# Patient Record
Sex: Female | Born: 1970 | Race: White | Hispanic: No | Smoking: Never smoker
Health system: Southern US, Community
[De-identification: ages and names within clinical notes are randomized; demographics above are authoritative.]

## PROBLEM LIST (undated history)

## (undated) DIAGNOSIS — Z9289 Personal history of other medical treatment: Secondary | ICD-10-CM

## (undated) DIAGNOSIS — F329 Major depressive disorder, single episode, unspecified: Secondary | ICD-10-CM

## (undated) DIAGNOSIS — T7840XA Allergy, unspecified, initial encounter: Secondary | ICD-10-CM

## (undated) DIAGNOSIS — I10 Essential (primary) hypertension: Secondary | ICD-10-CM

## (undated) DIAGNOSIS — F419 Anxiety disorder, unspecified: Secondary | ICD-10-CM

## (undated) DIAGNOSIS — Q210 Ventricular septal defect: Secondary | ICD-10-CM

## (undated) DIAGNOSIS — F32A Depression, unspecified: Secondary | ICD-10-CM

## (undated) DIAGNOSIS — D649 Anemia, unspecified: Secondary | ICD-10-CM

## (undated) DIAGNOSIS — R011 Cardiac murmur, unspecified: Secondary | ICD-10-CM

## (undated) HISTORY — DX: Anemia, unspecified: D64.9

## (undated) HISTORY — DX: Anxiety disorder, unspecified: F41.9

## (undated) HISTORY — DX: Ventricular septal defect: Q21.0

## (undated) HISTORY — DX: Personal history of other medical treatment: Z92.89

## (undated) HISTORY — PX: OTHER SURGICAL HISTORY: SHX169

## (undated) HISTORY — DX: Allergy, unspecified, initial encounter: T78.40XA

## (undated) HISTORY — DX: Essential (primary) hypertension: I10

## (undated) HISTORY — DX: Depression, unspecified: F32.A

## (undated) HISTORY — PX: WISDOM TOOTH EXTRACTION: SHX21

---
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
## 2005-12-08 ENCOUNTER — Ambulatory Visit: Payer: Self-pay | Admitting: Family Medicine

## 2006-08-29 ENCOUNTER — Ambulatory Visit: Payer: Self-pay | Admitting: Family Medicine

## 2006-10-31 DIAGNOSIS — J45909 Unspecified asthma, uncomplicated: Secondary | ICD-10-CM

## 2007-08-24 ENCOUNTER — Ambulatory Visit: Payer: Self-pay | Admitting: Cardiovascular Disease

## 2007-10-01 ENCOUNTER — Ambulatory Visit: Payer: Self-pay

## 2007-10-01 ENCOUNTER — Encounter: Payer: Self-pay | Admitting: Cardiovascular Disease

## 2007-10-09 ENCOUNTER — Ambulatory Visit: Payer: Self-pay | Admitting: Internal Medicine

## 2007-10-09 DIAGNOSIS — L723 Sebaceous cyst: Secondary | ICD-10-CM

## 2007-11-05 ENCOUNTER — Ambulatory Visit: Payer: Self-pay | Admitting: Cardiovascular Disease

## 2007-11-05 LAB — CONVERTED CEMR LAB
BUN: 7 mg/dL (ref 6–23)
CO2: 28 meq/L (ref 19–32)
Chloride: 111 meq/L (ref 96–112)
GFR calc Af Amer: 103 mL/min
Glucose, Bld: 88 mg/dL (ref 70–99)
Potassium: 4 meq/L (ref 3.5–5.1)

## 2007-12-18 ENCOUNTER — Telehealth (INDEPENDENT_AMBULATORY_CARE_PROVIDER_SITE_OTHER): Payer: Self-pay | Admitting: *Deleted

## 2007-12-19 ENCOUNTER — Ambulatory Visit: Payer: Self-pay | Admitting: Family Medicine

## 2007-12-25 ENCOUNTER — Telehealth: Payer: Self-pay | Admitting: Family Medicine

## 2008-05-14 ENCOUNTER — Ambulatory Visit: Payer: Self-pay | Admitting: Cardiovascular Disease

## 2008-05-14 LAB — CONVERTED CEMR LAB
BUN: 9 mg/dL (ref 6–23)
CO2: 27 meq/L (ref 19–32)
Chloride: 108 meq/L (ref 96–112)
Creatinine, Ser: 0.7 mg/dL (ref 0.4–1.2)
Glucose, Bld: 87 mg/dL (ref 70–99)
Potassium: 4.1 meq/L (ref 3.5–5.1)

## 2008-05-20 ENCOUNTER — Ambulatory Visit: Payer: Self-pay | Admitting: Cardiovascular Disease

## 2008-05-20 ENCOUNTER — Ambulatory Visit (HOSPITAL_COMMUNITY): Admission: RE | Admit: 2008-05-20 | Discharge: 2008-05-20 | Payer: Self-pay | Admitting: Cardiovascular Disease

## 2008-06-03 ENCOUNTER — Ambulatory Visit: Payer: Self-pay | Admitting: Family Medicine

## 2008-06-03 DIAGNOSIS — L509 Urticaria, unspecified: Secondary | ICD-10-CM

## 2008-06-04 ENCOUNTER — Telehealth: Payer: Self-pay | Admitting: Family Medicine

## 2009-02-10 ENCOUNTER — Telehealth: Payer: Self-pay | Admitting: Cardiovascular Disease

## 2009-02-12 ENCOUNTER — Ambulatory Visit: Payer: Self-pay | Admitting: Family Medicine

## 2009-02-12 DIAGNOSIS — N61 Mastitis without abscess: Secondary | ICD-10-CM | POA: Insufficient documentation

## 2010-03-08 ENCOUNTER — Encounter: Payer: Self-pay | Admitting: Cardiovascular Disease

## 2010-03-10 ENCOUNTER — Encounter: Payer: Self-pay | Admitting: Family Medicine

## 2010-03-11 ENCOUNTER — Other Ambulatory Visit: Payer: Self-pay | Admitting: Family Medicine

## 2010-03-11 ENCOUNTER — Ambulatory Visit
Admission: RE | Admit: 2010-03-11 | Discharge: 2010-03-11 | Payer: Self-pay | Source: Home / Self Care | Attending: Family Medicine | Admitting: Family Medicine

## 2010-03-12 DIAGNOSIS — L820 Inflamed seborrheic keratosis: Secondary | ICD-10-CM | POA: Insufficient documentation

## 2010-03-18 NOTE — Assessment & Plan Note (Signed)
Summary: check suspicious growth on pts back/cjr   Vital Signs:  Patient profile:   40 year old female Height:      63 inches Weight:      168 pounds BMI:     29.87 Temp:     98.3 degrees F oral BP sitting:   120 / 88  (left arm) Cuff size:   regular  Vitals Entered By: Kern Reap CMA Duncan Dull) (March 11, 2010 1:57 PM)  Procedure Note Last Tetanus: Historical (12/08/2005)  Mole Biopsy/Removal: Indication: inflamed lesion Consent signed: yes  Procedure # 1: elliptical incision with 2 mm margin    Size (in cm): 1.0 x 1.0    Region: posterior    Location: back-mid-right    Instrument used: #15 blade    Anesthesia: 1% lidocaine w/epinephrine    Closure: cautery  Cleaned and prepped with: alcohol Wound dressing: neosporin and bandaid  CC: lession on back   CC:  lession on back.  History of Present Illness: mckinzi is a 40 year old female, who comes in today for removal of a irritated lesion on her back.  Her boyfriend noticed this lesion about 3 or 4 weeks ago.  Her father was diagnosed to have metastatic melanoma about a year and a half ago.  Review of systems negative except she did spend some [place] on [address] and she does have light skin and blue eyes  Allergies: 1)  ! Tetracycline 2)  ! Erythromycin 3)  ! * Ct Dye   Complete Medication List: 1)  Ventolin Hfa 108 (90 Base) Mcg/act Aers (Albuterol sulfate) .... 2 puffs every 4 hours as needed 2)  Prednisone 20 Mg Tabs (Prednisone) .... Uad 3)  Keflex 500 Mg Caps (Cephalexin) .... 2 by mouth two times a day  Other Orders: Shave Skin Lesion 1.1-2.0 cm/trunk/arm/leg (91478)   Orders Added: 1)  Shave Skin Lesion 1.1-2.0 cm/trunk/arm/leg [[id number]]

## 2010-03-24 NOTE — Miscellaneous (Signed)
Summary: Consent to Office Procedure  Consent to Office Procedure   Imported By: Maryln Gottron 03/19/2010 12:29:57  _____________________________________________________________________  External Attachment:    Type:   Image     Comment:   External Document

## 2010-05-08 ENCOUNTER — Other Ambulatory Visit: Payer: Self-pay | Admitting: Family Medicine

## 2010-05-10 NOTE — Telephone Encounter (Signed)
Keflex for what reason??????????

## 2010-06-29 NOTE — Assessment & Plan Note (Signed)
[facility]                            CARDIOLOGY OFFICE NOTE   CHERELLE, MIDKIFF                            MRN:          [id number]  DATE:08/24/2007                            DOB:          20-Jun-1970    HISTORY OF PRESENT ILLNESS:  Mrs. Mangini is a pleasant patient that is  self-referred and also referred by Dr. Alonza Smoker for murmur and a VSD.  She is 40 years old.  Date of birth [birth year].   She has lifelong history of a murmur.  She carries a diagnosis of VSD.  She has not had an echo in 7 or 8 years, and she was told that it was  small and did not need surgery.   She continues to go to the dentist on a regular basis.  She continues to  use SBE prophylaxis.  Despite the new recommendations, I did tell the  patient that I do prefer VSD patients to be on antibiotic prophylaxis,  since they have a high velocity jet and significant ability to denude  the endothelium in the right ventricle.   From a cardiac standpoint, she has been stable.  She kick-boxes twice a  week.  She has not had PND, orthopnea, palpitations, chest pain, or  presyncope.   She has not had any recurrent fevers __________ SBE.   She seems to understand the natural history of her VSD.  I showed her a  heart model and maneuvered the parallel circulations in the right and  left side of the heart and showed her where her VSD likely is.  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  I do not have any  records from [facility] on her.   REVIEW OF SYSTEMS:  Remarkable for some chronic back pain, but otherwise  no significant problems.  She has had some moles removed.   PAST MEDICAL HISTORY:  Otherwise remarkable for chronic back problems  with what sounds like scoliosis with some sun sensitivity and rosacea to  the cheeks.   Otherwise negative.   The patient is allergic to TETRACYCLINE and ERYTHROMYCIN.  She takes no  medications regularly.   SOCIAL HISTORY:  The  patient is divorced.  She has no children.  She is  a Emergency planning/management officer for [employer].  She kick-boxes twice a  week.  She does not smoke or drink.   She walks on a regular basis and does 2 hours of kick boxing a day.   Family history is noncontributory.  Mother and father are still alive.  She only takes iron on occasion.   PHYSICAL EXAMINATION:  GENERAL:  The patient's exam is remarkable for  healthy-appearing middle aged white female in no distress.  She does have  a bit of injection to her cheeks; I do not think this reflects mitral  disease.  VITAL SIGNS:  Blood pressure is 110/70, pulses 60 and regular,  respiratory rate 14, afebrile.  HEENT:  Unremarkable.  NECK:  Carotids normal without bruit.  No lymphadenopathy and no JVP  elevation.  CHEST:  She  has a loud VSD murmur that radiates throughout the chest.  PMI is normal.  No RV heaves.  ABDOMEN:  Benign. Bowel sounds positive.  No AAA.  No tenderness.  No  hepatosplenomegaly or hepatojugular reflux.  EXTREMITIES:  Distal pulses are intact.  No edema.  NEURO:  Nonfocal.  SKIN:  Warm and dry.  MUSCULOSKELETAL:  No muscular weakness.  She does have significant  scoliosis and spinal deformity.   EKG shows a sinus rhythm with rightward axis.   IMPRESSION:  1. Murmur.  Followup 2D echocardiogram.  2. Ventricular septal defect.  No evidence of Eisenmenger syndrome or      right heart problems.  The patient will continue subacute bacterial      endocarditis prophylaxis per my request.  Natural history of the      ventriculoseptal defect was discussed with the patient.  Warning      signs in regards to presyncope, dyspnea, and chest pain were also      discussed.  She will continue to try to improve her activity levels      with kick-boxing.  3. Dental hygiene.  Continue to follow up with the dentist twice a      year.  Her dentition seems to be in good shape.  Continue subacute      bacterial endocarditis  prophylaxis.  4. Spinal deformities.  Continue to take p.r.n. nonsteroidals.  Follow      up with primary care M.D.   I will see Markeita on a yearly basis as along as her echo was __________  which should be a small perimembranous VSD with no right-sided  enlargement or evidence of Eisenmenger syndrome.     Noralyn Pick. Eden Emms, MD, [facility]  Electronically Signed    PCN/MedQ  DD: 08/24/2007  DT: 08/25/2007  Job #: [id number]   cc:   Tinnie Gens A. Tawanna Cooler, MD

## 2010-07-02 NOTE — Assessment & Plan Note (Signed)
[facility] OFFICE NOTE   GEORGIAN, MCCLORY                            MRN:          [id number]  DATE:12/08/2005                            DOB:          Sep 17, 1970    This is the first [facility] visit for this 40 year old, divorced, white  female who comes in today for new patient for evaluation of 3 issues.   PAST MEDICAL HISTORY:  None.   PAST HOSPITALIZATIONS:  None.   She had outpatient surgery, cyst on her left wrist.   PAST ILLNESSES:  None.   PAST INJURIES:  None.   DRUG ALLERGIES:  TETRACYCLINE GIVES HER HIVES.  SHE HAS GI UPSET AND  VOMITING TO ERYTHROMYCIN.   She is a nonsmoker.  She takes an occasional drink of alcohol.   She is not taking medicines on a regular basis.   The 3 issues she wants to discuss are moles.  She has some concerns about  some of her moles.  She also wants to discuss the fact that her mother and  aunt have been diagnosed with sarcoidosis, and what that means to her.  She  also has a bump on her face she is concerned about.   REVIEW OF SYMPTOMS:  HEENT:  Pertinent.  She had lacerations to her skull x3  from injury as a child.  No neurologic sequelae.  She does wear reading and  distance glasses.  She gets regular dental care.  CARDIOPULMONARY:  Significant, in she was diagnosed of a VSD at birth.  She is followed by  Cardiology in [place]; just recently moved here.  We will get her set up  to see Dr. Geralynn Rile for evaluation.  GI:  Negative.  GU:  Negative.  GYN:  Gravida 0, para 0.  CPX by GYN December 2005 in [place], including Pap,  was negative.  ENDOCRINE:  Negative.  Weight steady.  MUSCULOSKELETAL:  Negative, except she says she has some defects in some of the bones in her  back.  She occasionally has to see a chiropractor for back pain.  VASCULAR:  Negative.  She does have asthma that is related to cold and exercise.  She  uses an occasional shot  of Albuterol.  Rest of the review of systems  negative.   SOCIAL HISTORY:  She is originally from [place], went to [place].  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  She is divorced, no kids.   FAMILY HISTORY:  Dad is in his 63's, had 1st MI at 69.  He is a smoker.  Hypertension and high cholesterol.  Mother is 29 and has a history of  sarcoidosis, otherwise in good health.  One brother in good health, no  problems.   IMMUNIZATIONS:  Last tetanus unknown, probably more than 10 years.  We will  give her a tetanus booster today.   PHYSICAL EXAMINATION:  VITAL SIGNS:  Height 5 foot 6 inches.  Weight 152.  Pulse is 70 and regular.  Temperature was  98.  BP not recorded.  GENERAL:  She is a well-developed, well-nourished, white female, in no acute  distress.  SKIN:  Was all normal.  She has a garden variety of freckles, moles,  seborrheic keratoses, skin tags and capillary hemangioma, all of which were  normal.  She has what she thought was a mole on her left cheek, which is a  sebaceous cyst.  After a prep, the plug was removed and the sebaceous was  excised via manual pressure.  CARDIAC:  Shows a loud to and fro murmur heard throughout the precordium  consistent with a VSD.   Patient was given an adult DT to catch her up on her vaccinations.  She was  given a slip for a chest x-ray for screening for sarcoidosis, although she  is asymptomatic.  She was reassured that all of her moles were normal.  The  sebaceous cyst was excised.  She will be set up to see Dr. Geralynn Rile for  evaluation of the VSD.   She has a history of exercise and cold induced asthma.  Continue the  Albuterol prophylactically.   Patient was also advised according to the new criteria, she also falls into  one of the four categories; i.e., structural cardiac defect that would  require continued prophylaxis for any dental work, etc.  Patient understood  and  acknowledges that.    ______________________________  Eugenio Hoes. Tawanna Cooler, MD    JAT/MedQ  DD: 12/08/2005  DT: 12/09/2005  Job #: [id number]   cc:   Salvadore Farber, MD

## 2010-07-02 NOTE — Letter (Signed)
November 30, 2007     RE:  Elizabeth, Lozano  MRN:  [id number]  /  DOB:  1970-02-17   To Whom It May Concern:   This letter is written on behalf of Elizabeth Lozano, member ID [id number].  This is the second letter I have written in regards to scheduling the  patient for a cardiac CT.   The patient was initially referred to me for history of a congenital  heart condition, which included a ventricular septal defect.  The  patient has a loud murmur on exam.  We did a 2-D echocardiogram, which  documented a ventricular septal defect, but also questioned whether she  had an additional left-to-right shunt in the form of a coronary fistula  to the RV outflow tract.  This is an extremely important distinction to  make in regards to her long-term prognosis having 2 separate etiologies  to the left-to-right shunt would make her prognosis worse and give her  much higher risk of pulmonary hypertension and Eisenmenger syndrome.  The patient does not want to have invasive heart cath.  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  Analysis can be made with cardiac CT.  She  is an excellent candidate for this being fairly thin and having a low  resting heart rate.   Unfortunately, we tried to get this cardiac CT approved and it was  denied.  Clearly the indications from the [employer] of Cardiology  in the American Medical Association indicate that the congenital heart  anomalies are a satisfactory indication for cardiac CT and in particular  coronary artery fistula is a criteria for ordering one.   Unfortunately, we tried to call [employer] at the [phone] number.  There  was no way to actually access a physician to have a peer-to-peer  conference.   I am writing this letter to [facility] to request approval for cardiac CTA.   I will also make formal note that I will hold  Monia Pouch personally  responsible for any deleterious consequences to her health care in the  next 50 years from pulmonary hypertension, shortness of breath, or long-  term cardiac complications due to [employer]'s inability to allow her to have  a appropriate test to diagnose her cardiac condition.   I would appreciate [employer] getting back to assist as soon as possible in  regards to approving this cardiac CTA.      Sincerely,      Noralyn Pick. Eden Emms, MD, [facility]  Electronically Signed    PCN/MedQ  DD: 11/30/2007  DT: 12/01/2007  Job #: [id number]   CC:    [employer]

## 2010-12-10 ENCOUNTER — Ambulatory Visit (INDEPENDENT_AMBULATORY_CARE_PROVIDER_SITE_OTHER): Payer: BC Managed Care – PPO | Admitting: Family Medicine

## 2010-12-10 ENCOUNTER — Encounter: Payer: Self-pay | Admitting: Family Medicine

## 2010-12-10 VITALS — BP 118/80 | HR 96 | Temp 98.7°F | Wt 166.0 lb

## 2010-12-10 DIAGNOSIS — Z9109 Other allergy status, other than to drugs and biological substances: Secondary | ICD-10-CM

## 2010-12-10 DIAGNOSIS — J45901 Unspecified asthma with (acute) exacerbation: Secondary | ICD-10-CM

## 2010-12-10 DIAGNOSIS — J309 Allergic rhinitis, unspecified: Secondary | ICD-10-CM

## 2010-12-10 MED ORDER — HYDROCODONE-HOMATROPINE 5-1.5 MG/5ML PO SYRP: 5.0000 mL | ORAL_SOLUTION | ORAL | Status: AC | PRN

## 2010-12-10 MED ORDER — PREDNISONE (PAK) 10 MG PO TABS: ORAL_TABLET | ORAL | Status: DC

## 2010-12-10 NOTE — Progress Notes (Signed)
  Subjective:    Patient ID: Elizabeth Lozano, female    DOB: 03/28/1970, 40 y.o.   MRN: [id number]  HPI Here for one month of constant PND, tickling in the throat, chest congestion,and a dry cough. No ST or sinus pressure or fever. She is using an OTC antihistamine and saline nasal sprays.    Review of Systems  Constitutional: Negative.   HENT: Positive for postnasal drip. Negative for congestion and sinus pressure.   Eyes: Negative.   Respiratory: Positive for cough.        Objective:   Physical Exam  Constitutional: She appears well-developed.  HENT:  Right Ear: External ear normal.  Left Ear: External ear normal.  Nose: Nose normal.  Mouth/Throat: Oropharynx is clear and moist. No oropharyngeal exudate.  Eyes: Conjunctivae are normal. Pupils are equal, round, and reactive to light.  Neck: No thyromegaly present.  Pulmonary/Chest: Effort normal and breath sounds normal.  Lymphadenopathy:    She has no cervical adenopathy.          Assessment & Plan:  Use Prednisone to calm her immune system down. Recheck prn

## 2010-12-20 ENCOUNTER — Telehealth: Payer: Self-pay | Admitting: Cardiovascular Disease

## 2010-12-20 DIAGNOSIS — R011 Cardiac murmur, unspecified: Secondary | ICD-10-CM

## 2010-12-20 NOTE — Telephone Encounter (Signed)
Left message for pt, she does need an echo sameday as appt with nishan. She will call back to schedule  Elizabeth Lozano

## 2010-12-20 NOTE — Telephone Encounter (Signed)
Pt calling to make fu appt with dr Eden Emms and wants to know if needs echo at the same time?

## 2011-01-31 ENCOUNTER — Ambulatory Visit: Payer: BC Managed Care – PPO | Admitting: Cardiology

## 2011-02-01 ENCOUNTER — Ambulatory Visit: Payer: BC Managed Care – PPO | Admitting: Cardiovascular Disease

## 2011-02-01 ENCOUNTER — Other Ambulatory Visit (HOSPITAL_COMMUNITY): Payer: BC Managed Care – PPO | Admitting: Radiology

## 2011-02-21 ENCOUNTER — Other Ambulatory Visit (HOSPITAL_COMMUNITY): Payer: BC Managed Care – PPO | Admitting: Radiology

## 2011-02-21 ENCOUNTER — Ambulatory Visit: Payer: BC Managed Care – PPO | Admitting: Cardiovascular Disease

## 2011-03-09 ENCOUNTER — Encounter: Payer: Self-pay | Admitting: Cardiovascular Disease

## 2011-03-09 ENCOUNTER — Ambulatory Visit (INDEPENDENT_AMBULATORY_CARE_PROVIDER_SITE_OTHER): Payer: BC Managed Care – PPO | Admitting: Cardiovascular Disease

## 2011-03-09 ENCOUNTER — Ambulatory Visit (HOSPITAL_COMMUNITY): Payer: BC Managed Care – PPO | Attending: Cardiology | Admitting: Radiology

## 2011-03-09 VITALS — BP 124/83 | HR 64 | Ht 66.0 in | Wt 168.0 lb

## 2011-03-09 DIAGNOSIS — Q210 Ventricular septal defect: Secondary | ICD-10-CM

## 2011-03-09 DIAGNOSIS — J45909 Unspecified asthma, uncomplicated: Secondary | ICD-10-CM

## 2011-03-09 DIAGNOSIS — R011 Cardiac murmur, unspecified: Secondary | ICD-10-CM

## 2011-03-09 NOTE — Assessment & Plan Note (Signed)
PRN inhaler no wheezing today

## 2011-03-09 NOTE — Patient Instructions (Signed)
Your physician wants you to follow-up in: YEAR WITH DR NISHAN  You will receive a reminder letter in the mail two months in advance. If you don't receive a letter, please call our office to schedule the follow-up appointment.  Your physician recommends that you continue on your current medications as directed. Please refer to the Current Medication list given to you today. 

## 2011-03-09 NOTE — Assessment & Plan Note (Signed)
No change on echo.  Larger than typical PMVSD.  SBE per patient.  F/U in a year

## 2011-03-09 NOTE — Progress Notes (Signed)
Patient ID: Elizabeth Lozano, female   DOB: 1970-09-25, 41 y.o.   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  Compared echo from today with 3 years ago and no change.  Slightly bigger than usual PMVSD but no RV dilatation or evidence of pulmonary hypertension.  She is asymptomatic.  Prefers to take SBE  Sees dentist twice a year.  No dyspnea, palpitations, syncope of SSCP.  Active no exercise intolerance.  No fevers of unknown origin  ROS: Denies fever, malais, weight loss, blurry vision, decreased visual acuity, cough, sputum, SOB, hemoptysis, pleuritic pain, palpitaitons, heartburn, abdominal pain, melena, lower extremity edema, claudication, or rash.  All other systems reviewed and negative  General: Affect appropriate Healthy:  appears stated age HEENT: normal Neck supple with no adenopathy JVP normal no bruits no thyromegaly Lungs clear with no wheezing and good diaphragmatic motion Heart:  S1/S2 Loud VSD murmur,rub, gallop or click PMI normal Abdomen: benighn, BS positve, no tenderness, no AAA no bruit.  No HSM or HJR Distal pulses intact with no bruits No edema Neuro non-focal Skin warm and dry No muscular weakness   Current Outpatient Prescriptions  Medication Sig Dispense Refill  . albuterol (PROVENTIL HFA;VENTOLIN HFA) 108 (90 BASE) MCG/ACT inhaler Inhale 2 puffs into the lungs every 6 (six) hours as needed.        . Multiple Vitamin (MULTIVITAMIN) capsule Take 1 capsule by mouth daily.      . NON FORMULARY Birth control 1 tab po qd        Allergies  Erythromycin; Iohexol; and Tetracycline  Electrocardiogram:  SR rate 64 RAD otherwise normal  Assessment and Plan

## 2011-04-22 ENCOUNTER — Telehealth: Payer: Self-pay | Admitting: Family Medicine

## 2011-04-22 NOTE — Telephone Encounter (Signed)
Need cpx and cpx labs before 06-14-11 so she can get discount on insurance. Can she be fit in somewhere? If you can let one of the schedulers know as I won't be back in the office after today.

## 2011-04-25 NOTE — Telephone Encounter (Signed)
Elizabeth Lozano please call her and work her in somewhere

## 2011-04-26 NOTE — Telephone Encounter (Signed)
Called 3/11 & 3/12. LMOM both times.

## 2011-04-28 ENCOUNTER — Encounter: Payer: Self-pay | Admitting: Family Medicine

## 2011-04-28 ENCOUNTER — Ambulatory Visit (INDEPENDENT_AMBULATORY_CARE_PROVIDER_SITE_OTHER): Payer: BC Managed Care – PPO | Admitting: Family Medicine

## 2011-04-28 VITALS — BP 130/90 | Temp 99.1°F | Wt 165.0 lb

## 2011-04-28 DIAGNOSIS — J45909 Unspecified asthma, uncomplicated: Secondary | ICD-10-CM

## 2011-04-28 MED ORDER — PREDNISONE 20 MG PO TABS: ORAL_TABLET | ORAL | Status: DC

## 2011-04-28 NOTE — Patient Instructions (Signed)
Begin the prednisone take 3 tablets now then 2 tabs x3 days or until you feel better and then begin to taper as outlined  Drink lots of water  Hydromet 1/2-1 teaspoon 3 times daily when necessary  Albuterol 1 or 2 puffs 3 times daily when necessary  Return in 2 weeks for followup sooner if any problems

## 2011-04-28 NOTE — Progress Notes (Signed)
  Subjective:    Patient ID: Elizabeth Lozano, female    DOB: 01-17-1971, 41 y.o.   MRN: [id number]  HPI K. is a 41 year old female nonsmoker who comes in today for evaluation of acute asthma  Over the past weekend she was out in the air and this must a trigger for wheezing. It then started on Monday. She's tried the albuterol but it hasn't helped. She did have an episode last fall after a short course of prednisone her wheezing went away.  Recent cardiac evaluation because she has a VSD normal   Review of Systems General and pulmonary review of systems otherwise negative    Objective:   Physical Exam Well-developed well-nourished female in no acute distress HEENT negative neck was supple no adenopathy lungs are clear except for mild to moderate late expiratory wheezing. Cardiac exam shows a grade 3-4 murmur consistent with a VSD       Assessment & Plan:  Asthma plan prednisone burst and taper return when necessary

## 2011-08-19 ENCOUNTER — Ambulatory Visit
Admission: RE | Admit: 2011-08-19 | Discharge: 2011-08-19 | Disposition: A | Discharge status: Home / Self Care | Payer: BC Managed Care – PPO | Source: Ambulatory Visit | Attending: Allergy and Immunology | Admitting: Allergy and Immunology

## 2011-08-19 ENCOUNTER — Other Ambulatory Visit: Payer: Self-pay | Admitting: Allergy and Immunology

## 2011-08-19 DIAGNOSIS — J45909 Unspecified asthma, uncomplicated: Secondary | ICD-10-CM

## 2012-01-17 ENCOUNTER — Other Ambulatory Visit: Payer: Self-pay | Admitting: Obstetrics and Gynecology

## 2012-08-23 ENCOUNTER — Encounter (HOSPITAL_COMMUNITY): Payer: Self-pay | Admitting: *Deleted

## 2012-08-23 ENCOUNTER — Emergency Department (HOSPITAL_COMMUNITY)
Admission: EM | Admit: 2012-08-23 | Discharge: 2012-08-24 | Disposition: A | Discharge status: Home / Self Care | Payer: BC Managed Care – PPO | Attending: Emergency Medicine | Admitting: Emergency Medicine

## 2012-08-23 ENCOUNTER — Telehealth: Payer: Self-pay | Admitting: Family Medicine

## 2012-08-23 DIAGNOSIS — R011 Cardiac murmur, unspecified: Secondary | ICD-10-CM | POA: Insufficient documentation

## 2012-08-23 DIAGNOSIS — Z79899 Other long term (current) drug therapy: Secondary | ICD-10-CM | POA: Insufficient documentation

## 2012-08-23 DIAGNOSIS — R0789 Other chest pain: Secondary | ICD-10-CM

## 2012-08-23 DIAGNOSIS — R209 Unspecified disturbances of skin sensation: Secondary | ICD-10-CM | POA: Insufficient documentation

## 2012-08-23 DIAGNOSIS — J45909 Unspecified asthma, uncomplicated: Secondary | ICD-10-CM | POA: Insufficient documentation

## 2012-08-23 HISTORY — DX: Cardiac murmur, unspecified: R01.1

## 2012-08-23 LAB — CBC WITH DIFFERENTIAL/PLATELET
Basophils Absolute: 0.1 10*3/uL (ref 0.0–0.1)
Eosinophils Absolute: 0.1 10*3/uL (ref 0.0–0.7)
Eosinophils Relative: 2 % (ref 0–5)
HCT: 32.6 % — ABNORMAL LOW (ref 36.0–46.0)
MCH: 23.7 pg — ABNORMAL LOW (ref 26.0–34.0)
MCHC: 31.6 g/dL (ref 30.0–36.0)
MCV: 74.9 fL — ABNORMAL LOW (ref 78.0–100.0)
Monocytes Absolute: 0.6 10*3/uL (ref 0.1–1.0)
Platelets: 356 10*3/uL (ref 150–400)
RDW: 15.6 % — ABNORMAL HIGH (ref 11.5–15.5)

## 2012-08-23 LAB — BASIC METABOLIC PANEL
CO2: 24 mEq/L (ref 19–32)
Calcium: 9.4 mg/dL (ref 8.4–10.5)
Creatinine, Ser: 0.82 mg/dL (ref 0.50–1.10)
GFR calc non Af Amer: 86 mL/min — ABNORMAL LOW (ref >90)
Sodium: 138 mEq/L (ref 135–145)

## 2012-08-23 LAB — POCT I-STAT TROPONIN I: Troponin i, poc: 0 ng/mL (ref 0.00–0.08)

## 2012-08-23 NOTE — ED Notes (Signed)
ekg shown to Dr Radford Pax; no orders given

## 2012-08-23 NOTE — ED Notes (Signed)
Pt states she had a stressful day at work; had two episodes of sharp chest pain and left arm numbness; states almost felt like panic attack; states when climbing stairs at home chest pain returned; tingling left arm

## 2012-08-23 NOTE — Telephone Encounter (Signed)
Pt was transferred to triage, but hung up, would not wait.  Called back. Pt has experienced a small angina pain. Pt has a heart murmer and thinks its associated w/ that. Would like to rule any thing else out.Asked about a stress test.  Her cardiologist could not see her until end of August. Pls advise. Pt not to speak until after 5pm.

## 2012-08-24 ENCOUNTER — Emergency Department (HOSPITAL_COMMUNITY): Payer: BC Managed Care – PPO

## 2012-08-24 NOTE — ED Provider Notes (Addendum)
History    CSN: [id number] Arrival date & time 08/23/12  2213  First MD Initiated Contact with Patient 08/23/12 2355     Chief Complaint  Patient presents with  . Chest Pain   (Consider location/radiation/quality/duration/timing/severity/associated sxs/prior Treatment) HPI 42 yo female presents to the ER from home with complaint of chest pain.  Pt reports first episode this morning around 11 am.  Pt reports just after being yelled at while at work she had sharp pain to her left chest and tingling to her left arm.  Sxs lasted a few seconds.  She had second episode later in the morning, also associated with stressful situation.  She reports she has had persistent tingling in her left arm through the day.  She denies any nausea, diaphoresis, or sob different from her baseline sob from her asthma.  Tonight while walking up and down the stairs multiple times she had some slight chest pressure and arm tingling.  All symptoms went away when she had her EKG and the nurse told her it looked normal.  Pt has h/o VSD.  She is followed by [facility] cardiology.  No personal h/o CAD.  No htn, hyperlipidemia, not a smoker.  Father had MI at 33.    Past Medical History  Diagnosis Date  . Asthma   . Heart murmur    Past Surgical History  Procedure Laterality Date  . Cyst left wrist     Family History  Problem Relation Age of Onset  . Sarcoidosis Mother   . Heart attack Father   . Hypertension Father   . Hyperlipidemia Father   . Healthy Brother   . Alcohol abuse Other   . Arthritis Other   . Hyperlipidemia Other   . Hypertension Other   . Cancer Other     prostate  . Heart disease Other   . Melanoma Other    History  Substance Use Topics  . Smoking status: Never Smoker   . Smokeless tobacco: Never Used  . Alcohol Use: 2.0 oz/week    4 drink(s) per week   OB History   Grav Para Term Preterm Abortions TAB SAB Ect Mult Living                 Review of Systems  All other systems  reviewed and are negative.    Allergies  Erythromycin; Iohexol; and Tetracycline  Home Medications   Current Outpatient Rx  Name  Route  Sig  Dispense  Refill  . beclomethasone (QVAR) 80 MCG/ACT inhaler   Inhalation   Inhale 2 puffs into the lungs 2 (two) times daily.         . Ferrous Fumarate-DSS TABS   Oral   Take 1 tablet by mouth as needed. She takes it with her cycle         . levalbuterol (XOPENEX HFA) 45 MCG/ACT inhaler   Inhalation   Inhale 1-2 puffs into the lungs every 6 (six) hours as needed for wheezing or shortness of breath.          BP 131/85  Pulse 73  Temp(Src) 98.4 F (36.9 C)  Resp 18  SpO2 100%  LMP 08/09/2012 Physical Exam  Nursing note and vitals reviewed. Constitutional: She is oriented to person, place, and time. She appears well-developed and well-nourished.  HENT:  Head: Normocephalic and atraumatic.  Right Ear: External ear normal.  Left Ear: External ear normal.  Nose: Nose normal.  Mouth/Throat: Oropharynx is clear and moist.  Eyes:  Conjunctivae and EOM are normal. Pupils are equal, round, and reactive to light.  Neck: Normal range of motion. Neck supple. No JVD present. No tracheal deviation present. No thyromegaly present.  Cardiovascular: Normal rate, regular rhythm and intact distal pulses.  Exam reveals no gallop and no friction rub.   Murmur (loud holosystolic mumur noted) heard. Pulmonary/Chest: Effort normal and breath sounds normal. No stridor. No respiratory distress. She has no wheezes. She has no rales. She exhibits no tenderness.  Abdominal: Soft. Bowel sounds are normal. She exhibits no distension and no mass. There is no tenderness. There is no rebound and no guarding.  Musculoskeletal: Normal range of motion. She exhibits no edema and no tenderness.  Lymphadenopathy:    She has no cervical adenopathy.  Neurological: She is oriented to person, place, and time. She has normal reflexes. No cranial nerve deficit. She  exhibits normal muscle tone. Coordination normal.  Skin: Skin is dry. No rash noted. No erythema. No pallor.  Psychiatric: She has a normal mood and affect. Her behavior is normal. Judgment and thought content normal.    ED Course  Procedures (including critical care time) Labs Reviewed  CBC WITH DIFFERENTIAL - Abnormal; Notable for the following:    Hemoglobin 10.3 (*)    HCT 32.6 (*)    MCV 74.9 (*)    MCH 23.7 (*)    RDW 15.6 (*)    All other components within normal limits  BASIC METABOLIC PANEL - Abnormal; Notable for the following:    Glucose, Bld 101 (*)    GFR calc non Af Amer 86 (*)    All other components within normal limits  POCT I-STAT TROPONIN I  POCT I-STAT TROPONIN I   Dg Chest Port 1 View  08/24/2012   *RADIOLOGY REPORT*  Clinical Data: Chest pain for 24 hours.  PORTABLE CHEST - 1 VIEW  Comparison: 08/19/2011  Findings: Shallow inspiration.  Normal heart size.  Prominence of the pulmonary outflow tract with mild prominence of pulmonary arteries.  The patient has a history of VSD, accounting for this change.  No focal consolidation or airspace disease.  No blunting of costophrenic angles.  No pneumothorax.  Mediastinal contours appear intact.  No significant change since previous study.  IMPRESSION: No evidence of active pulmonary disease.   Original Report Authenticated By: Burman Nieves, M.D.    Date: 08/24/2012  Rate: 87  Rhythm: normal sinus rhythm  QRS Axis: right  Intervals: normal  ST/T Wave abnormalities: nonspecific T wave changes  Conduction Disutrbances:none  Narrative Interpretation:   Old EKG Reviewed: none available   1. Atypical chest pain     MDM  42 yo female with atypical chest pain symptoms.  She has some risk factors, and has never had risk stratification done.  Workup here unremarkable.  D/w Dr Terressa Koyanagi on call with cardiology who will have [place] cards contact her for close f/u.  Pt is comfortable with this plan.  Olivia Mackie,  MD 08/24/12 1610  Olivia Mackie, MD 08/24/12 1726

## 2012-08-24 NOTE — Telephone Encounter (Signed)
Spoke with patient and she has spoken with cardiologist.

## 2012-09-10 ENCOUNTER — Ambulatory Visit: Payer: BC Managed Care – PPO | Admitting: Family Medicine

## 2012-09-17 ENCOUNTER — Encounter: Payer: Self-pay | Admitting: Family

## 2012-09-17 ENCOUNTER — Ambulatory Visit (INDEPENDENT_AMBULATORY_CARE_PROVIDER_SITE_OTHER): Payer: BC Managed Care – PPO | Admitting: Family

## 2012-09-17 VITALS — BP 120/80 | HR 65 | Wt 166.0 lb

## 2012-09-17 DIAGNOSIS — R0789 Other chest pain: Secondary | ICD-10-CM

## 2012-09-17 DIAGNOSIS — Q210 Ventricular septal defect: Secondary | ICD-10-CM

## 2012-09-18 ENCOUNTER — Encounter: Payer: Self-pay | Admitting: Family

## 2012-09-18 NOTE — Progress Notes (Signed)
Subjective:    Patient ID: Elizabeth Lozano, female    DOB: 1970/08/25, 42 y.o.   MRN: [id number]  HPI 42 year old white female, nonsmoker, patient of Dr. Tawanna Cooler is in today as a hospital followup. She was seen in the emergency department on 08/23/2012 with complaints of chest pain, elevated blood pressure, and pain in her left arm. The pain appeared to be more pronounced with going up steps and with stressful situations. Patient reports that she had a bad day at work that particular day when the pain began. She rates the pain at 5 at 10, not reproducible to palpation. Since that time, she has not had any chest pain at all. She has a history of a heart murmur and had followed up with cardiology regularly for echocardiograms. She has not seen them in approximately 2 years. She had made some lifestyle changes since this incident to include exercising and weight reduction. She's lost approximately 7 pounds. Father had an MI at age 8.   Review of Systems  Constitutional: Negative.   HENT: Negative.   Respiratory: Negative.   Cardiovascular: Positive for chest pain. Negative for palpitations and leg swelling.  Gastrointestinal: Negative.   Endocrine: Negative.   Genitourinary: Negative.   Musculoskeletal: Negative.   Skin: Negative.   Neurological: Negative.   Hematological: Negative.   Psychiatric/Behavioral: Positive for agitation. Negative for hallucinations, confusion and dysphoric mood. The patient is nervous/anxious.    Past Medical History  Diagnosis Date  . Asthma   . Heart murmur     History   Social History  . Marital Status: Divorced    Spouse Name: N/A    Number of Children: N/A  . Years of Education: N/A   Occupational History  . Not on file.   Social History Main Topics  . Smoking status: Never Smoker   . Smokeless tobacco: Never Used  . Alcohol Use: 2.0 oz/week    4 drink(s) per week  . Drug Use: No  . Sexually Active: Not on file   Other Topics Concern  . Not on file    Social History Narrative  . No narrative on file    Past Surgical History  Procedure Laterality Date  . Cyst left wrist      Family History  Problem Relation Age of Onset  . Sarcoidosis Mother   . Heart attack Father   . Hypertension Father   . Hyperlipidemia Father   . Healthy Brother   . Alcohol abuse Other   . Arthritis Other   . Hyperlipidemia Other   . Hypertension Other   . Cancer Other     prostate  . Heart disease Other   . Melanoma Other     Allergies  Allergen Reactions  . Erythromycin Nausea And Vomiting  . Iohexol      Code: HIVES, Desc: RN AMY NOTICED HIVE ON STOMACH AFTER HEART SCAN. ALSO ONE ON BACK. NOT ITCHING UNTIL AFTER SHE KNEW OF HIVES. DR.NISHAN NOTIFIED. GIVEN BENADRYL., Onset Date: [id number]   . Tetracycline Hives    Current Outpatient Prescriptions on File Prior to Visit  Medication Sig Dispense Refill  . beclomethasone (QVAR) 80 MCG/ACT inhaler Inhale 2 puffs into the lungs 2 (two) times daily.      . Ferrous Fumarate-DSS TABS Take 1 tablet by mouth as needed. She takes it with her cycle      . levalbuterol (XOPENEX HFA) 45 MCG/ACT inhaler Inhale 1-2 puffs into the lungs every 6 (six) hours as needed for wheezing  or shortness of breath.       No current facility-administered medications on file prior to visit.    BP 120/80  Pulse 65  Wt 166 lb (75.297 kg)  BMI 26.81 kg/m2  LMP 06/26/2014chart    Objective:   Physical Exam  Constitutional: She is oriented to person, place, and time. She appears well-developed and well-nourished.  HENT:  Right Ear: External ear normal.  Left Ear: External ear normal.  Nose: Nose normal.  Mouth/Throat: Oropharynx is clear and moist.  Neck: Normal range of motion. Neck supple. No thyromegaly present.  Cardiovascular: Normal rate and regular rhythm.   Murmur heard. VSD  Pulmonary/Chest: Effort normal and breath sounds normal.  Abdominal: Soft. Bowel sounds are normal.  Musculoskeletal: Normal  range of motion.  Neurological: She is alert and oriented to person, place, and time.  Skin: Skin is warm and dry.  Psychiatric: She has a normal mood and affect.          Assessment & Plan:  Assessment: 1. Chest pain 2. Ventral septal defect  Plan: Continue lifestyle modifications. Refer back to cardiology. Low-cholesterol diet. Followup here for complete physical exam as soon as possible.

## 2012-10-09 ENCOUNTER — Ambulatory Visit (INDEPENDENT_AMBULATORY_CARE_PROVIDER_SITE_OTHER): Payer: BC Managed Care – PPO | Admitting: Family

## 2012-10-09 ENCOUNTER — Encounter: Payer: Self-pay | Admitting: Family

## 2012-10-09 VITALS — BP 120/80 | HR 79 | Ht 65.5 in | Wt 165.0 lb

## 2012-10-09 DIAGNOSIS — Q210 Ventricular septal defect: Secondary | ICD-10-CM

## 2012-10-09 DIAGNOSIS — Z23 Encounter for immunization: Secondary | ICD-10-CM

## 2012-10-09 DIAGNOSIS — Z0000 Encounter for general adult medical examination without abnormal findings: Secondary | ICD-10-CM

## 2012-10-09 LAB — POCT URINALYSIS DIPSTICK
Glucose, UA: NEGATIVE
Ketones, UA: NEGATIVE
Protein, UA: NEGATIVE
Spec Grav, UA: 1.02
Urobilinogen, UA: 0.2

## 2012-10-09 LAB — CBC WITH DIFFERENTIAL/PLATELET
Basophils Relative: 0.3 % (ref 0.0–3.0)
Eosinophils Absolute: 0.1 10*3/uL (ref 0.0–0.7)
Eosinophils Relative: 1.5 % (ref 0.0–5.0)
HCT: 31.8 % — ABNORMAL LOW (ref 36.0–46.0)
Lymphs Abs: 2.9 10*3/uL (ref 0.7–4.0)
MCHC: 32.5 g/dL (ref 30.0–36.0)
MCV: 75.2 fl — ABNORMAL LOW (ref 78.0–100.0)
Monocytes Absolute: 0.4 10*3/uL (ref 0.1–1.0)
Neutrophils Relative %: 53.1 % (ref 43.0–77.0)
RBC: 4.23 Mil/uL (ref 3.87–5.11)

## 2012-10-09 LAB — LIPID PANEL
Cholesterol: 186 mg/dL (ref 0–200)
LDL Cholesterol: 123 mg/dL — ABNORMAL HIGH (ref 0–99)
Triglycerides: 79 mg/dL (ref 0.0–149.0)
VLDL: 15.8 mg/dL (ref 0.0–40.0)

## 2012-10-09 LAB — COMPREHENSIVE METABOLIC PANEL
Alkaline Phosphatase: 51 U/L (ref 39–117)
BUN: 12 mg/dL (ref 6–23)
GFR: 76.34 mL/min (ref >60.00)
Glucose, Bld: 87 mg/dL (ref 70–99)
Total Bilirubin: 0.5 mg/dL (ref 0.3–1.2)

## 2012-10-09 NOTE — Progress Notes (Signed)
Subjective:    Patient ID: Elizabeth Lozano, female    DOB: Sep 15, 1970, 42 y.o.   MRN: [id number]  HPI This is a routine physical examination for this healthy  Female. Reviewed all health maintenance protocols including mammography. Her immunization history was reviewed as well as her current medications and allergies refills of her chronic medications were given and the plan for yearly health maintenance was discussed all orders and referrals were made as appropriate.   Review of Systems  Constitutional: Negative.   HENT: Negative.   Eyes: Negative.   Respiratory: Negative.   Cardiovascular: Negative.   Gastrointestinal: Negative.   Endocrine: Negative.   Genitourinary: Negative.   Musculoskeletal: Negative.   Skin: Negative.   Allergic/Immunologic: Negative.   Neurological: Negative.   Hematological: Negative.   Psychiatric/Behavioral: Negative.    Past Medical History  Diagnosis Date  . Asthma   . Heart murmur     History   Social History  . Marital Status: Divorced    Spouse Name: N/A    Number of Children: N/A  . Years of Education: N/A   Occupational History  . Not on file.   Social History Main Topics  . Smoking status: Never Smoker   . Smokeless tobacco: Never Used  . Alcohol Use: 2.0 oz/week    4 drink(s) per week  . Drug Use: No  . Sexual Activity: Not on file   Other Topics Concern  . Not on file   Social History Narrative  . No narrative on file    Past Surgical History  Procedure Laterality Date  . Cyst left wrist      Family History  Problem Relation Age of Onset  . Sarcoidosis Mother   . Heart attack Father   . Hypertension Father   . Hyperlipidemia Father   . Healthy Brother   . Alcohol abuse Other   . Arthritis Other   . Hyperlipidemia Other   . Hypertension Other   . Cancer Other     prostate  . Heart disease Other   . Melanoma Other     Allergies  Allergen Reactions  . Erythromycin Nausea And Vomiting  . Iohexol      Code:  HIVES, Desc: RN AMY NOTICED HIVE ON STOMACH AFTER HEART SCAN. ALSO ONE ON BACK. NOT ITCHING UNTIL AFTER SHE KNEW OF HIVES. DR.NISHAN NOTIFIED. GIVEN BENADRYL., Onset Date: [id number]   . Tetracycline Hives    Current Outpatient Prescriptions on File Prior to Visit  Medication Sig Dispense Refill  . beclomethasone (QVAR) 80 MCG/ACT inhaler Inhale 2 puffs into the lungs 2 (two) times daily.      . Ferrous Fumarate-DSS TABS Take 1 tablet by mouth as needed. She takes it with her cycle      . JUNEL FE 1/20 1-20 MG-MCG tablet Take 1 tablet by mouth daily.       Marland Kitchen levalbuterol (XOPENEX HFA) 45 MCG/ACT inhaler Inhale 1-2 puffs into the lungs every 6 (six) hours as needed for wheezing or shortness of breath.       No current facility-administered medications on file prior to visit.    BP 120/80  Pulse 79  Ht 5' 5.5" (1.664 m)  Wt 165 lb (74.844 kg)  BMI 27.03 kg/m2chart    Objective:   Physical Exam  Constitutional: She is oriented to person, place, and time. She appears well-developed and well-nourished.  HENT:  Head: Normocephalic and atraumatic.  Right Ear: External ear normal.  Left Ear: External ear normal.  Nose: Nose normal.  Mouth/Throat: Oropharynx is clear and moist.  Eyes: Conjunctivae and EOM are normal. Pupils are equal, round, and reactive to light.  Neck: Normal range of motion. Neck supple.  Cardiovascular: Normal rate and regular rhythm.   Murmur heard. VSD  Pulmonary/Chest: Effort normal and breath sounds normal.  Abdominal: Soft. Bowel sounds are normal. She exhibits no distension. There is no tenderness. There is no rebound.  Musculoskeletal: Normal range of motion.  Neurological: She is oriented to person, place, and time. She has normal reflexes. She displays normal reflexes. No cranial nerve deficit. Coordination normal.  Skin: Skin is warm and dry.  Psychiatric: She has a normal mood and affect.          Assessment & Plan:  Assessment: 1. CPX 2.  VSD  Plan: Last until include BMP, CBC, lipids, TSH, urine, LFTs on the patient and the results. Encouraged healthy diet, exercise, monthly for breast exams. Followup pending results come in one year and sooner as needed. See cardiology as scheduled.

## 2012-10-09 NOTE — Patient Instructions (Addendum)

## 2012-10-10 ENCOUNTER — Ambulatory Visit (INDEPENDENT_AMBULATORY_CARE_PROVIDER_SITE_OTHER): Payer: BC Managed Care – PPO | Admitting: Physician Assistant

## 2012-10-10 ENCOUNTER — Encounter: Payer: Self-pay | Admitting: Physician Assistant

## 2012-10-10 VITALS — BP 114/80 | HR 67 | Ht 66.0 in | Wt 165.0 lb

## 2012-10-10 DIAGNOSIS — R079 Chest pain, unspecified: Secondary | ICD-10-CM

## 2012-10-10 DIAGNOSIS — R0602 Shortness of breath: Secondary | ICD-10-CM

## 2012-10-10 DIAGNOSIS — Q210 Ventricular septal defect: Secondary | ICD-10-CM

## 2012-10-10 NOTE — Progress Notes (Signed)
[address] [place], [place]  [address] Phone: [phone] Fax:  [phone]  Date:  10/10/2012   ID:  Elizabeth Lozano, DOB 1970-07-23, MRN [id number]  PCP:  Evette Georges, MD  Cardiologist:  Dr. Charlton Haws     History of Present Illness: Elizabeth Lozano is a 42 y.o. female who returns for the evaluation of chest pain. She has a history of asthma, perimembranous VSD. Cardiac CTA 05/2008: Membranous VSD, 8-9 mm in diameter, mild RVE, EF 64%, calcium score 0, no CAD. Last echo 02/2011: EF 55-60%, mild CAD, perimembranous VSD slightly more prominent but no RVE or pulmonary hypertension.  Patient had recently noted worsening asthma symptoms. She also returned from a business trip and started to note some left arm tingling as well as left chest discomfort. She became more and more anxious about her symptoms and eventually wentt to the emergency room on 08/23/12. Workup was unremarkable. Recommendation was to follow up with cardiology.  ECG was unremarkable.  Cardiac enzymes remained normal.  Since her visit to the emergency room, she has increased her activity and changed her diet. She overall feels better. She does a lot of aerobic activity in her workouts. She sometimes has some chest discomfort on the left with this. She also notes dyspnea with exertion. Overall, she describes NYHA class II symptoms. She denies orthopnea, PND or syncope.  Labs (8/14):  K 3.9, creatinine 0.9, ALT 15, LDL 123, Hgb 10.3, TSH 3.03  Wt Readings from Last 3 Encounters:  10/10/12 165 lb (74.844 kg)  10/09/12 165 lb (74.844 kg)  09/17/12 166 lb (75.297 kg)     Past Medical History  Diagnosis Date  . Asthma   . VSD (ventricular septal defect)     Cardiac CTA 05/2008: Membranous VSD, 8-9 mm in diameter, mild RVE, EF 64%, calcium score 0, no CAD. Last echo 02/2011: EF 55-60%, mild CAD, perimembranous VSD slightly more prominent but no RVE or pulmonary hypertension    Current Outpatient Prescriptions    Medication Sig Dispense Refill  . beclomethasone (QVAR) 80 MCG/ACT inhaler Inhale 2 puffs into the lungs 2 (two) times daily.      Colleen Can FE 1/20 1-20 MG-MCG tablet Take 1 tablet by mouth daily.       Marland Kitchen levalbuterol (XOPENEX HFA) 45 MCG/ACT inhaler Inhale 1-2 puffs into the lungs every 6 (six) hours as needed for wheezing or shortness of breath.       No current facility-administered medications for this visit.    Allergies:    Allergies  Allergen Reactions  . Erythromycin Nausea And Vomiting  . Iohexol      Code: HIVES, Desc: RN AMY NOTICED HIVE ON STOMACH AFTER HEART SCAN. ALSO ONE ON BACK. NOT ITCHING UNTIL AFTER SHE KNEW OF HIVES. DR.NISHAN NOTIFIED. GIVEN BENADRYL., Onset Date: [id number]   . Tetracycline Hives    Social History:  The patient  reports that she has never smoked. She has never used smokeless tobacco. She reports that she drinks about 2.0 ounces of alcohol per week. She reports that she does not use illicit drugs.   Family History: The patient's family history includes Alcohol abuse in her other; Arthritis in her other; Cancer in her other; Healthy in her brother; Heart attack in her father; Heart disease in her other; Hyperlipidemia in her father and other; Hypertension in her father and other; Melanoma in her other; Sarcoidosis in her mother.    ROS:  Please see the history of present  illness.      All other systems reviewed and negative.   PHYSICAL EXAM: VS:  BP 114/80  Pulse 67  Ht 5\' 6"  (1.676 m)  Wt 165 lb (74.844 kg)  BMI 26.64 kg/m2 Well nourished, well developed, in no acute distress HEENT: normal Neck: no JVD Cardiac:  normal S1, S2; RRR; 3/6 holosystolic murmur loudest along LSB and increases with valsalva maneuver  Lungs:  clear to auscultation bilaterally, no wheezing, rhonchi or rales Abd: soft, nontender, no hepatomegaly Ext: no edema Skin: warm and dry Neuro:  CNs 2-12 intact, no focal abnormalities noted  EKG:  NSR, HR 67, rightward axis,  nonspecific ST-T wave changes     ASSESSMENT AND PLAN:  1. Chest Pain: She has somewhat atypical symptoms. She is able to produce chest symptoms with exercise.  Cardiac CT in 2010 demonstrated no CAD.  Likelihood of development of CAD is low.  However, she is concerned about her significant FHx of CAD (father with MI in mid to late 85s).  Will arrange ETT-Echo. 2. VSD:  She has not had an echo since 02/2011.  She has slightly increased pulmonary artery prominence on recent CXR.  She has noted some mild DOE.  Will arrange follow up Echo and ETT-Echo as noted.   3. Disposition:  Follow up with Dr. Charlton Haws in 3 mos.   Signed, Tereso Newcomer, PA-C  10/10/2012 4:01 PM

## 2012-10-10 NOTE — Patient Instructions (Addendum)
NO MEDICATION CHANGES WERE MADE TODAY  PLEASE SCHEDULE TO HAVE AN ECHO AND ALSO A STRESS ECHO W/O CONTRAST DX 786.50, 786.05, 745.4  PLEASE FOLLOW UP WITH DR. Eden Emms IN 3 MONTHS

## 2012-10-24 ENCOUNTER — Other Ambulatory Visit (HOSPITAL_COMMUNITY): Payer: BC Managed Care – PPO

## 2012-10-26 ENCOUNTER — Ambulatory Visit (HOSPITAL_BASED_OUTPATIENT_CLINIC_OR_DEPARTMENT_OTHER): Payer: BC Managed Care – PPO

## 2012-10-26 ENCOUNTER — Ambulatory Visit (HOSPITAL_COMMUNITY): Payer: BC Managed Care – PPO | Attending: Internal Medicine | Admitting: Radiology

## 2012-10-26 ENCOUNTER — Ambulatory Visit (HOSPITAL_BASED_OUTPATIENT_CLINIC_OR_DEPARTMENT_OTHER): Payer: BC Managed Care – PPO | Admitting: Radiology

## 2012-10-26 DIAGNOSIS — R0989 Other specified symptoms and signs involving the circulatory and respiratory systems: Secondary | ICD-10-CM

## 2012-10-26 DIAGNOSIS — R079 Chest pain, unspecified: Secondary | ICD-10-CM

## 2012-10-26 DIAGNOSIS — Q210 Ventricular septal defect: Secondary | ICD-10-CM

## 2012-10-26 DIAGNOSIS — I079 Rheumatic tricuspid valve disease, unspecified: Secondary | ICD-10-CM | POA: Insufficient documentation

## 2012-10-26 DIAGNOSIS — I059 Rheumatic mitral valve disease, unspecified: Secondary | ICD-10-CM | POA: Insufficient documentation

## 2012-10-26 DIAGNOSIS — I510 Cardiac septal defect, acquired: Secondary | ICD-10-CM | POA: Insufficient documentation

## 2012-10-26 DIAGNOSIS — R072 Precordial pain: Secondary | ICD-10-CM

## 2012-10-26 DIAGNOSIS — Z8249 Family history of ischemic heart disease and other diseases of the circulatory system: Secondary | ICD-10-CM | POA: Insufficient documentation

## 2012-10-26 DIAGNOSIS — R0602 Shortness of breath: Secondary | ICD-10-CM

## 2012-10-26 NOTE — Progress Notes (Signed)
Stress Echocardiogram performed.  

## 2012-10-26 NOTE — Progress Notes (Signed)
Echocardiogram performed.  

## 2012-10-29 ENCOUNTER — Encounter: Payer: Self-pay | Admitting: Physician Assistant

## 2012-10-30 ENCOUNTER — Telehealth: Payer: Self-pay | Admitting: *Deleted

## 2012-10-30 NOTE — Telephone Encounter (Signed)
pt notified about stress echo results with verbal understanding to all results

## 2012-12-20 ENCOUNTER — Other Ambulatory Visit: Payer: Self-pay

## 2013-01-17 ENCOUNTER — Ambulatory Visit: Payer: BC Managed Care – PPO | Admitting: Physician Assistant

## 2013-07-11 ENCOUNTER — Ambulatory Visit (INDEPENDENT_AMBULATORY_CARE_PROVIDER_SITE_OTHER): Payer: BC Managed Care – PPO | Admitting: Family Medicine

## 2013-07-11 ENCOUNTER — Encounter: Payer: Self-pay | Admitting: Family Medicine

## 2013-07-11 VITALS — BP 120/80 | Temp 98.7°F | Wt 173.0 lb

## 2013-07-11 DIAGNOSIS — M25562 Pain in left knee: Secondary | ICD-10-CM

## 2013-07-11 DIAGNOSIS — M25569 Pain in unspecified knee: Secondary | ICD-10-CM

## 2013-07-11 DIAGNOSIS — L739 Follicular disorder, unspecified: Secondary | ICD-10-CM

## 2013-07-11 DIAGNOSIS — L738 Other specified follicular disorders: Secondary | ICD-10-CM

## 2013-07-11 MED ORDER — CEPHALEXIN 500 MG PO CAPS
ORAL_CAPSULE | ORAL | Status: DC
Start: 2013-07-11 — End: 2014-03-24

## 2013-07-11 MED ORDER — HEXACHLOROPHENE 3 % EX LIQD: 1.0000 "application " | Freq: Once | CUTANEOUS | Status: DC

## 2013-07-11 NOTE — Progress Notes (Signed)
   Subjective:    Patient ID: Elizabeth Lozano, female    DOB: 1971/01/28, 43 y.o.   MRN: [id number]  HPI Elizabeth Lozano is a 43 year old female who comes in today for evaluation of 2 problems  She's had a history of knee pain left it off and on for about 10 years. It's been bothering her for about a month. She feels a grinding sensation and it bothers her when she climbs stairs. No history of trauma no history of swelling or locking  She's also had folliculitis in her left axillary a. She's had boils drained surgically in the past. She's tried soaping washing twice daily but has not resolving completely   Review of Systems Negative    Objective:   Physical Exam  Well-developed well-nourished female no acute distress vital signs stable she is afebrile examination of the left are shows some small pustules scar from previous surgical excision of a boil  In the sitting position both knees appear to be normal. There is no swelling there is no redness. Ligaments cartilage normal tenderness in the retinaculum      Assessment & Plan:  Left knee pain secondary to tenderness in the retinaculum.........Marland Kitchen elevation ice Motrin  Folliculitis left axillary area............ PHisoHex.......Marland Kitchen Keflex

## 2013-07-11 NOTE — Patient Instructions (Signed)
PHisoHex............. scrub left axillary area twice daily with small amounts  Keflex 500 mg.......... 2 twice daily  Motrin 600 mg.......... twice daily with food......Marland Kitchen elevation and ice each bedtime.

## 2013-07-22 ENCOUNTER — Telehealth: Payer: Self-pay | Admitting: Family Medicine

## 2013-07-22 DIAGNOSIS — L729 Follicular cyst of the skin and subcutaneous tissue, unspecified: Secondary | ICD-10-CM

## 2013-07-22 NOTE — Telephone Encounter (Signed)
Patient Information:  Caller Name: Chirstine  Phone: [phone]  Patient: Elizabeth Lozano  Gender: Female  DOB: [birth year]  Age: 43 Years  PCP: Kelle Darting [facility])  Pregnant: No  Office Follow Up:  Does the office need to follow up with this patient?: Yes  Instructions For The Office: Pt is on her second round of abx. She questions what her next step is. Please call.  RN Note:  Pt is being treated for folliculitis in her L axilla. The original area has cleared but now she has another swollen painful area in her L axilla. She did refill her Keflex on 07/21/13.  Symptoms  Reason For Call & Symptoms: Foliculitis  Reviewed Health History In EMR: Yes  Reviewed Medications In EMR: Yes  Reviewed Allergies In EMR: Yes  Reviewed Surgeries / Procedures: Yes  Date of Onset of Symptoms: 07/22/2013  Treatments Tried: Cephalexin  Treatments Tried Worked: No OB / GYN:  LMP: 07/08/2013  Guideline(s) Used:  Skin Lesion - Moles or Growths  Disposition Per Guideline:   See Today in Office  Reason For Disposition Reached:   Looks like a boil, infected sore, or deep ulcer  Advice Given:  Call Back If:  You become worse.  RN Overrode Recommendation:  Document Patient  Noted

## 2013-07-23 NOTE — Telephone Encounter (Signed)
Dr Terri Piedra per Dr Tawanna Cooler

## 2013-07-23 NOTE — Telephone Encounter (Signed)
Patient is aware and a referral placed. 

## 2013-10-25 ENCOUNTER — Telehealth: Payer: Self-pay | Admitting: Family Medicine

## 2013-10-25 NOTE — Telephone Encounter (Signed)
Patient Information:  Caller Name: Artrice  Phone: [phone]  Patient: Elizabeth Lozano  Gender: Female  DOB: 1970-08-15  Age: 43 Years  PCP: Kelle Darting [facility])  Pregnant: No  Office Follow Up:  Does the office need to follow up with this patient?: No  Instructions For The Office: N/A  RN Note:  Tooth pain rated mild to moderate.  Concerned about > swelling of gum overnight with upcoming weekend and VSD history. Instructed would need care if  fever, severe pain, purulent drainage or facial edemaa.  Agreed to try to contact dentist per protocol disposition.  Symptoms  Reason For Call & Symptoms: Swollen gum at 2nd to last tooth right lower jaw.  History of root canal on that tooth in past.  Saw dentist for toothache; diagnosed with cracked tooth and took "premed", of 4 Amoxicillin 500 mg about 2 week ago when dentist was planning to do a cap.  Dentist decided due to infection and root canal history that capping tooth would not be appropriate.  Scheduled pt for evaluation & possible extraction  by endodontist; has appt for 10/28/13. Hx VSD.  Had additional intermittent Amoxicillin doses in past two weeks including 2nd round of 2 Amox 500 mg over 2 days after saw dentist 10/17/13.  Endodontist ordered another Amox round of 2 500 mg BID 10/24/13 and 10/25/13.  Today, gum is more swollen and tooth is painful when touched.  Both dentist and edondontist are closed on Friday. Called to ask what signs/symptoms would indicate she needs to be seen sooner?  Reviewed Health History In EMR: Yes  Reviewed Medications In EMR: Yes  Reviewed Allergies In EMR: Yes  Reviewed Surgeries / Procedures: Yes  Date of Onset of Symptoms: 10/24/2013  Treatments Tried: Amox 500 mg BID 10/24/13 and 10/25/13, mouth wash, flossing area, keeping mouth clean  Treatments Tried Worked: No OB / GYN:  LMP: 09/27/2013  Guideline(s) Used:  Toothache  Tooth Injury  Disposition Per Guideline:   Call Dentist Now  Reason For  Disposition Reached:   Tooth sensitive to cold fluids  Advice Given:  Apply a Cold Pack:   For pain, apply a piece of ice or a Popsicle to the injured gum area for 20 minutes.  Soft Diet:   If you have any loose teeth, eat a soft diet for 3 days. After 3 days, they should be tightening up.  Call Your Dentist If:  Pain becomes severe  Tooth becomes sensitive to hot or cold fluids  Tooth becomes a darker color  You become worse.  Patient Will Follow Care Advice:  YES

## 2013-10-28 NOTE — Telephone Encounter (Signed)
Noted  

## 2013-12-18 ENCOUNTER — Other Ambulatory Visit: Payer: Self-pay | Admitting: Obstetrics and Gynecology

## 2013-12-19 LAB — CYTOLOGY - PAP

## 2014-03-17 ENCOUNTER — Other Ambulatory Visit: Payer: BC Managed Care – PPO

## 2014-03-20 ENCOUNTER — Other Ambulatory Visit (INDEPENDENT_AMBULATORY_CARE_PROVIDER_SITE_OTHER): Payer: BLUE CROSS/BLUE SHIELD

## 2014-03-20 DIAGNOSIS — Z0000 Encounter for general adult medical examination without abnormal findings: Secondary | ICD-10-CM

## 2014-03-20 LAB — POCT URINALYSIS DIP (MANUAL ENTRY)
BILIRUBIN UA: NEGATIVE
Glucose, UA: NEGATIVE
Ketones, POC UA: NEGATIVE
LEUKOCYTES UA: NEGATIVE
Nitrite, UA: NEGATIVE
PROTEIN UA: NEGATIVE
Spec Grav, UA: 1.015
UROBILINOGEN UA: 0.2

## 2014-03-20 LAB — CBC WITH DIFFERENTIAL/PLATELET
BASOS ABS: 0 10*3/uL (ref 0.0–0.1)
BASOS PCT: 0.9 % (ref 0.0–3.0)
EOS PCT: 2.6 % (ref 0.0–5.0)
Eosinophils Absolute: 0.1 10*3/uL (ref 0.0–0.7)
HEMATOCRIT: 33.2 % — AB (ref 36.0–46.0)
Hemoglobin: 10.8 g/dL — ABNORMAL LOW (ref 12.0–15.0)
LYMPHS ABS: 1.9 10*3/uL (ref 0.7–4.0)
LYMPHS PCT: 39.8 % (ref 12.0–46.0)
MCHC: 32.4 g/dL (ref 30.0–36.0)
MCV: 78.6 fl (ref 78.0–100.0)
MONOS PCT: 7.2 % (ref 3.0–12.0)
Monocytes Absolute: 0.3 10*3/uL (ref 0.1–1.0)
NEUTROS ABS: 2.4 10*3/uL (ref 1.4–7.7)
Neutrophils Relative %: 49.5 % (ref 43.0–77.0)
Platelets: 260 10*3/uL (ref 150.0–400.0)
RBC: 4.23 Mil/uL (ref 3.87–5.11)
RDW: 16.8 % — AB (ref 11.5–15.5)
WBC: 4.8 10*3/uL (ref 4.0–10.5)

## 2014-03-20 LAB — LIPID PANEL
CHOL/HDL RATIO: 3
Cholesterol: 180 mg/dL (ref 0–200)
HDL: 53.6 mg/dL (ref >39.00)
LDL CALC: 113 mg/dL — AB (ref 0–99)
NONHDL: 126.4
TRIGLYCERIDES: 69 mg/dL (ref 0.0–149.0)
VLDL: 13.8 mg/dL (ref 0.0–40.0)

## 2014-03-20 LAB — HEPATIC FUNCTION PANEL
ALBUMIN: 3.9 g/dL (ref 3.5–5.2)
ALT: 11 U/L (ref 0–35)
AST: 15 U/L (ref 0–37)
Alkaline Phosphatase: 62 U/L (ref 39–117)
BILIRUBIN DIRECT: 0 mg/dL (ref 0.0–0.3)
BILIRUBIN TOTAL: 0.4 mg/dL (ref 0.2–1.2)
TOTAL PROTEIN: 7 g/dL (ref 6.0–8.3)

## 2014-03-20 LAB — BASIC METABOLIC PANEL
BUN: 10 mg/dL (ref 6–23)
CO2: 26 mEq/L (ref 19–32)
CREATININE: 0.92 mg/dL (ref 0.40–1.20)
Calcium: 9 mg/dL (ref 8.4–10.5)
Chloride: 106 mEq/L (ref 96–112)
GFR: 70.16 mL/min (ref >60.00)
Glucose, Bld: 93 mg/dL (ref 70–99)
POTASSIUM: 4.5 meq/L (ref 3.5–5.1)
SODIUM: 138 meq/L (ref 135–145)

## 2014-03-20 LAB — TSH: TSH: 2.18 u[IU]/mL (ref 0.35–4.50)

## 2014-03-24 ENCOUNTER — Ambulatory Visit (INDEPENDENT_AMBULATORY_CARE_PROVIDER_SITE_OTHER): Payer: BLUE CROSS/BLUE SHIELD | Admitting: Family Medicine

## 2014-03-24 VITALS — BP 124/84 | Temp 97.8°F | Ht 65.25 in | Wt 166.0 lb

## 2014-03-24 DIAGNOSIS — D6489 Other specified anemias: Secondary | ICD-10-CM | POA: Insufficient documentation

## 2014-03-24 DIAGNOSIS — Z0000 Encounter for general adult medical examination without abnormal findings: Secondary | ICD-10-CM

## 2014-03-24 DIAGNOSIS — Q210 Ventricular septal defect: Secondary | ICD-10-CM

## 2014-03-24 NOTE — Patient Instructions (Signed)
Labs today to help determine the cause of your low blood count  I will call you the report

## 2014-03-24 NOTE — Progress Notes (Signed)
   Subjective:    Patient ID: Elizabeth Lozano, female    DOB: 11/08/1970, 44 y.o.   MRN: [id number]  HPI K is a 44 year old female who comes in today for general physical examination  She has a history of a VSD she is followed in cardiology yearly  She has a lesion on her left forehead she would like checked  She went off birth control in November 2015. She denied the [facility] for a long time. Despite that she's always had a low hemoglobin which she tells me know whenever has investigated.  She sees her GYN on a regular basis. Current birth control she says she's not sexually active.  She gets routine eye care, dental care, BSE monthly, annual mammography, colonoscopy not until 49. No family history of colon cancer polyps.  She declines a flu shot  Vaccinations otherwise up-to-date   Review of Systems  Constitutional: Negative.   HENT: Negative.   Eyes: Negative.   Respiratory: Negative.   Cardiovascular: Negative.   Gastrointestinal: Negative.   Endocrine: Negative.   Genitourinary: Negative.   Musculoskeletal: Negative.   Skin: Negative.   Allergic/Immunologic: Negative.   Neurological: Negative.   Hematological: Negative.   Psychiatric/Behavioral: Negative.        Objective:   Physical Exam  Constitutional: She appears well-developed and well-nourished.  HENT:  Head: Normocephalic and atraumatic.  Right Ear: External ear normal.  Left Ear: External ear normal.  Nose: Nose normal.  Mouth/Throat: Oropharynx is clear and moist.  Eyes: EOM are normal. Pupils are equal, round, and reactive to light.  Neck: Normal range of motion. Neck supple. No JVD present. No tracheal deviation present. No thyromegaly present.  Cardiovascular: Normal rate, regular rhythm, normal heart sounds and intact distal pulses.  Exam reveals no gallop and no friction rub.   No murmur heard. Pulmonary/Chest: Effort normal and breath sounds normal. No stridor. No respiratory distress. She has no wheezes.  She has no rales. She exhibits no tenderness.  Abdominal: Soft. Bowel sounds are normal. She exhibits no distension and no mass. There is no tenderness. There is no rebound and no guarding.  Genitourinary: Vagina normal and uterus normal. Guaiac negative stool. No vaginal discharge found.  Musculoskeletal: Normal range of motion.  Lymphadenopathy:    She has no cervical adenopathy.  Neurological: She is alert. She has normal reflexes. No cranial nerve deficit. She exhibits normal muscle tone. Coordination normal.  Skin: Skin is warm and dry. No rash noted. No erythema. No pallor.  Total body skin exam normal she has a garden variety of freckles Mollo's capillaries hemangiomas. The lesion left forehead is a seborrheic keratosis  Psychiatric: She has a normal mood and affect. Her behavior is normal. Judgment and thought content normal.  Nursing note and vitals reviewed.         Assessment & Plan:  Healthy female  History of VSD continue follow-up in cardiology  Anemia unknown etiology check labs

## 2014-03-25 LAB — FERRITIN: Ferritin: 3.7 ng/mL — ABNORMAL LOW (ref 10.0–291.0)

## 2014-03-25 LAB — VITAMIN B12: VITAMIN B 12: 227 pg/mL (ref 211–911)

## 2014-03-25 LAB — IRON: IRON: 23 ug/dL — AB (ref 42–145)

## 2014-06-16 ENCOUNTER — Ambulatory Visit (INDEPENDENT_AMBULATORY_CARE_PROVIDER_SITE_OTHER)
Admission: RE | Admit: 2014-06-16 | Discharge: 2014-06-16 | Disposition: A | Discharge status: Home / Self Care | Payer: BLUE CROSS/BLUE SHIELD | Source: Ambulatory Visit | Attending: Family Medicine | Admitting: Family Medicine

## 2014-06-16 ENCOUNTER — Encounter: Payer: Self-pay | Admitting: Family Medicine

## 2014-06-16 ENCOUNTER — Ambulatory Visit (INDEPENDENT_AMBULATORY_CARE_PROVIDER_SITE_OTHER): Payer: BLUE CROSS/BLUE SHIELD | Admitting: Family Medicine

## 2014-06-16 VITALS — BP 130/90 | Temp 98.5°F | Wt 166.0 lb

## 2014-06-16 DIAGNOSIS — M549 Dorsalgia, unspecified: Secondary | ICD-10-CM | POA: Diagnosis not present

## 2014-06-16 DIAGNOSIS — M545 Low back pain, unspecified: Secondary | ICD-10-CM | POA: Insufficient documentation

## 2014-06-16 NOTE — Progress Notes (Signed)
Pre visit review using our clinic review tool, if applicable. No additional management support is needed unless otherwise documented below in the visit note. 

## 2014-06-16 NOTE — Patient Instructions (Signed)
Go to the main office now for your x-rays  We will set you up a consult to begin physical therapy  Motrin 400 mg twice daily with food  Standing desk  Walk 30 minutes daily

## 2014-06-16 NOTE — Progress Notes (Signed)
   Subjective:    Patient ID: Elizabeth Lozano, female    DOB: 04/22/1970, 44 y.o.   MRN: [id number]  HPI Elizabeth Lozano is a 44 year old nonsmoking female who comes in today with a five-year history of left-sided back pain  She says this started about 5 years ago no history of trauma. She describes it is sometimes sharp sometimes dull. She points to lateral portion of left side of her back as a source for pain. Does not hurt in the midline. The pain comes and goes sometimes last for hours or days. She seems to think it hurts in her left hip as a source of her pain. The pain varies from a scale of 1-5. It's decreased by walking and increased by sitting. No history of trauma  Family history negative for back pain  She tells me she's been seeing a chiropractor in the past. She was told she had some scoliosis. She would like a note for a standing desk  LMP 2 weeks ago normal not sexually active   Review of Systems    no bowel or bladder dysfunction Objective:   Physical Exam Well-developed well-nourished female no acute distress vital signs stable she's afebrile examination spine shows no evidence of scoliosis and no spinal tenderness. In the supine position both legs were of equal length sensation muscle strength reflexes all within normal limits       Assessment & Plan:  Mechanical low back pain,,,,,,,, sounds muscular not neurologic,,,,,,, x-ray spine rule out underlying pathology,,,,,,,, begin physical therapy,,,,,,,,, patient requests a standing desk

## 2014-07-02 ENCOUNTER — Ambulatory Visit: Payer: BLUE CROSS/BLUE SHIELD | Attending: Family Medicine

## 2014-07-02 DIAGNOSIS — M545 Low back pain, unspecified: Secondary | ICD-10-CM

## 2014-07-02 DIAGNOSIS — M25652 Stiffness of left hip, not elsewhere classified: Secondary | ICD-10-CM | POA: Insufficient documentation

## 2014-07-02 NOTE — Therapy (Signed)
[facility] [address] [place], [place], [address] Phone: [phone]   Fax:  [phone]  Physical Therapy Evaluation  Patient Details  Name: Elizabeth BelfastKaye Mazzuca MRN: [id number] Date of Birth: 03/13/1970 Referring Provider:  Roderick Peeodd, Jeffrey A, MD  Encounter Date: 07/02/2014      PT End of Session - 07/02/14 1027    Visit Number 1   Date for PT Re-Evaluation 08/27/14   PT Start Time 0930   PT Stop Time 1016   PT Time Calculation (min) 46 min   Activity Tolerance Patient tolerated treatment well   Behavior During Therapy [facility] for tasks assessed/performed      Past Medical History  Diagnosis Date  . Asthma   . VSD (ventricular septal defect)     Cardiac CTA 05/2008: Membranous VSD, 8-9 mm in diameter, mild RVE, EF 64%, calcium score 0, no CAD. Last echo 02/2011: EF 55-60%, mild CAD, perimembranous VSD slightly more prominent but no RVE or pulmonary hypertension;  Echo (9/14): Small perimembranous VSD-no significant change since 02/2011; EF 55-60%, moderate LAE  . History of stress test     ETT-echo (9/14):  ST depression noted on ECGs; no wall motion abnormalities at peak exercise on echocardiogram    Past Surgical History  Procedure Laterality Date  . Cyst left wrist      There were no vitals filed for this visit.  Visit Diagnosis:  Left-sided low back pain without sciatica  Hip stiffness, left      Subjective Assessment - 07/02/14 0937    Subjective Pt reports left side tightness and pain throughout low back and hip that states that it radiates to upper thoracic spine and shoulder. Reports that this began about 5 years ago. She notices the pain after sitting for 8 hours at work all day and has requested a standing desk to alleviate pain (typically takes ibuprofen).    How long can you sit comfortably? Able to sit for 8 hours at work but pain begins at 5 minutes    How long can you stand comfortably? Not limited by LBP     How long can you walk comfortably? Able to walk 45 min without limitatoin    Diagnostic tests see report   Currently in Pain? Yes   Pain Score 4    Pain Location Back   Pain Orientation Left   Pain Descriptors / Indicators Stabbing   Pain Type Chronic pain   Pain Radiating Towards upper thoracic area    Pain Onset More than a month ago   Pain Frequency Intermittent   Aggravating Factors  sitting for long periods of time at work,    Pain Relieving Factors ibuprofen, standing    Effect of Pain on Daily Activities Need to sit at computer for work             [facility] PT Assessment - 07/02/14 0001    Assessment   Medical Diagnosis Left-sided back pain, unspecified location; M54.9 (ICD-10-CM)   Precautions   Precautions None   Restrictions   Weight Bearing Restrictions No   Balance Screen   Has the patient fallen in the past 6 months No   Has the patient had a decrease in activity level because of a fear of falling?  No   Is the patient reluctant to leave their home because of a fear of falling?  No   Home Environment   Living Enviornment Private residence   Prior Function   Level  of Independence Independent with basic ADLs   Cognition   Overall Cognitive Status Within Functional Limits for tasks assessed   Observation/Other Assessments   Focus on Therapeutic Outcomes (FOTO)  29%   Posture/Postural Control   Posture/Postural Control Postural limitations   Postural Limitations --  Thoracic spine scoliosis with L rib hump;    ROM / Strength   AROM / PROM / Strength AROM;PROM;Strength   AROM   Overall AROM  Within functional limits for tasks performed   Overall AROM Comments Lumbar spine motions WFL  Tightness associated with pain noted with R sidebend    AROM Assessment Site Lumbar   PROM   Overall PROM  Deficits   PROM Assessment Site Hip   Right/Left Hip Left   Left Hip External Rotation  --  Limited compared to R, noted tightness   Strength   Overall Strength  Within functional limits for tasks performed   Palpation   Palpation Noted tenderness at L QL and L piriformis  Normal lumbar PA mobility                           PT Education - 07/02/14 1026    Education provided Yes   Education Details Flexbility stretches for QL and piriformis muscle   Person(s) Educated Patient   Methods Explanation;Demonstration;Tactile cues;Handout   Comprehension Verbalized understanding;Returned demonstration          PT Short Term Goals - 07/02/14 1041    PT SHORT TERM GOAL #1   Title Pt will report a < or =  25% decrease in pain while sitting at desk during work day    Time 4   Period Weeks   Status New   PT SHORT TERM GOAL #2   Title Pt will be independent with HEP    Time 8   Period Weeks   Status New           PT Long Term Goals - 07/02/14 1042    PT LONG TERM GOAL #1   Title Pt will know how to advance postural exercise HEP    Time 8   Period Weeks   Status New   PT LONG TERM GOAL #2   Title Pt will report a < or = 75% in low back and hip pain after sitting 8 hours at desk    Time 8   Period Weeks   Status New               Plan - 07/02/14 1034    Clinical Impression Statement 44 y.o woman presents with increased lower left back and hip pain that increases with sitting for any period of time that began 5 years ago. Pt has a history of congential back deformities and L thoracic curve scoliosis.  Increased tightness at L piriformis and L QL  are contributing to back pain. 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    Pt will benefit from skilled therapeutic intervention in order to improve on the following deficits Impaired flexibility;Postural dysfunction;Increased muscle spasms;Pain   Rehab Potential Good   PT Frequency 2x / week   PT Duration 8 weeks   PT Treatment/Interventions Moist Heat;Therapeutic activities;Patient/family  education;Therapeutic exercise;Traction;Manual techniques   PT Next Visit Plan Try QL muscle energy techniques, review stretching program, ask how standing desk is going at work, postural exercises with theraband and arm bike  Consulted and Agree with Plan of Care Patient         Problem List Patient Active Problem List   Diagnosis Date Noted  . Left-sided back pain 06/16/2014  . Anemia due to other cause 03/24/2014  . Routine general medical examination at a health care facility 03/24/2014  . Left knee pain 07/11/2013  . Folliculitis 07/11/2013  . VSD (ventricular septal defect) 03/09/2011  . SEBORRHEIC KERATOSIS, INFLAMED 03/12/2010  . ABSCESS, BREAST, LEFT 02/12/2009  . URTICARIA 06/03/2008  . SEBACEOUS CYST 10/09/2007  . ASTHMA 10/31/2006     Reyes Ivan, SPT 07/02/2014 10:53 AM  [facility] [address] [place], [place], [address] Phone: [phone]   Fax:  [phone]

## 2014-07-02 NOTE — Patient Instructions (Signed)
Side Stretch   With feet shoulder width apart, hands on hips, inhale. Then exhale while bending directly to side, keeping top arm outstretched. Hold 10 seconds. Slowly return to starting position. Repeat to other side. Repeat ____ times, each side.  Copyright  VHI. All rights reserved.  Stretching: Piriformis (Supine)   Pull right knee toward opposite shoulder. Hold _20___ seconds. Relax. Repeat _3___ times per set. Do _1___ sets per session. Do 1____ sessions per day.  http://orth.exer.us/713   Copyright  VHI. All rights reserved.  Piriformis Stretch, Sitting   Sit, one ankle on opposite knee, same-side hand on crossed knee. Push down on knee, keeping spine straight. Lean torso forward, with flat back, until tension is felt in hamstrings and gluteals of crossed-leg side. Hold 20___ seconds.  Repeat _3__ times per session. Do _3__ sessions per day.  Copyright  VHI. All rights reserved.

## 2014-07-08 ENCOUNTER — Ambulatory Visit: Payer: BLUE CROSS/BLUE SHIELD | Admitting: Physical Therapy

## 2014-07-08 DIAGNOSIS — M545 Low back pain, unspecified: Secondary | ICD-10-CM

## 2014-07-08 DIAGNOSIS — M25652 Stiffness of left hip, not elsewhere classified: Secondary | ICD-10-CM

## 2014-07-08 NOTE — Therapy (Signed)
[facility] [address] [place], [place], [address] Phone: [phone]   Fax:  [phone]  Physical Therapy Treatment  Patient Details  Name: Elizabeth Lozano MRN: [id number] Date of Birth: 05-22-1970 Referring Provider:  Dorena Cookey, MD  Encounter Date: 07/08/2014      PT End of Session - 07/08/14 0853    Visit Number 2   Date for PT Re-Evaluation 08/27/14   PT Start Time 0852   PT Stop Time 0930   PT Time Calculation (min) 38 min   Activity Tolerance Patient tolerated treatment well      Past Medical History  Diagnosis Date  . Asthma   . VSD (ventricular septal defect)     Cardiac CTA 05/2008: Membranous VSD, 8-9 mm in diameter, mild RVE, EF 64%, calcium score 0, no CAD. Last echo 02/2011: EF 55-60%, mild CAD, perimembranous VSD slightly more prominent but no RVE or pulmonary hypertension;  Echo (9/14): Small perimembranous VSD-no significant change since 02/2011; EF 55-60%, moderate LAE  . History of stress test     ETT-echo (9/14):  ST depression noted on ECGs; no wall motion abnormalities at peak exercise on echocardiogram    Past Surgical History  Procedure Laterality Date  . Cyst left wrist      There were no vitals filed for this visit.  Visit Diagnosis:  Left-sided low back pain without sciatica  Hip stiffness, left      Subjective Assessment - 07/08/14 0854    Subjective No pain just tightness.   Currently in Pain? No/denies                         [facility] Adult PT Treatment/Exercise - 07/08/14 0001    Exercises   Exercises Lumbar;Knee/Hip   Lumbar Exercises: Stretches   Piriformis Stretch --  supine knee across body 2 x 20 seconds   Lumbar Exercises: Aerobic   UBE (Upper Arm Bike) L1 2/2 cues for abd bracing   Lumbar Exercises: Standing   Other Standing Lumbar Exercises QL stretch 2 x 20 seconds   Lumbar Exercises: Supine   Ab Set 20 reps  with horiz abd,diagonals, OH  flex/abd  on roller green band   Bent Knee Raise 20 reps;1 second  on foam roller; no arm support   Lumbar Exercises: Prone   Other Prone Lumbar Exercises pressups x 10    Lumbar Exercises: Quadruped   Other Quadruped Lumbar Exercises childs pose 2 x 30 sec, left and right x 30 sec; then with arm underneath to opposiste side x 30 ea.   Knee/Hip Exercises: Stretches   Piriformis Stretch 2 reps;20 seconds   Manual Therapy   Manual Therapy Joint mobilization   Joint Mobilization Lumbar unilateral PA bil grade III/IV                  PT Short Term Goals - 07/02/14 1041    PT SHORT TERM GOAL #1   Title Pt will report a < or =  25% decrease in pain while sitting at desk during work day    Time 4   Period Weeks   Status New   PT SHORT TERM GOAL #2   Title Pt will be independent with HEP    Time 8   Period Weeks   Status New           PT Long Term Goals - 07/02/14 1141    PT LONG TERM  GOAL #3   Title reduce FOTO to < 26% limitation   Time 8   Period Weeks   Status New   PT LONG TERM GOAL #4   Title sit for 15 minutes in the car without increased pain   Time 8   Period Weeks   Status New               Plan - 07/08/14 8110    Clinical Impression Statement Patient tolerated treatment well. No increase in pain with therex. No goals met as only second visit. Stiff lumbar mobility left > right.   PT Next Visit Plan Continue with core and postural strengthening.         Problem List Patient Active Problem List   Diagnosis Date Noted  . Left-sided back pain 06/16/2014  . Anemia due to other cause 03/24/2014  . Routine general medical examination at a health care facility 03/24/2014  . Left knee pain 07/11/2013  . Folliculitis 31/59/4585  . VSD (ventricular septal defect) 03/09/2011  . SEBORRHEIC KERATOSIS, INFLAMED 03/12/2010  . ABSCESS, BREAST, LEFT 02/12/2009  . URTICARIA 06/03/2008  . SEBACEOUS CYST 10/09/2007  . ASTHMA 10/31/2006   Madelyn Flavors PT  07/08/2014, 9:32 AM  [facility] [address], [place], [place], [address] Phone: [phone]   Fax:  [phone]

## 2014-07-10 ENCOUNTER — Encounter: Payer: BLUE CROSS/BLUE SHIELD | Admitting: Physical Therapy

## 2014-07-16 ENCOUNTER — Ambulatory Visit: Payer: BLUE CROSS/BLUE SHIELD | Attending: Family Medicine | Admitting: Physical Therapy

## 2014-07-16 DIAGNOSIS — M25652 Stiffness of left hip, not elsewhere classified: Secondary | ICD-10-CM | POA: Diagnosis present

## 2014-07-16 DIAGNOSIS — M545 Low back pain, unspecified: Secondary | ICD-10-CM

## 2014-07-16 NOTE — Therapy (Signed)
[facility] [address] [place], [place], [address] Phone: [phone]   Fax:  [phone]  Physical Therapy Treatment  Patient Details  Name: Elizabeth Lozano MRN: [id number] Date of Birth: 01/27/1971 Referring Provider:  Dorena Cookey, MD  Encounter Date: 07/16/2014      PT End of Session - 07/16/14 0854    Visit Number 3   Date for PT Re-Evaluation 08/27/14   PT Start Time 0852   PT Stop Time 0930   PT Time Calculation (min) 38 min   Activity Tolerance Patient tolerated treatment well      Past Medical History  Diagnosis Date  . Asthma   . VSD (ventricular septal defect)     Cardiac CTA 05/2008: Membranous VSD, 8-9 mm in diameter, mild RVE, EF 64%, calcium score 0, no CAD. Last echo 02/2011: EF 55-60%, mild CAD, perimembranous VSD slightly more prominent but no RVE or pulmonary hypertension;  Echo (9/14): Small perimembranous VSD-no significant change since 02/2011; EF 55-60%, moderate LAE  . History of stress test     ETT-echo (9/14):  ST depression noted on ECGs; no wall motion abnormalities at peak exercise on echocardiogram    Past Surgical History  Procedure Laterality Date  . Cyst left wrist      There were no vitals filed for this visit.  Visit Diagnosis:  Left-sided low back pain without sciatica  Hip stiffness, left      Subjective Assessment - 07/16/14 0854    Subjective I was bad on Sunday and layed around. By end of day it was hurting agtain. Back to stretching it now feels better.    Currently in Pain? No/denies                         [facility] Adult PT Treatment/Exercise - 07/16/14 0001    Lumbar Exercises: Aerobic   UBE (Upper Arm Bike) standing on Bosu L3 3 min fwd/3 min bwd   Lumbar Exercises: Supine   Ab Set 5 reps;5 seconds   Bent Knee Raise 20 reps  then with up/up/down/down; bicycle x20 ea   Lumbar Exercises: Prone   Straight Leg Raise 10 reps;2 seconds  bil with  pelvic press   Opposite Arm/Leg Raise 10 reps;2 seconds  bil   Other Prone Lumbar Exercises pelvic press 5 x 5 sec;    Lumbar Exercises: Quadruped   Opposite Arm/Leg Raise 10 reps;5 seconds   Plank 45 sec; side plank bil x 30 sec   Other Quadruped Lumbar Exercises childs pose 2 x 30 sec, left and right x 30 sec; then with arm underneath to opposiste side x 30 ea.                PT Education - 07/16/14 1507    Education provided Yes   Education Details plank, side plank, quad opp arm/leg   Person(s) Educated Patient   Methods Explanation;Demonstration;Handout   Comprehension Verbalized understanding;Returned demonstration;Tactile cues required;Verbal cues required          PT Short Term Goals - 07/02/14 1041    PT SHORT TERM GOAL #1   Title Pt will report a < or =  25% decrease in pain while sitting at desk during work day    Time 4   Period Weeks   Status New   PT SHORT TERM GOAL #2   Title Pt will be independent with HEP    Time 8  Period Weeks   Status New           PT Long Term Goals - 07/02/14 1141    PT LONG TERM GOAL #3   Title reduce FOTO to < 26% limitation   Time 8   Period Weeks   Status New   PT LONG TERM GOAL #4   Title sit for 15 minutes in the car without increased pain   Time 8   Period Weeks   Status New               Plan - 07/16/14 1508    Clinical Impression Statement Patient demos core weakness with quadriped  activities. Good abdominal strength. No goals met at this time        Problem List Patient Active Problem List   Diagnosis Date Noted  . Left-sided back pain 06/16/2014  . Anemia due to other cause 03/24/2014  . Routine general medical examination at a health care facility 03/24/2014  . Left knee pain 07/11/2013  . Folliculitis 32/35/5732  . VSD (ventricular septal defect) 03/09/2011  . SEBORRHEIC KERATOSIS, INFLAMED 03/12/2010  . ABSCESS, BREAST, LEFT 02/12/2009  . URTICARIA 06/03/2008  . SEBACEOUS CYST  10/09/2007  . ASTHMA 10/31/2006    Madelyn Flavors PT  07/16/2014, 3:12 PM  [facility] [address], [place], [place], [address] Phone: [phone]   Fax:  [phone]

## 2014-07-16 NOTE — Patient Instructions (Addendum)
Plank   Support body on hands and feet. Keep hips in line with torso and arms straight under chest. Avoid locking elbows. Hold for 30 seconds. ADVANCED: Extend one leg up.  Copyright  VHI. All rights reserved.   Side Plank  Modification: Do side plank on elbow. From side, press up on one arm and side of foot. Extend other arm up. Hold for seconds. Repeat on other side.   Copyright  VHI. All rights reserved.     Upper / Lower Extremity Extension (All-Fours)   Tighten stomach and raise right leg and opposite arm. Keep trunk rigid and level. Hold 5 seconds Repeat _10___ times per set. Do __1-2__ sets per session. Do __1__ sessions per day.  http://orth.exer.us/1118   Copyright  VHI. All rights reserved.  Solon PalmJulie Gailya Tauer, PT 07/16/2014 9:23 AM [facility] [address], [address] [place], [place] [address] Phone # [phone] Fax [phone]

## 2014-07-18 ENCOUNTER — Encounter: Payer: BLUE CROSS/BLUE SHIELD | Admitting: Physical Therapy

## 2014-07-21 ENCOUNTER — Ambulatory Visit: Payer: BLUE CROSS/BLUE SHIELD | Admitting: Physical Therapy

## 2014-07-21 ENCOUNTER — Encounter: Payer: Self-pay | Admitting: Physical Therapy

## 2014-07-21 DIAGNOSIS — M545 Low back pain, unspecified: Secondary | ICD-10-CM

## 2014-07-21 DIAGNOSIS — M25652 Stiffness of left hip, not elsewhere classified: Secondary | ICD-10-CM

## 2014-07-21 NOTE — Therapy (Signed)
[facility] [address] [place], [place], [address] Phone: [phone]   Fax:  [phone]  Physical Therapy Treatment  Patient Details  Name: Elizabeth Lozano MRN: [id number] Date of Birth: [birth year] Referring Provider:  Dorena Cookey, MD  Encounter Date: 07/21/2014      PT End of Session - 07/21/14 0920    Visit Number 4   Date for PT Re-Evaluation 08/27/14   PT Start Time 0842   PT Stop Time 0921   PT Time Calculation (min) 39 min   Activity Tolerance Patient tolerated treatment well   Behavior During Therapy [facility] for tasks assessed/performed      Past Medical History  Diagnosis Date  . Asthma   . VSD (ventricular septal defect)     Cardiac CTA 05/2008: Membranous VSD, 8-9 mm in diameter, mild RVE, EF 64%, calcium score 0, no CAD. Last echo 02/2011: EF 55-60%, mild CAD, perimembranous VSD slightly more prominent but no RVE or pulmonary hypertension;  Echo (9/14): Small perimembranous VSD-no significant change since 02/2011; EF 55-60%, moderate LAE  . History of stress test     ETT-echo (9/14):  ST depression noted on ECGs; no wall motion abnormalities at peak exercise on echocardiogram    Past Surgical History  Procedure Laterality Date  . Cyst left wrist      There were no vitals filed for this visit.  Visit Diagnosis:  Left-sided low back pain without sciatica  Hip stiffness, left      Subjective Assessment - 07/21/14 0848    Subjective Had good weekend.    Currently in Pain? Yes   Pain Location Back   Pain Orientation Left;Lateral   Pain Descriptors / Indicators Tightness   Aggravating Factors  Sitting   Pain Relieving Factors Standing   Multiple Pain Sites No                         [facility] Adult PT Treatment/Exercise - 07/21/14 0001    Lumbar Exercises: Stretches   Single Knee to Chest Stretch Limitations --  PUll to RT shoulder 3x 20 sec   Standing Side Bend 3 reps;20 seconds    Standing Side Bend Limitations To right   Lumbar Exercises: Aerobic   Elliptical R 2, L4 x 5 min   Lumbar Exercises: Quadruped   Opposite Arm/Leg Raise Right arm/Left leg;Left arm/Right leg;5 reps;5 seconds   Plank 45 sec; side plank bil x 30 sec   Other Quadruped Lumbar Exercises childs pose 2 x 30 sec, left and right x 30 sec; then with arm underneath to opposiste side x 30 ea.                PT Education - 07/21/14 0908    Education provided Yes   Education Details HEP quadruped ex   Person(s) Educated Patient   Methods Explanation;Demonstration;Verbal cues;Tactile cues;Handout   Comprehension Verbalized understanding;Returned demonstration          PT Short Term Goals - 07/21/14 0859    PT SHORT TERM GOAL #1   Title Pt will report a < or =  25% decrease in pain while sitting at desk during work day    Time 4   Period Weeks   Status Achieved  100%  better doing "the right things."   PT SHORT TERM GOAL #2   Title Pt will be independent with HEP    Time 8   Period Weeks  Status Achieved           PT Long Term Goals - 07/02/14 1141    PT LONG TERM GOAL #3   Title reduce FOTO to < 26% limitation   Time 8   Period Weeks   Status New   PT LONG TERM GOAL #4   Title sit for 15 minutes in the car without increased pain   Time 8   Period Weeks   Status New               Plan - 07/21/14 0920    Clinical Impression Statement Added quadruped exercises to HEP today. Met all STGs today. Stressed importance of engaging her core prior to moving her limbs. She could demo this well during her exercises.   Pt will benefit from skilled therapeutic intervention in order to improve on the following deficits Impaired flexibility;Postural dysfunction;Increased muscle spasms;Pain;Decreased activity tolerance   Rehab Potential Good   PT Frequency 2x / week   PT Duration 8 weeks   PT Treatment/Interventions Moist Heat;Therapeutic activities;Patient/family  education;Therapeutic exercise;Traction;Manual techniques;Ultrasound;Cryotherapy;Electrical Stimulation;Neuromuscular re-education;Passive range of motion   PT Next Visit Plan Continue with core and postural strengthening.    Consulted and Agree with Plan of Care Patient        Problem List Patient Active Problem List   Diagnosis Date Noted  . Left-sided back pain 06/16/2014  . Anemia due to other cause 03/24/2014  . Routine general medical examination at a health care facility 03/24/2014  . Left knee pain 07/11/2013  . Folliculitis 54/62/7035  . VSD (ventricular septal defect) 03/09/2011  . SEBORRHEIC KERATOSIS, INFLAMED 03/12/2010  . ABSCESS, BREAST, LEFT 02/12/2009  . URTICARIA 06/03/2008  . SEBACEOUS CYST 10/09/2007  . ASTHMA 10/31/2006    Donyale Falcon, PTA 07/21/2014, 9:22 AM  [facility] [address], [place], [place], [address] Phone: [phone]   Fax:  [phone]

## 2014-07-21 NOTE — Patient Instructions (Signed)
  Isometric Hold (Quadruped)   On hands and knees, slowly inhale, and then exhale. Pull navel toward spine and Hold for ___ seconds. Continue to breathe in and out during hold. Rest for ___ seconds. Repeat ___ times. Do ___ times a day.      Copyright  VHI. All rights reserved.  Bracing With Leg Raise (Quadruped)   On hands and knees find neutral spine. Tighten pelvic floor and abdominals and hold. Alternating legs, straighten and lift to hip level. Repeat __5-8_ times. Do __1_ times a day.   Copyright  VHI. All rights reserved.  Bracing With Arm / Leg Raise (Quadruped)   On hands and knees find neutral spine. Tighten pelvic floor and abdominals and hold. Alternating, lift arm to shoulder level and opposite leg to hip level. Repeat _5-8__ times. Do __1_ times a day.   Copyright  VHI. All rights reserved.

## 2014-07-23 ENCOUNTER — Ambulatory Visit: Payer: BLUE CROSS/BLUE SHIELD

## 2014-07-23 DIAGNOSIS — M545 Low back pain, unspecified: Secondary | ICD-10-CM

## 2014-07-23 DIAGNOSIS — M25652 Stiffness of left hip, not elsewhere classified: Secondary | ICD-10-CM

## 2014-07-23 NOTE — Therapy (Addendum)
[facility] [address] [place], [place], [address] Phone: [phone]   Fax:  [phone]  Physical Therapy Treatment  Patient Details  Name: Elizabeth Lozano MRN: [id number] Date of Birth: [birth year] Referring Provider:  Dorena Cookey, MD  Encounter Date: 07/23/2014      PT End of Session - 07/23/14 0915    Visit Number 5   Date for PT Re-Evaluation 08/27/14   PT Start Time 0845   PT Stop Time 0922   PT Time Calculation (min) 37 min   Activity Tolerance Patient tolerated treatment well   Behavior During Therapy [facility] for tasks assessed/performed      Past Medical History  Diagnosis Date  . Asthma   . VSD (ventricular septal defect)     Cardiac CTA 05/2008: Membranous VSD, 8-9 mm in diameter, mild RVE, EF 64%, calcium score 0, no CAD. Last echo 02/2011: EF 55-60%, mild CAD, perimembranous VSD slightly more prominent but no RVE or pulmonary hypertension;  Echo (9/14): Small perimembranous VSD-no significant change since 02/2011; EF 55-60%, moderate LAE  . History of stress test     ETT-echo (9/14):  ST depression noted on ECGs; no wall motion abnormalities at peak exercise on echocardiogram    Past Surgical History  Procedure Laterality Date  . Cyst left wrist      There were no vitals filed for this visit.  Visit Diagnosis:  Left-sided low back pain without sciatica  Hip stiffness, left      Subjective Assessment - 07/23/14 0848    Subjective Doing well with exercises   Currently in Pain? Yes   Pain Location Back   Pain Orientation Left;Lateral   Pain Descriptors / Indicators Tightness   Pain Type Chronic pain   Pain Onset More than a month ago   Pain Frequency Intermittent   Aggravating Factors  sitting, inactivity   Pain Relieving Factors standing                         [facility] Adult PT Treatment/Exercise - 07/23/14 0001    Lumbar Exercises: Stretches   Piriformis Stretch 2 reps;20  seconds  diagonal knee to chest   Lumbar Exercises: Aerobic   Elliptical R 2, L4 x 5 min   UBE (Upper Arm Bike) seated on green ball: Level 1x 6 minutes  Lt thoracic discomfort with reverse   Lumbar Exercises: Supine   Other Supine Lumbar Exercises supine on foam roll for decompression x 4 minutes   Other Supine Lumbar Exercises Yellow theraband: horizontal abduction & D2 on foam roll 2x10   Lumbar Exercises: Quadruped   Opposite Arm/Leg Raise Right arm/Left leg;Left arm/Right leg;5 seconds;10 reps  diffuculty keeping level with this, VCs to engage core   Other Quadruped Lumbar Exercises childs pose 2 x 30 sec, lateral prayer 2x30 seconds each                  PT Short Term Goals - 07/21/14 0859    PT SHORT TERM GOAL #1   Title Pt will report a < or =  25% decrease in pain while sitting at desk during work day    Time 4   Period Weeks   Status Achieved  100%  better doing "the right things."   PT SHORT TERM GOAL #2   Title Pt will be independent with HEP    Time 8   Period Weeks   Status Achieved  PT Long Term Goals - 07/02/14 1141    PT LONG TERM GOAL #3   Title reduce FOTO to < 26% limitation   Time 8   Period Weeks   Status New   PT LONG TERM GOAL #4   Title sit for 15 minutes in the car without increased pain   Time 8   Period Weeks   Status New               Plan - 07/23/14 8295    Clinical Impression Statement Pt with improved postural awareness and ability to sit with improved posture with work tasks.  Pt with continued functional weakness in the thoracic spine and core.  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     Pt will benefit from skilled therapeutic intervention in order to improve on the following deficits Impaired flexibility;Postural dysfunction;Increased muscle spasms;Pain;Decreased activity tolerance   Rehab Potential Good   PT Frequency 2x / week   PT Duration 8 weeks   PT  Treatment/Interventions Moist Heat;Therapeutic activities;Patient/family education;Therapeutic exercise;Traction;Manual techniques;Ultrasound;Cryotherapy;Electrical Stimulation;Neuromuscular re-education;Passive range of motion   PT Next Visit Plan Continue with core and postural strengthening.    Consulted and Agree with Plan of Care Patient        Problem List Patient Active Problem List   Diagnosis Date Noted  . Left-sided back pain 06/16/2014  . Anemia due to other cause 03/24/2014  . Routine general medical examination at a health care facility 03/24/2014  . Left knee pain 07/11/2013  . Folliculitis 62/13/0865  . VSD (ventricular septal defect) 03/09/2011  . SEBORRHEIC KERATOSIS, INFLAMED 03/12/2010  . ABSCESS, BREAST, LEFT 02/12/2009  . URTICARIA 06/03/2008  . SEBACEOUS CYST 10/09/2007  . ASTHMA 10/31/2006    Tonnie Stillman, PT 07/23/2014, 9:20 AM PHYSICAL THERAPY DISCHARGE SUMMARY  Visits from Start of Care: 5  Current functional level related to goals / functional outcomes: Pt attended 5 PT sessions and cancelled all remaining appointments.  No reason given.     Remaining deficits: See above for most current status.  Pt didn't return to PT.   Education / Equipment: HEP Plan: Patient agrees to discharge.  Patient goals were partially met. Patient is being discharged due to not returning since the last visit.  ?????   Sigurd Sos, PT 08/25/2014 4:03 PM  [facility] [address] [place], [place], [address] Phone: [phone]   Fax:  [phone]

## 2014-08-01 ENCOUNTER — Encounter: Payer: BLUE CROSS/BLUE SHIELD | Admitting: Physical Therapy

## 2014-10-09 ENCOUNTER — Other Ambulatory Visit: Payer: Self-pay | Admitting: Obstetrics and Gynecology

## 2014-10-10 LAB — CYTOLOGY - PAP

## 2014-10-21 ENCOUNTER — Ambulatory Visit: Payer: BLUE CROSS/BLUE SHIELD | Admitting: Family Medicine

## 2014-10-27 ENCOUNTER — Telehealth: Payer: Self-pay | Admitting: Family Medicine

## 2014-10-27 ENCOUNTER — Encounter: Payer: Self-pay | Admitting: Family Medicine

## 2014-10-27 ENCOUNTER — Ambulatory Visit (INDEPENDENT_AMBULATORY_CARE_PROVIDER_SITE_OTHER): Payer: BLUE CROSS/BLUE SHIELD | Admitting: Family Medicine

## 2014-10-27 VITALS — BP 124/84 | Temp 98.1°F | Wt 164.0 lb

## 2014-10-27 DIAGNOSIS — M1611 Unilateral primary osteoarthritis, right hip: Secondary | ICD-10-CM | POA: Insufficient documentation

## 2014-10-27 DIAGNOSIS — Z23 Encounter for immunization: Secondary | ICD-10-CM | POA: Diagnosis not present

## 2014-10-27 MED ORDER — PREDNISONE 20 MG PO TABS: ORAL_TABLET | ORAL | Status: DC

## 2014-10-27 MED ORDER — TRAMADOL HCL 50 MG PO TABS: 50.0000 mg | ORAL_TABLET | Freq: Three times a day (TID) | ORAL | Status: DC | PRN

## 2014-10-27 NOTE — Telephone Encounter (Signed)
Dr Tawanna Cooler will discuss with patient at the appointment today.

## 2014-10-27 NOTE — Telephone Encounter (Signed)
Pt states her gluteal muscles are aching really bad at the point she cannot sit down or do much of anything.   pt states she has been going to rehab but does not know what is going on now. Pt has appt w/ dr Tawanna Cooler 10/4. Not sure if she if a muscle relaxer would help. Pt states she can barely think about getting into the car. However, made pt appt for 2:30 this afternoon. Pt wants to know should she continue aleve until then, or could Dr todd call something in?   Advised pt to keep appt until she hears from [place]. pls advise.  [facility] [place]

## 2014-10-27 NOTE — Patient Instructions (Signed)
Prednisone 20 mg..... 2 tabs 3 days..... One tab 3 days..... Half a tab 3 days...Marland KitchenMarland KitchenMarland Kitchen then a half tab Monday Wednesday Friday for a two-week taper  Tramadol 50 mg........Marland Kitchen 1/2-1 tablet twice daily for pain  Physical therapy consult ASAP  1 she is finished the prednisone than I would switch to Motrin 400 mg twice daily with food

## 2014-10-27 NOTE — Progress Notes (Signed)
   Subjective:    Patient ID: Elizabeth Lozano, female    DOB: [birth year], 44 y.o.   MRN: [id number]  HPI K is a 44 year old female comes in today for evaluation of osteoarthritis  She's had a history of the last year of joint pain. We did some screening x-rays which were nondiagnostic. We send her to physical therapy with some good relief. A couple weeks ago she had a flare and his been taking her Motrin 4 mg twice a day however it doesn't seem to be helping. She does a lot of exercise on Saturday and Sunday morning she woke up with severe pain.  Her pain is mostly on her left hip. She describes as dull constant an 8 on a scale of 1-10. She denies any neurologic symptoms or trauma   Review of Systems Neurologic review orthopedic review of systems otherwise negative  No fever chills weight loss etc. LMP 2 weeks ago normal    Objective:   Physical Exam Well-developed well-nourished female no acute distress vital signs stable she's afebrile examination spine was normal upper extremities are normal lower extremities normal. There is some tenderness in the right and left SI joints. 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 Plan short course of prednisone and tramadol then switch back to Motrin restart physical therapy

## 2014-10-27 NOTE — Progress Notes (Signed)
Pre visit review using our clinic review tool, if applicable. No additional management support is needed unless otherwise documented below in the visit note. 

## 2014-10-27 NOTE — Telephone Encounter (Signed)
Pt aware to wait and see dr Elizabeth Lozano

## 2014-10-30 ENCOUNTER — Ambulatory Visit: Payer: BLUE CROSS/BLUE SHIELD | Attending: Family Medicine

## 2014-10-30 DIAGNOSIS — M545 Low back pain: Secondary | ICD-10-CM | POA: Diagnosis present

## 2014-10-30 DIAGNOSIS — M25652 Stiffness of left hip, not elsewhere classified: Secondary | ICD-10-CM | POA: Diagnosis not present

## 2014-10-30 DIAGNOSIS — M25552 Pain in left hip: Secondary | ICD-10-CM

## 2014-10-30 DIAGNOSIS — M25651 Stiffness of right hip, not elsewhere classified: Secondary | ICD-10-CM | POA: Insufficient documentation

## 2014-10-30 NOTE — Therapy (Signed)
[facility] [address] [place], [place], [address] Phone: [phone]   Fax:  [phone]  Physical Therapy Evaluation  Patient Details  Name: Elizabeth Lozano MRN: [id number] Date of Birth: October 10, 1970 Referring Provider:  Roderick Pee, MD  Encounter Date: 10/30/2014      PT End of Session - 10/30/14 0923    Visit Number 1   Date for PT Re-Evaluation 12/25/14   PT Start Time 0849   PT Stop Time 0923   PT Time Calculation (min) 34 min   Activity Tolerance Patient tolerated treatment well   Behavior During Therapy [facility] for tasks assessed/performed      Past Medical History  Diagnosis Date  . Asthma   . VSD (ventricular septal defect)     Cardiac CTA 05/2008: Membranous VSD, 8-9 mm in diameter, mild RVE, EF 64%, calcium score 0, no CAD. Last echo 02/2011: EF 55-60%, mild CAD, perimembranous VSD slightly more prominent but no RVE or pulmonary hypertension;  Echo (9/14): Small perimembranous VSD-no significant change since 02/2011; EF 55-60%, moderate LAE  . History of stress test     ETT-echo (9/14):  ST depression noted on ECGs; no wall motion abnormalities at peak exercise on echocardiogram    Past Surgical History  Procedure Laterality Date  . Cyst left wrist      There were no vitals filed for this visit.  Visit Diagnosis:  Hip stiffness, left - Plan: PT plan of care cert/re-cert  Hip pain, left - Plan: PT plan of care cert/re-cert      Subjective Assessment - 10/30/14 0855    Subjective Pt presents to PT with reports of Lt hip pain that began a few months ago.  Pt was treated at this clinic for LBP and has continued to do her HEP.  Pt has a standing desk now.  Pt report that she pulled her Lt hip muscle (abductor) and went to see MD.     Limitations Sitting   How long can you sit comfortably? 10 minutes max   How long can you walk comfortably? limited when not wearing good shoes   Patient Stated Goals reduce  Lt hip pain, descend steps with increased ease   Currently in Pain? Yes   Pain Score 5   1-2/10 with meds today   Pain Location Hip   Pain Orientation Left   Pain Descriptors / Indicators Tightness   Pain Type Acute pain   Pain Onset More than a month ago   Pain Frequency Intermittent   Aggravating Factors  sitting too long, standing/walking   Pain Relieving Factors ibuprofen, ice, stretching            [facility] PT Assessment - 10/30/14 0001    Assessment   Medical Diagnosis OA of Rt hip, Lt hip pain   Onset Date/Surgical Date 10/25/14   Precautions   Precautions None   Restrictions   Weight Bearing Restrictions No   Balance Screen   Has the patient fallen in the past 6 months No   Has the patient had a decrease in activity level because of a fear of falling?  No   Is the patient reluctant to leave their home because of a fear of falling?  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  Cognition   Overall Cognitive Status Within Functional Limits for tasks assessed   Observation/Other Assessments   Focus on Therapeutic Outcomes (FOTO)  53% limitation   Posture/Postural Control   Posture/Postural Control Postural limitations   Postural Limitations --  Thoracic spine scoliosis with Lt convex curve    ROM / Strength   AROM / PROM / Strength AROM   AROM   Overall AROM  Within functional limits for tasks performed   Overall AROM Comments Lumbar AROM is full with Rt lateral lumbar pain reported with Lt sidebeding   PROM   Overall PROM  Deficits   Overall PROM Comments Lt hip ER limited by 25% vs the Rt   Strength   Overall Strength Within functional limits for tasks performed   Palpation   Palpation comment Pt with significant palpable tenderness over Lt greater trochanteric bursa and ITB    Ambulation/Gait   Ambulation/Gait Yes   Ambulation/Gait Assistance 7: Independent                   [facility] Adult PT Treatment/Exercise - 10/30/14 0001    Modalities   Modalities Iontophoresis   Iontophoresis   Type of Iontophoresis Dexamethasone   Location Lt greater trochanter  #1   Dose 1.0 cc   Time 6 wear time                PT Education - 10/30/14 0923    Education provided Yes   Education Details HEP: ITB, hamstring and hip flexor stretch   Person(s) Educated Patient   Methods Explanation;Demonstration;Handout   Comprehension Verbalized understanding;Returned demonstration          PT Short Term Goals - 10/30/14 0926    PT SHORT TERM GOAL #1   Title be independent with initial HEP   Time 4   Period Weeks   Status New   PT SHORT TERM GOAL #2   Title report a 30% reduction in Lt hip pain with sitting and standing   Time 4   Period Weeks   Status New           PT Long Term Goals - 10/30/14 0857    PT LONG TERM GOAL #1   Title be independent in advanced HEP   Time 8   Period Weeks   Status New   PT LONG TERM GOAL #2   Title reduce FOTO to < or = to 38% limitation   Time 8   Period Weeks   Status New   PT LONG TERM GOAL #3   Title report a 60% reduction in Lt hip pain with sitting and standing   Time 8   Period Weeks   Status New   PT LONG TERM GOAL #4   Title descend steps with 25% increased ease   Time 8   Period Weeks   Status New               Plan - 10/30/14 6962    Clinical Impression Statement Pt presents to PT with new onset of Lt lateral hip pain that flared up on 10/25/14.  Pt demonstrates Lt hip stiffness and significant tenderness to palpation over Lt lateral ITB and greater trochanter.  FOTO score is 53% limitation. Pt will benefit from skilled PT for ionto, manual. flexibility and strength to reduce pain.     Pt will benefit from skilled therapeutic intervention in order to improve on the following deficits  Impaired flexibility;Postural dysfunction;Increased muscle spasms;Pain;Decreased activity tolerance  Rehab Potential Good   PT Frequency 2x / week   PT Duration 8 weeks   PT Treatment/Interventions Moist Heat;Therapeutic activities;Patient/family education;Therapeutic exercise;Traction;Manual techniques;Ultrasound;Cryotherapy;Electrical Stimulation;Neuromuscular re-education;Passive range of motion;Iontophoresis 4mg /ml Dexamethasone;Taping   PT Next Visit Plan Continue ionto, manual to Lt ITB, flexibiity and strength   Consulted and Agree with Plan of Care Patient         Problem List Patient Active Problem List   Diagnosis Date Noted  . Osteoarthritis of right hip 10/27/2014  . Left-sided back pain 06/16/2014  . Anemia due to other cause 03/24/2014  . Routine general medical examination at a health care facility 03/24/2014  . Left knee pain 07/11/2013  . Folliculitis 07/11/2013  . VSD (ventricular septal defect) 03/09/2011  . SEBORRHEIC KERATOSIS, INFLAMED 03/12/2010  . ABSCESS, BREAST, LEFT 02/12/2009  . URTICARIA 06/03/2008  . SEBACEOUS CYST 10/09/2007  . ASTHMA 10/31/2006    TAKACS,KELLY, PT 10/30/2014, 9:30 AM  [facility] [address] [place], [place], [address] Phone: [phone]   Fax:  [phone]

## 2014-10-30 NOTE — Patient Instructions (Signed)
IONTOPHORESIS PATIENT PRECAUTIONS & CONTRAINDICATIONS:  . Redness under one or both electrodes can occur.  This characterized by a uniform redness that usually disappears within 12 hours of treatment. . Small pinhead size blisters may result in response to the drug.  Contact your physician if the problem persists more than 24 hours. . On rare occasions, iontophoresis therapy can result in temporary skin reactions such as rash, inflammation, irritation or burns.  The skin reactions may be the result of individual sensitivity to the ionic solution used, the condition of the skin at the start of treatment, reaction to the materials in the electrodes, allergies or sensitivity to dexamethasone, or a poor connection between the patch and your skin.  Discontinue using iontophoresis if you have any of these reactions and report to your therapist. . Remove the Patch or electrodes if you have any undue sensation of pain or burning during the treatment and report discomfort to your therapist. . Tell your Therapist if you have had known adverse reactions to the application of electrical current. . If using the Patch, the LED light will turn off when treatment is complete and the patch can be removed.  Approximate treatment time is 1-3 hours.  Remove the patch when light goes off or after 6 hours. . The Patch can be worn during normal activity, however excessive motion where the electrodes have been placed can cause poor contact between the skin and the electrode or uneven electrical current resulting in greater risk of skin irritation. Marland Kitchen Keep out of the reach of children.   . DO NOT use if you have a cardiac pacemaker or any other electrically sensitive implanted device. . DO NOT use if you have a known sensitivity to dexamethasone. . DO NOT use during Magnetic Resonance Imaging (MRI). . DO NOT use over broken or compromised skin (e.g. sunburn, cuts, or acne) due to the increased risk of skin reaction. . DO  NOT SHAVE over the area to be treated:  To establish good contact between the Patch and the skin, excessive hair may be clipped. . DO NOT place the Patch or electrodes on or over your eyes, directly over your heart, or brain. . DO NOT reuse the Patch or electrodes as this may cause burns to occur.  Hip Flexor Stretch   Lying on back near edge of bed, bend one leg, foot flat. Hang other leg over edge, relaxed, thigh resting entirely on bed for 20 seconds. Repeat __3__ times. Do _3___ sessions per day. Advanced Exercise: Bend knee back keeping thigh in contact with bed.  http://gt2.exer.us/347   Copyright  VHI. All rights reserved.  HIP: Hamstrings - Short Sitting   Rest leg on raised surface. Keep knee straight. Lift chest. Hold _20__ seconds. _3__ reps per set, _3__ sets per day, ___ days per week  Copyright  VHI. All rights reserved.  IT Band: Leg Hang (Side-Lying)   Lie on side with right leg on top. Keep hip and knee straight. Move top leg behind and hang over edge. Hold _20__ seconds. Relax. Repeat _3__ times. Do _3__ times a day. Repeat on other side.    Copyright  VHI. All rights reserved.  [facility] [address], [address] [place], [place] [address] Phone # [phone] Fax [phone]

## 2014-11-03 ENCOUNTER — Ambulatory Visit: Payer: BLUE CROSS/BLUE SHIELD | Admitting: Rehabilitation

## 2014-11-03 ENCOUNTER — Encounter: Payer: Self-pay | Admitting: Rehabilitation

## 2014-11-03 DIAGNOSIS — M25552 Pain in left hip: Secondary | ICD-10-CM

## 2014-11-03 DIAGNOSIS — M25652 Stiffness of left hip, not elsewhere classified: Secondary | ICD-10-CM | POA: Diagnosis not present

## 2014-11-03 NOTE — Therapy (Signed)
[facility] [address], [address] [place], [place], [address] Phone: [phone]   Fax:  [phone]  Physical Therapy Treatment  Patient Details  Name: Elizabeth Lozano MRN: [id number] Date of Birth: 11-17-1970 Referring Provider:  Roderick Pee, MD  Encounter Date: 11/03/2014      PT End of Session - 11/03/14 0840    Visit Number 2   Date for PT Re-Evaluation 12/25/14   PT Start Time 0802   PT Stop Time 0840   PT Time Calculation (min) 38 min   Activity Tolerance Patient tolerated treatment well      Past Medical History  Diagnosis Date  . Asthma   . VSD (ventricular septal defect)     Cardiac CTA 05/2008: Membranous VSD, 8-9 mm in diameter, mild RVE, EF 64%, calcium score 0, no CAD. Last echo 02/2011: EF 55-60%, mild CAD, perimembranous VSD slightly more prominent but no RVE or pulmonary hypertension;  Echo (9/14): Small perimembranous VSD-no significant change since 02/2011; EF 55-60%, moderate LAE  . History of stress test     ETT-echo (9/14):  ST depression noted on ECGs; no wall motion abnormalities at peak exercise on echocardiogram    Past Surgical History  Procedure Laterality Date  . Cyst left wrist      There were no vitals filed for this visit.  Visit Diagnosis:  Hip stiffness, left  Hip pain, left      Subjective Assessment - 11/03/14 0804    Subjective the left ITB is hurting proximally.  Has been hurting constantly but also increased this morning since walking the dog.  Has been doing the exercises at home.  Got some new shoes which have orthotics.  Ionto patch was okay but starting leaking   Pain Score 1    Pain Location Hip   Pain Orientation Left   Aggravating Factors  sitting too long, standing/walking                         [facility] Adult PT Treatment/Exercise - 11/03/14 0001    Exercises   Exercises Knee/Hip   Knee/Hip Exercises: Stretches   Active Hamstring Stretch Left;3  reps;20 seconds  lateral hip only   ITB Stretch Left;3 reps;20 seconds  off edge of table; education for edge of bed at home   Knee/Hip Exercises: Sidelying   Hip ABduction Strengthening;Both;2 sets;15 reps   Hip ABduction Limitations hooklying green band   Other Sidelying Knee/Hip Exercises traditional sidelying abd x 10 with manual cueing   Modalities   Modalities Iontophoresis;Ultrasound   Ultrasound   Ultrasound Location L greater trochanter   Ultrasound Parameters 1.2wcm2 50%x   Ultrasound Goals Pain   Iontophoresis   Type of Iontophoresis Dexamethasone   Location Lt greater trochanter   Dose 1.0 cc   Time 6 wear time   Manual Therapy   Manual Therapy Soft tissue mobilization   Soft tissue mobilization proximal ITB also using stick                PT Education - 11/03/14 0840    Education provided Yes   Education Details lateral ITB with strap   Person(s) Educated Patient          PT Short Term Goals - 10/30/14 0926    PT SHORT TERM GOAL #1   Title be independent with initial HEP   Time 4   Period Weeks   Status New   PT  SHORT TERM GOAL #2   Title report a 30% reduction in Lt hip pain with sitting and standing   Time 4   Period Weeks   Status New           PT Long Term Goals - 10/30/14 0857    PT LONG TERM GOAL #1   Title be independent in advanced HEP   Time 8   Period Weeks   Status New   PT LONG TERM GOAL #2   Title reduce FOTO to < or = to 38% limitation   Time 8   Period Weeks   Status New   PT LONG TERM GOAL #3   Title report a 60% reduction in Lt hip pain with sitting and standing   Time 8   Period Weeks   Status New   PT LONG TERM GOAL #4   Title descend steps with 25% increased ease   Time 8   Period Weeks   Status New               Plan - 11/03/14 0840    Clinical Impression Statement Tolerated all well.  +2 ttp mid ITB belly with palpable knotting.  no new pain with exercises or strengthening         Problem List Patient Active Problem List   Diagnosis Date Noted  . Osteoarthritis of right hip 10/27/2014  . Left-sided back pain 06/16/2014  . Anemia due to other cause 03/24/2014  . Routine general medical examination at a health care facility 03/24/2014  . Left knee pain 07/11/2013  . Folliculitis 07/11/2013  . VSD (ventricular septal defect) 03/09/2011  . SEBORRHEIC KERATOSIS, INFLAMED 03/12/2010  . ABSCESS, BREAST, LEFT 02/12/2009  . URTICARIA 06/03/2008  . SEBACEOUS CYST 10/09/2007  . ASTHMA 10/31/2006    Idamae Lusher, DPT, CMP 11/03/2014, 8:42 AM  [facility] [address], [address] [place], [place], [address] Phone: [phone]   Fax:  [phone]

## 2014-11-06 ENCOUNTER — Ambulatory Visit: Payer: BLUE CROSS/BLUE SHIELD | Admitting: Physical Therapy

## 2014-11-06 ENCOUNTER — Encounter: Payer: Self-pay | Admitting: Physical Therapy

## 2014-11-06 DIAGNOSIS — M25652 Stiffness of left hip, not elsewhere classified: Secondary | ICD-10-CM

## 2014-11-06 DIAGNOSIS — M545 Low back pain, unspecified: Secondary | ICD-10-CM

## 2014-11-06 DIAGNOSIS — M25552 Pain in left hip: Secondary | ICD-10-CM

## 2014-11-06 NOTE — Therapy (Signed)
[facility] [address], [address] [place], [place], [address] Phone: [phone]   Fax:  [phone]  Physical Therapy Treatment  Patient Details  Name: Elizabeth Lozano MRN: [id number] Date of Birth: [birth year] Referring Provider:  Roderick Pee, MD  Encounter Date: 11/06/2014      PT End of Session - 11/06/14 0856    Visit Number 3   Date for PT Re-Evaluation 12/25/14   PT Start Time 0800   PT Stop Time 0844   PT Time Calculation (min) 44 min   Activity Tolerance Patient tolerated treatment well   Behavior During Therapy [facility] for tasks assessed/performed      Past Medical History  Diagnosis Date  . Asthma   . VSD (ventricular septal defect)     Cardiac CTA 05/2008: Membranous VSD, 8-9 mm in diameter, mild RVE, EF 64%, calcium score 0, no CAD. Last echo 02/2011: EF 55-60%, mild CAD, perimembranous VSD slightly more prominent but no RVE or pulmonary hypertension;  Echo (9/14): Small perimembranous VSD-no significant change since 02/2011; EF 55-60%, moderate LAE  . History of stress test     ETT-echo (9/14):  ST depression noted on ECGs; no wall motion abnormalities at peak exercise on echocardiogram    Past Surgical History  Procedure Laterality Date  . Cyst left wrist      There were no vitals filed for this visit.  Visit Diagnosis:  Hip stiffness, left  Hip pain, left  Left-sided low back pain without sciatica      Subjective Assessment - 11/06/14 0849    Subjective Pt reports feeling 50% improvement, with therapy and new shoes; however, she has the feeling that she stretched to much yesterday.    Currently in Pain? Yes   Pain Score 1    Pain Location Hip   Pain Orientation Left   Pain Descriptors / Indicators Tightness   Pain Onset More than a month ago   Pain Frequency Intermittent                         [facility] Adult PT Treatment/Exercise - 11/06/14 0001    Exercises   Exercises Knee/Hip   Knee/Hip Exercises: Stretches   Active Hamstring Stretch Left;3 reps;20 seconds   ITB Stretch Left;3 reps;20 seconds  at edge of table, remindet on performing at edge of bed a th   Knee/Hip Exercises: Aerobic   Recumbent Bike L1 x 6 min    Modalities   Modalities Iontophoresis;Ultrasound   Ultrasound   Ultrasound Location Left greater trochanter   Ultrasound Parameters 50%, , 1Wcm,    Ultrasound Goals Pain   Iontophoresis   Type of Iontophoresis Dexamethasone   Location Lt greater trochanter   Dose 1.0 cc #3   Time 6 wear time   Manual Therapy   Manual Therapy Soft tissue mobilization   Soft tissue mobilization proximal ITB and gluteus medius and maximus also using stick                PT Education - 11/06/14 0856    Education provided Yes   Education Details Iontophoresis   Person(s) Educated Patient   Methods Explanation   Comprehension Verbalized understanding          PT Short Term Goals - 11/06/14 0858    PT SHORT TERM GOAL #1   Title be independent with initial HEP   Time 4   Period Weeks   Status On-going  PT SHORT TERM GOAL #2   Title report a 30% reduction in Lt hip pain with sitting and standing  50% as of 9/ 21/16   Time 4   Period Weeks   Status Achieved           PT Long Term Goals - 10/30/14 0857    PT LONG TERM GOAL #1   Title be independent in advanced HEP   Time 8   Period Weeks   Status New   PT LONG TERM GOAL #2   Title reduce FOTO to < or = to 38% limitation   Time 8   Period Weeks   Status New   PT LONG TERM GOAL #3   Title report a 60% reduction in Lt hip pain with sitting and standing   Time 8   Period Weeks   Status New   PT LONG TERM GOAL #4   Title descend steps with 25% increased ease   Time 8   Period Weeks   Status New               Plan - 11/06/14 4098    Clinical Impression Statement Pt tolerated well and she rates her improvement as 50%. pt with palpaple tenderness on left hip and gluteal  area along iliac crest.   Pt will benefit from skilled therapeutic intervention in order to improve on the following deficits Impaired flexibility;Postural dysfunction;Increased muscle spasms;Pain;Decreased activity tolerance   Rehab Potential Good   PT Frequency 2x / week   PT Duration 8 weeks   PT Treatment/Interventions Moist Heat;Therapeutic activities;Patient/family education;Therapeutic exercise;Traction;Manual techniques;Ultrasound;Cryotherapy;Electrical Stimulation;Neuromuscular re-education;Passive range of motion;Iontophoresis /ml Dexamethasone;Taping   PT Next Visit Plan Continue ionto, manual to Lt ITB, flexibiity and strength   Consulted and Agree with Plan of Care Patient        Problem List Patient Active Problem List   Diagnosis Date Noted  . Osteoarthritis of right hip 10/27/2014  . Left-sided back pain 06/16/2014  . Anemia due to other cause 03/24/2014  . Routine general medical examination at a health care facility 03/24/2014  . Left knee pain 07/11/2013  . Folliculitis 07/11/2013  . VSD (ventricular septal defect) 03/09/2011  . SEBORRHEIC KERATOSIS, INFLAMED 03/12/2010  . ABSCESS, BREAST, LEFT 02/12/2009  . URTICARIA 06/03/2008  . SEBACEOUS CYST 10/09/2007  . ASTHMA 10/31/2006    NAUMANN-HOUEGNIFIO,ELKE PTA 11/06/2014, 9:01 AM  [facility] [address] [place], [place], [address] Phone: [phone]   Fax:  [phone]

## 2014-11-10 ENCOUNTER — Ambulatory Visit: Payer: BLUE CROSS/BLUE SHIELD

## 2014-11-10 DIAGNOSIS — M25652 Stiffness of left hip, not elsewhere classified: Secondary | ICD-10-CM | POA: Diagnosis not present

## 2014-11-10 DIAGNOSIS — M25552 Pain in left hip: Secondary | ICD-10-CM

## 2014-11-10 DIAGNOSIS — M545 Low back pain, unspecified: Secondary | ICD-10-CM

## 2014-11-10 DIAGNOSIS — M25651 Stiffness of right hip, not elsewhere classified: Secondary | ICD-10-CM

## 2014-11-10 NOTE — Therapy (Signed)
[facility] [address] [place], [place], [address] Phone: [phone]   Fax:  [phone]  Physical Therapy Treatment  Patient Details  Name: Elizabeth Lozano MRN: [id number] Date of Birth: [birth year] Referring Provider:  Dorena Cookey, MD  Encounter Date: 11/10/2014      PT End of Session - 11/10/14 1646    Visit Number 4   Date for PT Re-Evaluation 12/25/14   PT Start Time 0175   PT Stop Time 1655   PT Time Calculation (min) 38 min   Activity Tolerance Patient tolerated treatment well   Behavior During Therapy [facility] for tasks assessed/performed      Past Medical History  Diagnosis Date  . Asthma   . VSD (ventricular septal defect)     Cardiac CTA 05/2008: Membranous VSD, 8-9 mm in diameter, mild RVE, EF 64%, calcium score 0, no CAD. Last echo 02/2011: EF 55-60%, mild CAD, perimembranous VSD slightly more prominent but no RVE or pulmonary hypertension;  Echo (9/14): Small perimembranous VSD-no significant change since 02/2011; EF 55-60%, moderate LAE  . History of stress test     ETT-echo (9/14):  ST depression noted on ECGs; no wall motion abnormalities at peak exercise on echocardiogram    Past Surgical History  Procedure Laterality Date  . Cyst left wrist      There were no vitals filed for this visit.  Visit Diagnosis:  Hip stiffness, left  Hip pain, left  Left-sided low back pain without sciatica  Hip stiffness, right      Subjective Assessment - 11/10/14 1614    Subjective Lt side is feeling good.  Pt now with Rt sciatica that started 2 days ago.  Going to see chiropractor tomorrow.     Currently in Pain? Yes   Pain Score 0-No pain   Pain Location Hip   Pain Orientation Left   Aggravating Factors  No left hip pain but Rt sciatica is flared up.  Pain radiates to the Rt LE 4-5/10.   Pain Relieving Factors ibuprofen, ice, stretching                         [facility] Adult PT  Treatment/Exercise - 11/10/14 0001    Modalities   Modalities Iontophoresis;Ultrasound;Electrical Stimulation;Cryotherapy   Cryotherapy   Number Minutes Cryotherapy 15 Minutes   Cryotherapy Location Other (comment)  bilateral SI joints and Rt gluteals   Electrical Stimulation   Electrical Stimulation Location Bil gluteals, SI joints   Electrical Stimulation Action IFC   Electrical Stimulation Parameters 15 minutes   Electrical Stimulation Goals Pain   Iontophoresis   Type of Iontophoresis Dexamethasone   Location Lt greater trochanter   Dose 1.0 cc #4   Time 6 wear time   Manual Therapy   Manual Therapy Muscle Energy Technique;Passive ROM   Passive ROM into Rt hip flexion and IR   Muscle Energy Technique contract relax to Rt ilium, PROM to hanmstrings.                  PT Short Term Goals - 11/10/14 1642    PT SHORT TERM GOAL #1   Title be independent with initial HEP   Status Achieved   PT SHORT TERM GOAL #2   Title report a 30% reduction in Lt hip pain with sitting and standing   Status Achieved           PT Long Term Goals - 10/30/14 1025  PT LONG TERM GOAL #1   Title be independent in advanced HEP   Time 8   Period Weeks   Status New   PT LONG TERM GOAL #2   Title reduce FOTO to < or = to 38% limitation   Time 8   Period Weeks   Status New   PT LONG TERM GOAL #3   Title report a 60% reduction in Lt hip pain with sitting and standing   Time 8   Period Weeks   Status New   PT LONG TERM GOAL #4   Title descend steps with 25% increased ease   Time 8   Period Weeks   Status New               Plan - 11/10/14 1642    Clinical Impression Statement Pt without Lt hip pain this week.  Pt with flare-up of Rt sciatica and will see chiropractor tomorrow.  Pt with painful hip mobility and increased tightness on the Rt.  Goals met as related to Lt hip symptoms but limited ability to assess fully as pt with Rt sciatic nerve pain that is limiting her  mobility.     Pt will benefit from skilled therapeutic intervention in order to improve on the following deficits Impaired flexibility;Postural dysfunction;Increased muscle spasms;Pain;Decreased activity tolerance   Rehab Potential Good   PT Frequency 2x / week   PT Duration 8 weeks   PT Treatment/Interventions Moist Heat;Therapeutic activities;Patient/family education;Therapeutic exercise;Traction;Manual techniques;Ultrasound;Cryotherapy;Electrical Stimulation;Neuromuscular re-education;Passive range of motion;Iontophoresis 47m/ml Dexamethasone;Taping   PT Next Visit Plan Continue ionto, manual to Lt ITB, flexibiity and strength.  Resume activity as pt able due to Rt LE symptoms   Consulted and Agree with Plan of Care Patient        Problem List Patient Active Problem List   Diagnosis Date Noted  . Osteoarthritis of right hip 10/27/2014  . Left-sided back pain 06/16/2014  . Anemia due to other cause 03/24/2014  . Routine general medical examination at a health care facility 03/24/2014  . Left knee pain 07/11/2013  . Folliculitis 090/30/1499 . VSD (ventricular septal defect) 03/09/2011  . SEBORRHEIC KERATOSIS, INFLAMED 03/12/2010  . ABSCESS, BREAST, LEFT 02/12/2009  . URTICARIA 06/03/2008  . SEBACEOUS CYST 10/09/2007  . ASTHMA 10/31/2006    TAKACS,KELLY, PT 11/10/2014, 4:48 PM  [facility] [address] [place] [address]: [phone]  Fax:  [phone]

## 2014-11-10 NOTE — Patient Instructions (Signed)

## 2014-11-12 ENCOUNTER — Encounter: Payer: BLUE CROSS/BLUE SHIELD | Admitting: Physical Therapy

## 2014-11-17 ENCOUNTER — Encounter: Payer: BLUE CROSS/BLUE SHIELD | Admitting: Physical Therapy

## 2014-11-18 ENCOUNTER — Ambulatory Visit: Payer: BLUE CROSS/BLUE SHIELD | Admitting: Family Medicine

## 2014-11-19 ENCOUNTER — Encounter: Payer: Self-pay | Admitting: Physical Therapy

## 2014-11-19 ENCOUNTER — Ambulatory Visit: Payer: BLUE CROSS/BLUE SHIELD | Attending: Family Medicine | Admitting: Physical Therapy

## 2014-11-19 DIAGNOSIS — M545 Low back pain, unspecified: Secondary | ICD-10-CM

## 2014-11-19 DIAGNOSIS — M25652 Stiffness of left hip, not elsewhere classified: Secondary | ICD-10-CM | POA: Insufficient documentation

## 2014-11-19 DIAGNOSIS — M25651 Stiffness of right hip, not elsewhere classified: Secondary | ICD-10-CM | POA: Insufficient documentation

## 2014-11-19 DIAGNOSIS — M25552 Pain in left hip: Secondary | ICD-10-CM | POA: Diagnosis present

## 2014-11-19 NOTE — Therapy (Signed)
[facility] [address] [place], [place], [address] Phone: [phone]   Fax:  [phone]  Physical Therapy Treatment  Patient Details  Name: Elizabeth Lozano MRN: [id number] Date of Birth: 18-Apr-1970 Referring Provider:  Roderick Pee, MD  Encounter Date: 11/19/2014      PT End of Session - 11/19/14 0926    Visit Number 5   Date for PT Re-Evaluation 12/25/14   PT Start Time 0843   PT Stop Time 0945   PT Time Calculation (min) 62 min   Activity Tolerance Patient tolerated treatment well   Behavior During Therapy [facility] for tasks assessed/performed      Past Medical History  Diagnosis Date  . Asthma   . VSD (ventricular septal defect)     Cardiac CTA 05/2008: Membranous VSD, 8-9 mm in diameter, mild RVE, EF 64%, calcium score 0, no CAD. Last echo 02/2011: EF 55-60%, mild CAD, perimembranous VSD slightly more prominent but no RVE or pulmonary hypertension;  Echo (9/14): Small perimembranous VSD-no significant change since 02/2011; EF 55-60%, moderate LAE  . History of stress test     ETT-echo (9/14):  ST depression noted on ECGs; no wall motion abnormalities at peak exercise on echocardiogram    Past Surgical History  Procedure Laterality Date  . Cyst left wrist      There were no vitals filed for this visit.  Visit Diagnosis:  Hip stiffness, left  Hip pain, left  Left-sided low back pain without sciatica  Hip stiffness, right      Subjective Assessment - 11/19/14 0847    Subjective Saw chiropractor and received an adjustment. This helped immediately but as she retured to walking symtpoms returned. Reports "my pelvis is off."   Currently in Pain? Yes   Pain Score 5    Pain Location Hip   Pain Orientation Left   Pain Descriptors / Indicators Discomfort;Aching   Aggravating Factors  Sitting and walking    Pain Relieving Factors supine, ice, adjustments   Multiple Pain Sites No                          [facility] Adult PT Treatment/Exercise - 11/19/14 0001    Lumbar Exercises: Supine   Bent Knee Raise 20 reps   Other Supine Lumbar Exercises Birdge 10x   Knee/Hip Exercises: Prone   Straight Leg Raises --  Psoas release with soft foam roll   Moist Heat Therapy   Number Minutes Moist Heat 15 Minutes   Moist Heat Location Lumbar Spine   Electrical Stimulation   Electrical Stimulation Location Lt lumbar to hip   Electrical Stimulation Action IFC   Electrical Stimulation Parameters 80-15 HZ   Electrical Stimulation Goals Pain   Manual Therapy   Manual Therapy --  Pt's pelvis was in alignment                PT Education - 11/19/14 0855    Education provided Yes   Education Details HEP pelvic force couples   Person(s) Educated Patient   Methods Explanation;Demonstration;Tactile cues;Verbal cues;Handout   Comprehension Verbalized understanding;Returned demonstration          PT Short Term Goals - 11/10/14 1642    PT SHORT TERM GOAL #1   Title be independent with initial HEP   Status Achieved   PT SHORT TERM GOAL #2   Title report a 30% reduction in Lt hip pain with sitting and standing  Status Achieved           PT Long Term Goals - 11/19/14 0925    PT LONG TERM GOAL #1   Title be independent in advanced HEP   Time 8   Period Weeks   Status On-going  Working towards   PT LONG TERM GOAL #3   Title report a 60% reduction in Lt hip pain with sitting and standing   Time 8   Period Weeks   Status On-going  In current flare up of LT hip               Plan - 11/19/14 0927    Clinical Impression Statement Worked towards exercises that will help her maintain a level pelvis more often. She was in alignment today. We discussed force couples at the hip/pelvis to increase her understanding of how the pelvis can get rotated. She demonstrated core weakness during the session. Added to HEP today.    Pt will benefit from  skilled therapeutic intervention in order to improve on the following deficits Impaired flexibility;Postural dysfunction;Increased muscle spasms;Pain;Decreased activity tolerance   Rehab Potential Good   PT Frequency 2x / week   PT Duration 8 weeks   PT Treatment/Interventions Moist Heat;Therapeutic activities;Patient/family education;Therapeutic exercise;Traction;Manual techniques;Ultrasound;Cryotherapy;Electrical Stimulation;Neuromuscular re-education;Passive range of motion;Iontophoresis /ml Dexamethasone;Taping   PT Next Visit Plan Continue core strength build, hip flexibility and strength.    Consulted and Agree with Plan of Care Patient        Problem List Patient Active Problem List   Diagnosis Date Noted  . Osteoarthritis of right hip 10/27/2014  . Left-sided back pain 06/16/2014  . Anemia due to other cause 03/24/2014  . Routine general medical examination at a health care facility 03/24/2014  . Left knee pain 07/11/2013  . Folliculitis 07/11/2013  . VSD (ventricular septal defect) 03/09/2011  . SEBORRHEIC KERATOSIS, INFLAMED 03/12/2010  . ABSCESS, BREAST, LEFT 02/12/2009  . URTICARIA 06/03/2008  . SEBACEOUS CYST 10/09/2007  . ASTHMA 10/31/2006    Porcha Deblanc, PTA 11/19/2014, 11:47 AM  [facility] [address], [address] [place], [place], [address] Phone: [phone]   Fax:  [phone]

## 2014-11-19 NOTE — Patient Instructions (Addendum)
Hip Home Exercises  - Lay on back: pull RT knee to chest, LT leg straight. Relax 15 sec to 1 min, do 2x. Then let LT leg hang off the bed. 3x 15 sec to 1 min hold.  Do 1-2 x day   - Supine bridge; Knees bent, lift bottom slowly, hold 5 sec, do 10 x 2 sets. Make sure to engage the core to initiate the movement AND when you are holding the position.    _ Hip external rotation release: Lay flat with RT leg straight and LT knee bent. SLOWLY drop the LT thigh outward as long as you can keep your RT pelvis down. Make sure to engage the core first before moving the Lt hip. Do 10-20 moving the thigh bone in & out slowly.

## 2014-11-27 ENCOUNTER — Ambulatory Visit: Payer: BLUE CROSS/BLUE SHIELD

## 2014-11-27 DIAGNOSIS — M545 Low back pain, unspecified: Secondary | ICD-10-CM

## 2014-11-27 DIAGNOSIS — M25652 Stiffness of left hip, not elsewhere classified: Secondary | ICD-10-CM | POA: Diagnosis not present

## 2014-11-27 DIAGNOSIS — M25552 Pain in left hip: Secondary | ICD-10-CM

## 2014-11-27 NOTE — Therapy (Addendum)
[facility] [address], [place], [place], [address] Phone: [phone]   Fax:  [phone]  Physical Therapy Treatment  Patient Details  Name: Elizabeth Lozano MRN: [id number] Date of Birth: 08-06-1970 Referring Provider:  Dorena Cookey, MD  Encounter Date: 11/27/2014      PT End of Session - 11/27/14 0913    Visit Number 6   Date for PT Re-Evaluation 12/25/14   PT Start Time 1287   PT Stop Time 0914   PT Time Calculation (min) 27 min   Activity Tolerance Patient tolerated treatment well   Behavior During Therapy [facility] for tasks assessed/performed      Past Medical History  Diagnosis Date  . Asthma   . VSD (ventricular septal defect)     Cardiac CTA 05/2008: Membranous VSD, 8-9 mm in diameter, mild RVE, EF 64%, calcium score 0, no CAD. Last echo 02/2011: EF 55-60%, mild CAD, perimembranous VSD slightly more prominent but no RVE or pulmonary hypertension;  Echo (9/14): Small perimembranous VSD-no significant change since 02/2011; EF 55-60%, moderate LAE  . History of stress test     ETT-echo (9/14):  ST depression noted on ECGs; no wall motion abnormalities at peak exercise on echocardiogram    Past Surgical History  Procedure Laterality Date  . Cyst left wrist      There were no vitals filed for this visit.  Visit Diagnosis:  Hip stiffness, left  Hip pain, left  Left-sided low back pain without sciatica      Subjective Assessment - 11/27/14 0848    Subjective Feeling better.  I've had a good week.     Currently in Pain? No/denies   Pain Location Hip                         [facility] Adult PT Treatment/Exercise - 11/27/14 0001    Lumbar Exercises: Prone   Straight Leg Raise 20 reps   Other Prone Lumbar Exercises knee fleixon in prone for hip flexor stretch x 10   Knee/Hip Exercises: Stretches   Active Hamstring Stretch Left;3 reps;20 seconds   Hip Flexor Stretch 3 reps;20 seconds   Other  Knee/Hip Stretches foam roll psoas releasex 4 minutes                  PT Short Term Goals - 11/10/14 1642    PT SHORT TERM GOAL #1   Title be independent with initial HEP   Status Achieved   PT SHORT TERM GOAL #2   Title report a 30% reduction in Lt hip pain with sitting and standing   Status Achieved           PT Long Term Goals - 11/27/14 8676    PT LONG TERM GOAL #1   Title be independent in advanced HEP   Time 8   Period Weeks   Status On-going   PT LONG TERM GOAL #2   Title reduce FOTO to < or = to 38% limitation   Time 8   Period Weeks   Status On-going   PT LONG TERM GOAL #3   Title report a 60% reduction in Lt hip pain with sitting and standing   Time 8   Period Weeks   Status Achieved   PT LONG TERM GOAL #4   Title descend steps with 25% increased ease   Time 8   Period Weeks   Status Achieved  Plan - 11/27/14 0853    Clinical Impression Statement Pt reports 80% improved hip pain with standing and sitting.  Pt limits sitting at work due to pain with long sitting.  Pt is performing hip flexor stretches and release of hip flexor exercises.  Pt report 50% reduced hip pain with descending steps.  Pt will take ~2 weeks off from PT and see how her pain is over this time.     Pt will benefit from skilled therapeutic intervention in order to improve on the following deficits Impaired flexibility;Postural dysfunction;Increased muscle spasms;Pain;Decreased activity tolerance   Rehab Potential Good   PT Frequency 2x / week   PT Duration 8 weeks   PT Treatment/Interventions Moist Heat;Therapeutic activities;Patient/family education;Therapeutic exercise;Traction;Manual techniques;Ultrasound;Cryotherapy;Electrical Stimulation;Neuromuscular re-education;Passive range of motion;Iontophoresis 66m/ml Dexamethasone;Taping   PT Next Visit Plan Check in with pt in 2 weeks.  D/C if pain is still gone.   Consulted and Agree with Plan of Care Patient         Problem List Patient Active Problem List   Diagnosis Date Noted  . Osteoarthritis of right hip 10/27/2014  . Left-sided back pain 06/16/2014  . Anemia due to other cause 03/24/2014  . Routine general medical examination at a health care facility 03/24/2014  . Left knee pain 07/11/2013  . Folliculitis 016/11/9602 . VSD (ventricular septal defect) 03/09/2011  . SEBORRHEIC KERATOSIS, INFLAMED 03/12/2010  . ABSCESS, BREAST, LEFT 02/12/2009  . URTICARIA 06/03/2008  . SEBACEOUS CYST 10/09/2007  . ASTHMA 10/31/2006    TAKACS,KELLY, PT 11/27/2014, 9:15 AM PHYSICAL THERAPY DISCHARGE SUMMARY  Visits from Start of Care: 6  Current functional level related to goals / functional outcomes: Pt attended 6 PT sessions and was placed on hold for 2 weeks as she wasn't having pain.  Pt requested D/C to HEP.     Remaining deficits: No deficits remain per pt report.  Pt will continue with HEP.   Education / Equipment: HEP Plan: Patient agrees to discharge.  Patient goals were met. Patient is being discharged due to meeting the stated rehab goals.  ?????   KSigurd Sos PT 12/11/2014 8:41 AM  [facility] [address] [place] [address]: [phone]  Fax:  [phone]

## 2014-12-03 ENCOUNTER — Ambulatory Visit (INDEPENDENT_AMBULATORY_CARE_PROVIDER_SITE_OTHER): Payer: BLUE CROSS/BLUE SHIELD | Admitting: Family Medicine

## 2014-12-03 ENCOUNTER — Encounter: Payer: Self-pay | Admitting: Family Medicine

## 2014-12-03 VITALS — BP 112/87 | HR 95 | Temp 98.3°F | Ht 65.25 in | Wt 164.0 lb

## 2014-12-03 DIAGNOSIS — J4541 Moderate persistent asthma with (acute) exacerbation: Secondary | ICD-10-CM

## 2014-12-03 MED ORDER — HYDROCODONE-HOMATROPINE 5-1.5 MG/5ML PO SYRP: 5.0000 mL | ORAL_SOLUTION | ORAL | Status: DC | PRN

## 2014-12-03 MED ORDER — METHYLPREDNISOLONE ACETATE 80 MG/ML IJ SUSP
120.0000 mg | Freq: Once | INTRAMUSCULAR | Status: AC
  Administered 2014-12-03: 120 mg via INTRAMUSCULAR

## 2014-12-03 MED ORDER — LEVALBUTEROL TARTRATE 45 MCG/ACT IN AERO: 2.0000 | INHALATION_SPRAY | RESPIRATORY_TRACT | Status: DC | PRN

## 2014-12-03 NOTE — Progress Notes (Signed)
   Subjective:    Patient ID: Elizabeth Lozano, female    DOB: 06/15/1970, 44 y.o.   MRN: [id number]  HPI Here for one week of chest tightness, wheezing, and a dry cough. No fever or chest pain. Using her Xopenex inhaler 4-5 times a day.    Review of Systems  Constitutional: Negative.   HENT: Negative.   Eyes: Negative.   Respiratory: Positive for cough, chest tightness, shortness of breath and wheezing.   Cardiovascular: Negative.        Objective:   Physical Exam  Constitutional: She appears well-developed and well-nourished. No distress.  HENT:  Right Ear: External ear normal.  Left Ear: External ear normal.  Nose: Nose normal.  Mouth/Throat: Oropharynx is clear and moist.  Eyes: Conjunctivae are normal.  Neck: Neck supple. No thyromegaly present.  Cardiovascular: Normal rate, regular rhythm, normal heart sounds and intact distal pulses.   Pulmonary/Chest: Effort normal. No respiratory distress. She has no rales.  Scattered wheezes   Lymphadenopathy:    She has no cervical adenopathy.          Assessment & Plan:  Acute asthma flare. Given a steroid shot and she will start an oral prednisone taper. She has a supply at home and I advised her to go ahead a start on this. Use the inhaler prn.

## 2014-12-03 NOTE — Progress Notes (Signed)
Pre visit review using our clinic review tool, if applicable. No additional management support is needed unless otherwise documented below in the visit note. 

## 2014-12-09 ENCOUNTER — Ambulatory Visit (INDEPENDENT_AMBULATORY_CARE_PROVIDER_SITE_OTHER): Payer: BLUE CROSS/BLUE SHIELD | Admitting: Family Medicine

## 2014-12-09 ENCOUNTER — Encounter: Payer: Self-pay | Admitting: Family Medicine

## 2014-12-09 VITALS — BP 140/90 | HR 95 | Temp 98.6°F | Wt 164.0 lb

## 2014-12-09 DIAGNOSIS — R0989 Other specified symptoms and signs involving the circulatory and respiratory systems: Secondary | ICD-10-CM | POA: Insufficient documentation

## 2014-12-09 DIAGNOSIS — J989 Respiratory disorder, unspecified: Secondary | ICD-10-CM | POA: Insufficient documentation

## 2014-12-09 MED ORDER — HYDROCODONE-HOMATROPINE 5-1.5 MG/5ML PO SYRP: ORAL_SOLUTION | ORAL | Status: DC

## 2014-12-09 MED ORDER — PREDNISONE 20 MG PO TABS: ORAL_TABLET | ORAL | Status: DC

## 2014-12-09 MED ORDER — BECLOMETHASONE DIPROPIONATE 40 MCG/ACT IN AERS: 1.0000 | INHALATION_SPRAY | Freq: Two times a day (BID) | RESPIRATORY_TRACT | Status: DC

## 2014-12-09 NOTE — Progress Notes (Signed)
   Subjective:    Patient ID: Theda BelfastKaye Biancardi, female    DOB: 03/17/1970, 44 y.o.   MRN: [id number]  HPI [name] is a 44 year old married female nonsmoker who comes in today for follow-up of asthma  She's had an allergy workup by Dr. Gary FleetWhalen. She was told she has mold allergy. About 10 ago she started coughing. She came in was seen by Dr. Clent RidgesFry given a shot of steroids and placed on oral prednisone 40 mg daily. She says she is no better.   Review of Systems Review of systems otherwise negative except she was given Qvar to take twice daily by Dr. Gary FleetWhalen which she's not currently taking    Objective:   Physical Exam Well-developed well-nourished female no acute distress vital signs stable she's afebrile respiratory rate 12 and unlabored  Pulmonary exam normal no wheezing       Assessment & Plan:  Reactive airway disease,,,,,,,,, increase prednisone to 60 mg daily,,,,,,,,, restart the Qvar as she tapers the prednisone and advised to take one puff twice daily or year round in the future,,,,,,,,,, inhaler 3 times daily,,,,,, half a teaspoon of cough syrup 3 times daily,,,,,,,,, follow-up with Dr. Gary FleetWhalen if she doesn't improve in a week

## 2014-12-09 NOTE — Progress Notes (Signed)
Pre visit review using our clinic review tool, if applicable. No additional management support is needed unless otherwise documented below in the visit note. 

## 2014-12-09 NOTE — Patient Instructions (Signed)
Prednisone 20 mg,,,,,,,,,,, 3 tabs daily for 3 days then taper as outlined  Hydromet,,,,,,,,,,,, 1/2 teaspoon 3 times daily  Inhaler,,,,,,,,,,,, 3 times daily  When you begin to taper the prednisone restart the Qvar 40,,,,,,,,,,, 1 puff twice daily. If you don't see any improvement by Friday then I will call Dr. Gary FleetWhalen

## 2015-01-27 ENCOUNTER — Encounter: Payer: Self-pay | Admitting: Family Medicine

## 2015-01-27 ENCOUNTER — Ambulatory Visit (INDEPENDENT_AMBULATORY_CARE_PROVIDER_SITE_OTHER): Payer: BLUE CROSS/BLUE SHIELD | Admitting: Family Medicine

## 2015-01-27 VITALS — BP 110/80 | Temp 98.5°F | Wt 163.0 lb

## 2015-01-27 DIAGNOSIS — R202 Paresthesia of skin: Secondary | ICD-10-CM

## 2015-01-27 DIAGNOSIS — R209 Unspecified disturbances of skin sensation: Secondary | ICD-10-CM

## 2015-01-27 LAB — POCT GLUCOSE (DEVICE FOR HOME USE): POC GLUCOSE: 83 mg/dL (ref 70–99)

## 2015-01-27 NOTE — Progress Notes (Signed)
Pre visit review using our clinic review tool, if applicable. No additional management support is needed unless otherwise documented below in the visit note. 

## 2015-01-27 NOTE — Patient Instructions (Signed)
We will set you up a neurologic evaluation ASAP to try to explain your symptoms

## 2015-01-27 NOTE — Progress Notes (Signed)
   Subjective:    Patient ID: Elizabeth Lozano, female    DOB: 07/01/1970, 44 y.o.   MRN: [id number]  HPI Elizabeth Lozano is a 44 year old female who comes in today for evaluation of tingling in her right foot and pressure in her right eye for about 2 months  We saw her in October with a bout of reactive airway disease. We placed her on prednisone with a taper. She says while she was taking the prednisone she knows some tingling in her right foot and a pressure sensation in her right eye with it would come and go. She's been off the prednisone now for a month and the symptoms persist. She's concerned she may be diabetic. Random blood sugar today 83  Review of systems otherwise negative and her symptoms come and go. I cannot correlate anything with her symptoms.   Review of Systems Review of systems otherwise negative    Objective:   Physical Exam  Well-developed well-nourished female no acute distress vital signs stable she's afebrile      Assessment & Plan:  Symptoms of tingling in her right foot and pressure in her right eye the come and go etiology unknown........ neuro consult

## 2015-02-24 ENCOUNTER — Ambulatory Visit: Payer: BLUE CROSS/BLUE SHIELD | Admitting: Neurology

## 2015-06-29 ENCOUNTER — Ambulatory Visit (INDEPENDENT_AMBULATORY_CARE_PROVIDER_SITE_OTHER): Payer: BLUE CROSS/BLUE SHIELD | Admitting: Family Medicine

## 2015-06-29 ENCOUNTER — Encounter: Payer: Self-pay | Admitting: Family Medicine

## 2015-06-29 VITALS — BP 116/82 | HR 68 | Temp 98.5°F | Ht 65.25 in | Wt 168.3 lb

## 2015-06-29 DIAGNOSIS — L538 Other specified erythematous conditions: Secondary | ICD-10-CM | POA: Diagnosis not present

## 2015-06-29 MED ORDER — DESONIDE 0.05 % EX OINT: 1.0000 "application " | TOPICAL_OINTMENT | Freq: Two times a day (BID) | CUTANEOUS | Status: DC

## 2015-06-29 NOTE — Patient Instructions (Signed)
A few things to remember from today's visit:   1. Macular erythematous rash   I do not think it is psoriasis.  Could be part of rosacea and/or seborrheic dermatitis. Sun screen. Large brim hat. If not better or worse dermatology evaluation can be considered.    - desonide (DESOWEN) 0.05 % ointment; Apply 1 application topically 2 (two) times daily. For up to 2 weeks.  Dispense: 15 g; Refill: 0      If you sign-up for My chart, you can communicate easier with us in case you have any question or concern.

## 2015-06-29 NOTE — Progress Notes (Signed)
Subjective:    Patient ID: Elizabeth Lozano Lozano, female    DOB: 1970-06-12, 45 y.o.   MRN: [id number]  HPI   Elizabeth Lozano Lozano is a 45 y.o.female here today complaining of 2 weeks of erythematosus, no pruritic rash on face. She has other lesions on lower extremities, she is not sure if they are related. She has history of rosacea, which she reports as stable.  She describes lesions as plaque, not symptomatic. They seem to look better after she washes her face. No exacerbating factors identified.   She thinks lesions are psoriatic lesions, she has no prior history of psoriasis; no associated arthralgia, hair loss, or oral lesions. She also has a lesion on parieto- frontal scalp for 2-3 weeks, no pruritic, just scaly.  Lesion seem to be stable, no major changes since time of onset. She has followed with dermatology in the past for other skin conditions. No personal history of skin cancer.   She denies any new medication, detergent, soap, or body product. She washes her face once per week, states that she has sensitive skin, she does "exfoliate" her facial skin once a week.  No known insect bite or outdoor exposures to plants. No sick contact. No Hx of eczema or similar rash in the past. Hx of allergies and asthma. She has  not tried OTC medication. She  denies oral lesions/edema,cough, wheezing, dyspnea, abdominal pain, nausea, or vomiting. FHx positive for melanoma.   Review of Systems  Constitutional: Negative for fever, chills, appetite change, fatigue and unexpected weight change.  HENT: Negative for congestion, mouth sores, nosebleeds, sneezing, sore throat, trouble swallowing and voice change.   Eyes: Negative for redness and itching.  Respiratory: Negative for cough, shortness of breath and wheezing.   Cardiovascular: Negative for chest pain, palpitations and leg swelling.  Gastrointestinal: Negative for nausea, vomiting, abdominal pain and diarrhea.  Musculoskeletal: Negative for  myalgias, joint swelling, arthralgias and gait problem.  Skin: Positive for rash. Negative for wound.  Allergic/Immunologic: Positive for environmental allergies. Negative for food allergies.  Neurological: Negative for weakness, numbness and headaches.  Hematological: Negative for adenopathy. Does not bruise/bleed easily.     Current Outpatient Prescriptions on File Prior to Visit  Medication Sig Dispense Refill  . beclomethasone (QVAR) 40 MCG/ACT inhaler Inhale 1 puff into the lungs 2 (two) times daily. 3 Inhaler 4  . levalbuterol (XOPENEX HFA) 45 MCG/ACT inhaler Inhale 2 puffs into the lungs every 4 (four) hours as needed for wheezing. 1 Inhaler 2  . Multiple Vitamin (MULTIVITAMIN) tablet Take 1 tablet by mouth daily.     No current facility-administered medications on file prior to visit.     Past Medical History  Diagnosis Date  . Asthma   . VSD (ventricular septal defect)     Cardiac CTA 05/2008: Membranous VSD, 8-9 mm in diameter, mild RVE, EF 64%, calcium score 0, no CAD. Last echo 02/2011: EF 55-60%, mild CAD, perimembranous VSD slightly more prominent but no RVE or pulmonary hypertension;  Echo (9/14): Small perimembranous VSD-no significant change since 02/2011; EF 55-60%, moderate LAE  . History of stress test     ETT-echo (9/14):  ST depression noted on ECGs; no wall motion abnormalities at peak exercise on echocardiogram  . Allergy     she has seen Dr. Eileen Stanford     Social History   Social History  . Marital Status: Divorced    Spouse Name: N/A  . Number of Children: N/A  . Years of Education:  N/A   Social History Main Topics  . Smoking status: Never Smoker   . Smokeless tobacco: Never Used  . Alcohol Use: 2.4 oz/week    4 Standard drinks or equivalent per week  . Drug Use: No  . Sexual Activity: Not Asked   Other Topics Concern  . None   Social History Narrative    Filed Vitals:   06/29/15 0902  BP: 116/82  Pulse: 68  Temp: 98.5 F (36.9 C)    Body mass index is 27.81 kg/(m^2).      Objective:   Physical Exam  Constitutional: She is oriented to person, place, and time. She appears well-developed and well-nourished. She does not appear ill. No distress.  HENT:  Head: Atraumatic.    Mouth/Throat: Oropharynx is clear and moist and mucous membranes are normal. No oropharyngeal exudate.  Eyes: Conjunctivae are normal.  Pulmonary/Chest: Effort normal and breath sounds normal. No respiratory distress. She has no wheezes. She has no rales.  Musculoskeletal: She exhibits no edema or tenderness.  Lymphadenopathy:    She has no cervical adenopathy.  Neurological: She is alert and oriented to person, place, and time. Coordination and gait normal.  Skin: Skin is warm. Rash noted. No purpura noted. Rash is macular. Rash is not nodular, not pustular and not vesicular. There is erythema.     On face a total of 3 mildly scaly, rounded (1 cm), macular erythematous lesions: 2 on left cheek and 1 on right cheek. No tender no indurated, no locla heat. She has macular rash on cheeks, nose bridge, mid forehead and chin.  Erythematous 2-3 mm, scaly ,rounded lesions on pretibial aspect, one on each one, proximal, no tender, no indurated, no hot. Lesions have defined borders. See head.  Psychiatric: She has a normal mood and affect.  Well groomed, good eye contact.  Nursing note and vitals reviewed.      Assessment & Plan:    [name] was seen today for eczema.  Diagnoses and all orders for this visit:  Macular erythematous rash -     desonide (DESOWEN) 0.05 % ointment; Apply 1 application topically 2 (two) times daily. For up to 2 weeks.   Lesions I see today do not suggest psoriasis, they could be part of her rosacea or related with sun exposure. Other possible causes: seborrheic dermatitis (given small lesion on scalp). I recommended sunscreen and avoidance of direct sun light. Some side effects of topical steroid were discussed, she  agrees with trying a low streight topical steroid ointment for up to 2 weeks. If not better or lesions getting worse we could consider dermatology evaluation. Follow-up as needed.    -Patient advised to return or notify a doctor immediately if symptoms worsen or persist or new concerns arise.    Raykwon Hobbs G. [place], MD  [facility]. [facility] office.

## 2015-06-29 NOTE — Progress Notes (Signed)
Pre visit review using our clinic review tool, if applicable. No additional management support is needed unless otherwise documented below in the visit note. 

## 2015-08-28 ENCOUNTER — Ambulatory Visit: Payer: BLUE CROSS/BLUE SHIELD | Admitting: Family Medicine

## 2015-08-30 ENCOUNTER — Encounter: Payer: Self-pay | Admitting: Family Medicine

## 2015-09-08 ENCOUNTER — Other Ambulatory Visit: Payer: Self-pay | Admitting: Family Medicine

## 2015-09-08 DIAGNOSIS — L989 Disorder of the skin and subcutaneous tissue, unspecified: Secondary | ICD-10-CM

## 2015-10-06 ENCOUNTER — Encounter: Payer: Self-pay | Admitting: Family Medicine

## 2015-12-03 ENCOUNTER — Emergency Department (HOSPITAL_BASED_OUTPATIENT_CLINIC_OR_DEPARTMENT_OTHER)
Admission: EM | Admit: 2015-12-03 | Discharge: 2015-12-03 | Disposition: A | Discharge status: Home / Self Care | Payer: BLUE CROSS/BLUE SHIELD | Attending: Emergency Medicine | Admitting: Emergency Medicine

## 2015-12-03 ENCOUNTER — Encounter (HOSPITAL_BASED_OUTPATIENT_CLINIC_OR_DEPARTMENT_OTHER): Payer: Self-pay | Admitting: *Deleted

## 2015-12-03 DIAGNOSIS — R6883 Chills (without fever): Secondary | ICD-10-CM | POA: Insufficient documentation

## 2015-12-03 DIAGNOSIS — N939 Abnormal uterine and vaginal bleeding, unspecified: Secondary | ICD-10-CM | POA: Insufficient documentation

## 2015-12-03 DIAGNOSIS — Z7951 Long term (current) use of inhaled steroids: Secondary | ICD-10-CM | POA: Insufficient documentation

## 2015-12-03 DIAGNOSIS — Z79899 Other long term (current) drug therapy: Secondary | ICD-10-CM | POA: Diagnosis not present

## 2015-12-03 DIAGNOSIS — J45909 Unspecified asthma, uncomplicated: Secondary | ICD-10-CM | POA: Diagnosis not present

## 2015-12-03 LAB — CBC WITH DIFFERENTIAL/PLATELET
BASOS ABS: 0 10*3/uL (ref 0.0–0.1)
Basophils Relative: 1 %
Eosinophils Absolute: 0.2 10*3/uL (ref 0.0–0.7)
Eosinophils Relative: 2 %
HEMATOCRIT: 33 % — AB (ref 36.0–46.0)
Hemoglobin: 10.7 g/dL — ABNORMAL LOW (ref 12.0–15.0)
LYMPHS ABS: 3.7 10*3/uL (ref 0.7–4.0)
LYMPHS PCT: 43 %
MCH: 26.4 pg (ref 26.0–34.0)
MCHC: 32.4 g/dL (ref 30.0–36.0)
MCV: 81.5 fL (ref 78.0–100.0)
MONO ABS: 0.6 10*3/uL (ref 0.1–1.0)
MONOS PCT: 7 %
NEUTROS ABS: 4.1 10*3/uL (ref 1.7–7.7)
Neutrophils Relative %: 47 %
Platelets: 246 10*3/uL (ref 150–400)
RBC: 4.05 MIL/uL (ref 3.87–5.11)
RDW: 16.6 % — AB (ref 11.5–15.5)
WBC: 8.6 10*3/uL (ref 4.0–10.5)

## 2015-12-03 LAB — PREGNANCY, URINE: PREG TEST UR: NEGATIVE

## 2015-12-03 MED ORDER — NORGESTIMATE-ETH ESTRADIOL 0.25-35 MG-MCG PO TABS: 1.0000 | ORAL_TABLET | Freq: Every day | ORAL | 0 refills | Status: DC

## 2015-12-03 NOTE — ED Notes (Signed)
Pt reports abnormal vag bleeding with use of one tampoon an hour with large clots

## 2015-12-03 NOTE — ED Triage Notes (Addendum)
Heavy vaginal bleeding since 2 am. She had bleeding 2 weeks ago. Her menses had stopped in June and her GYN told her she was going through menopause.

## 2015-12-03 NOTE — ED Provider Notes (Addendum)
[facility]-EMER[facility]Y DEPT Provider Note   CSN: [id number] Arrival date & time: 12/03/15  1930 By signing my name below, I, Elizabeth Lozano, attest that this documentation has been prepared under the direction and in the presence of No att. providers found . Electronically Signed: Levon Lozano, Scribe. 12/03/2015. 10:23 PM.   History   Chief Complaint Chief Complaint  Patient presents with  . Vaginal Bleeding   HPI Elizabeth Lozano is a 45 y.o. female who presents to the Emergency Department complaining of heavy vaginal bleeding since 2 this AM. Pt describes the blood as bright red. She states she is bleeding through  super plus tampon every hour and states she is passing large clots. She notes associated lightheadedness and chills. Pt states she feels cold in her extremities. Per pt, her menses stopped a few months ago and she is currently going through menopause. She has never experience this before. Pt denies abdominal cramping, nausea, vomiting, or pallor. Pt denies any trauma.   Pt is followed by Dr. Renaldo Fiddler at [facility] for gynecological care.   The history is provided by the patient. No language interpreter was used.   Past Medical History:  Diagnosis Date  . Allergy    she has seen Dr. Eileen Stanford   . Asthma   . History of stress test    ETT-echo (9/14):  ST depression noted on ECGs; no wall motion abnormalities at peak exercise on echocardiogram  . VSD (ventricular septal defect)    Cardiac CTA 05/2008: Membranous VSD, 8-9 mm in diameter, mild RVE, EF 64%, calcium score 0, no CAD. Last echo 02/2011: EF 55-60%, mild CAD, perimembranous VSD slightly more prominent but no RVE or pulmonary hypertension;  Echo (9/14): Small perimembranous VSD-no significant change since 02/2011; EF 55-60%, moderate LAE   Patient Active Problem List   Diagnosis Date Noted  . Abnormal sensation of leg 01/27/2015  . Reactive airway disease that is not asthma 12/09/2014  . Osteoarthritis of right hip  10/27/2014  . Left-sided back pain 06/16/2014  . Anemia due to other cause 03/24/2014  . Routine general medical examination at a health care facility 03/24/2014  . Left knee pain 07/11/2013  . Folliculitis 07/11/2013  . VSD (ventricular septal defect) 03/09/2011  . SEBORRHEIC KERATOSIS, INFLAMED 03/12/2010  . ABSCESS, BREAST, LEFT 02/12/2009  . URTICARIA 06/03/2008  . SEBACEOUS CYST 10/09/2007  . Asthma 10/31/2006    Past Surgical History:  Procedure Laterality Date  . cyst left wrist     OB History    No data available     Home Medications    Prior to Admission medications   Medication Sig Start Date End Date Taking? Authorizing Provider  cetirizine (ZYRTEC) 10 MG tablet Take 10 mg by mouth daily.   Yes Historical Provider, MD  Multiple Vitamin (MULTIVITAMIN) tablet Take 1 tablet by mouth daily.   Yes Historical Provider, MD  beclomethasone (QVAR) 40 MCG/ACT inhaler Inhale 1 puff into the lungs 2 (two) times daily. 12/09/14   Roderick Pee, MD  desonide (DESOWEN) 0.05 % ointment Apply 1 application topically 2 (two) times daily. For up to 2 weeks. 06/29/15   Betty G [place], MD  levalbuterol [facility] HFA) 45 MCG/ACT inhaler Inhale 2 puffs into the lungs every 4 (four) hours as needed for wheezing. 12/03/14   Nelwyn Salisbury, MD  norgestimate-ethinyl estradiol (SPRINTEC 28) 0.25-35 MG-MCG tablet Take 1 tablet by mouth daily. Take three tablets for two days.  Take two tablets for two days  Take 1 tablet until seen by doctor. 12/03/15   Marily MemosJason Melaine Mcphee, MD   Family History Family History  Problem Relation Age of Onset  . Sarcoidosis Mother   . Heart attack Father   . Hypertension Father   . Hyperlipidemia Father   . Healthy Brother   . Alcohol abuse Other   . Arthritis Other   . Hyperlipidemia Other   . Hypertension Other   . Cancer Other     prostate  . Heart disease Other   . Melanoma Other    Social History Social History  Substance Use Topics  . Smoking status:  Never Smoker  . Smokeless tobacco: Never Used  . Alcohol use 2.4 oz/week    4 Standard drinks or equivalent per week   Allergies   Erythromycin; Iohexol; and Tetracycline  Review of Systems Review of Systems  Constitutional: Positive for chills.  Gastrointestinal: Negative for abdominal pain, nausea and vomiting.  Genitourinary: Positive for vaginal bleeding.  Skin: Negative for pallor.  Neurological: Positive for light-headedness.  All other systems reviewed and are negative.  Physical Exam Updated Vital Signs BP 130/86 (BP Location: Right Arm)   Pulse 70   Temp 97.5 F (36.4 C) (Oral)   Resp 18   Ht 5\' 6"  (1.676 m)   Wt 163 lb (73.9 kg)   SpO2 100%   BMI 26.31 kg/m   Physical Exam  Constitutional: She is oriented to person, place, and time. She appears well-developed and well-nourished. No distress.  HENT:  Head: Normocephalic and atraumatic.  Eyes: Conjunctivae are normal.  Neck: Normal range of motion.  Cardiovascular: Normal rate.   Murmur heard.  Systolic murmur is present with a grade of 4/6  Pulmonary/Chest: Effort normal. No respiratory distress. She has no wheezes.  Abdominal: She exhibits no distension.  Neurological: She is alert and oriented to person, place, and time.  Skin: Skin is warm and dry.  Psychiatric: She has a normal mood and affect.  Nursing note and vitals reviewed.  ED Treatments / Results  DIAGNOSTIC STUDIES:  Oxygen Saturation is 100% on RA, normal by my interpretation.    COORDINATION OF CARE:  10:20 PM Discussed treatment plan with pt at bedside and pt agreed to plan.   Labs (all labs ordered are listed, but only abnormal results are displayed) Labs Reviewed  CBC WITH DIFFERENTIAL/PLATELET - Abnormal; Notable for the following:       Result Value   Hemoglobin 10.7 (*)    HCT 33.0 (*)    RDW 16.6 (*)    All other components within normal limits  PREGNANCY, URINE   EKG  EKG Interpretation None      Radiology No  results found.  Procedures Procedures (including critical care time)  Medications Ordered in ED Medications - No data to display  Initial Impression / Assessment and Plan / ED Course  I have reviewed the triage vital signs and the nursing notes.  Pertinent labs & imaging results that were available during my care of the patient were reviewed by me and considered in my medical decision making (see chart for details).  Clinical Course   Heavy vaginal bleeding but without hemodynamic instability or significant drop in hemoglobin. Has been a little dizzy ut no other e/o acute blood loss anemia. Discussed with her on call gynecologist and patient is to take oral contraceptives to slow bleeding and follow up with Dr. Renaldo FiddlerAdkins next week.   Final Clinical Impressions(s) / ED Diagnoses   Final diagnoses:  Vaginal bleeding   New Prescriptions Discharge Medication List as of 12/03/2015 11:25 PM    START taking these medications   Details  norgestimate-ethinyl estradiol (SPRINTEC 28) 0.25-35 MG-MCG tablet Take 1 tablet by mouth daily. Take three tablets for two days.  Take two tablets for two days Take 1 tablet until seen by doctor., Starting Thu 12/03/2015, Print      I personally performed the services described in this documentation, which was scribed in my presence. The recorded information has been reviewed and is accurate.     Marily Memos, MD 12/05/15 1148    Marily Memos, MD 12/05/15 [id number]

## 2016-04-05 NOTE — Progress Notes (Signed)
Cardiology Office Note   Date:  04/15/2016   ID:  Elizabeth Lozano, DOB 1970/10/31, MRN [id number]  PCP:  Elizabeth Georges, MD  Cardiologist:   Elizabeth Haws, MD   Chief Complaint  Patient presents with  . Ventricular Septal Defect  . Establish Care      History of Present Illness: Elizabeth Lozano is a 46 y.o. female who presents for murmur and VSD Last seen by cardiology 2013.  10/26/12 had Stress echo with positive ECG response but normal echo images. TTE 10/26/12 with small perimembranous VSD no change From 2013 no pulmonary hypertension and normal EF 55-60% Patient prefers to have SBE prophylaxis   Lots of job stress at [employer]. BP has been high. Doing Paleo type diet. Occasional  ETOH  No other stimulants   No dyspnea palpitations or   Past Medical History:  Diagnosis Date  . Allergy    she has seen Dr. Eileen Lozano   . Asthma   . History of stress test    ETT-echo (9/14):  ST depression noted on ECGs; no wall motion abnormalities at peak exercise on echocardiogram  . VSD (ventricular septal defect)    Cardiac CTA 05/2008: Membranous VSD, 8-9 mm in diameter, mild RVE, EF 64%, calcium score 0, no CAD. Last echo 02/2011: EF 55-60%, mild CAD, perimembranous VSD slightly more prominent but no RVE or pulmonary hypertension;  Echo (9/14): Small perimembranous VSD-no significant change since 02/2011; EF 55-60%, moderate LAE    Past Surgical History:  Procedure Laterality Date  . cyst left wrist       Current Outpatient Prescriptions  Medication Sig Dispense Refill  . cetirizine (ZYRTEC) 10 MG tablet Take 10 mg by mouth daily.    Marland Kitchen levalbuterol (XOPENEX HFA) 45 MCG/ACT inhaler Inhale 2 puffs into the lungs every 4 (four) hours as needed for wheezing. 1 Inhaler 2  . Levonorgestrel-Ethinyl Estradiol (AMETHIA,CAMRESE) 0.15-0.03 &0.01 MG tablet Take 1 tablet by mouth daily.    Marland Kitchen MAGNESIUM PO Take 1 tablet by mouth daily.    . Multiple Vitamin (MULTIVITAMIN) tablet Take 1 tablet by  mouth daily.    Marland Kitchen amLODipine (NORVASC) 10 MG tablet Take 1 tablet (10 mg total) by mouth daily. 90 tablet 3   No current facility-administered medications for this visit.     Allergies:   Erythromycin; Iohexol; and Tetracycline    Social History:  The patient  reports that she has never smoked. She has never used smokeless tobacco. She reports that she drinks about 2.4 oz of alcohol per week . She reports that she does not use drugs.   Family History:  The patient's family history includes Alcohol abuse in her other; Arthritis in her other; Cancer in her other; Healthy in her brother; Heart attack in her father; Heart disease in her other; Hyperlipidemia in her father and other; Hypertension in her father and other; Melanoma in her other; Sarcoidosis in her mother.    ROS:  Please see the history of present illness.   Otherwise, review of systems are positive for none.   All other systems are reviewed and negative.    PHYSICAL EXAM: VS:  BP (!) 140/100   Pulse 74   Ht 5\' 6"  (1.676 m)   Wt 167 lb 12.8 oz (76.1 kg)   SpO2 91%   BMI 27.08 kg/m  , BMI Body mass index is 27.08 kg/m. Affect appropriate Healthy:  appears stated age HEENT: normal Neck supple with no adenopathy JVP normal no  bruits no thyromegaly Lungs clear with no wheezing and good diaphragmatic motion Heart:  S1/S2  Loud VSD  murmur, no rub, gallop or click PMI normal Abdomen: benighn, BS positve, no tenderness, no AAA no bruit.  No HSM or HJR Distal pulses intact with no bruits No edema Neuro non-focal Skin warm and dry No muscular weakness    EKG:  NSR rate 67 ICRBBB RAD otherwise  normal ECG 04/15/16  SR rate 74 RAD ICRBBB    Recent Labs: 12/03/2015: Hemoglobin 10.7; Platelets 246    Lipid Panel    Component Value Date/Time   CHOL 180 03/20/2014 0932   TRIG 69.0 03/20/2014 0932   HDL 53.60 03/20/2014 0932   CHOLHDL 3 03/20/2014 0932   VLDL 13.8 03/20/2014 0932   LDLCALC 113 (H) 03/20/2014 0932       Wt Readings from Last 3 Encounters:  04/15/16 167 lb 12.8 oz (76.1 kg)  12/03/15 163 lb (73.9 kg)  06/29/15 168 lb 5 oz (76.3 kg)      Other studies Reviewed: Additional studies/ records that were reviewed today include: Notes cards 2013 echo 2014 Notes and labs primary Dr Elizabeth Lozano and notes OB/GYN.    ASSESSMENT AND PLAN:  1.  VSD/Murmur f/u echo r/o pulmonary hypertension assess RV/LV function  2.  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 Vaginal bleeding:  Seen in ER 12/03/15 f/u Dr Elizabeth Lozano Rx with BCP's 4. HTN related to job stress start norvasc 10 mg f/u Dr Elizabeth Lozano   Current medicines are reviewed at length with the patient today.  The patient does not have concerns regarding medicines.  The following changes have been made:  Norvasc 10 mg   Labs/ tests ordered today include: Echo   Orders Placed This Encounter  Procedures  . EKG 12-Lead  . ECHOCARDIOGRAM COMPLETE     Disposition:   FU with cards in a year      Signed, Elizabeth Hawseter Elizabeth Kilbride, MD  04/15/2016 10:34 AM    [facility] [address], [place], [place]  [id number] Phone: [phone]; Fax: [phone]

## 2016-04-06 ENCOUNTER — Encounter: Payer: Self-pay | Admitting: Cardiovascular Disease

## 2016-04-15 ENCOUNTER — Ambulatory Visit (INDEPENDENT_AMBULATORY_CARE_PROVIDER_SITE_OTHER): Payer: BLUE CROSS/BLUE SHIELD | Admitting: Cardiovascular Disease

## 2016-04-15 ENCOUNTER — Encounter: Payer: Self-pay | Admitting: Cardiovascular Disease

## 2016-04-15 VITALS — BP 140/100 | HR 74 | Ht 66.0 in | Wt 167.8 lb

## 2016-04-15 DIAGNOSIS — Q210 Ventricular septal defect: Secondary | ICD-10-CM

## 2016-04-15 DIAGNOSIS — Z7689 Persons encountering health services in other specified circumstances: Secondary | ICD-10-CM

## 2016-04-15 MED ORDER — AMLODIPINE BESYLATE 10 MG PO TABS: 10.0000 mg | ORAL_TABLET | Freq: Every day | ORAL | 3 refills | Status: DC

## 2016-04-15 NOTE — Patient Instructions (Addendum)
Medication Instructions:  Your physician has recommended you make the following change in your medication:  1-Norvasc 10 mg by mouth daily  Labwork: NONE  Testing/Procedures: Your physician has requested that you have an echocardiogram. Echocardiography is a painless test that uses sound waves to create images of your heart. It provides your doctor with information about the size and shape of your heart and how well your heart's chambers and valves are working. This procedure takes approximately one hour. There are no restrictions for this procedure.  Follow-Up: Your physician wants you to follow-up in: 12 months with Dr. Eden EmmsNishan. You will receive a reminder letter in the mail two months in advance. If you don't receive a letter, please call our office to schedule the follow-up appointment.   If you need a refill on your cardiac medications before your next appointment, please call your pharmacy.

## 2016-04-26 ENCOUNTER — Telehealth: Payer: Self-pay | Admitting: Family Medicine

## 2016-04-26 ENCOUNTER — Other Ambulatory Visit: Payer: Self-pay | Admitting: Family Medicine

## 2016-04-26 DIAGNOSIS — Z0000 Encounter for general adult medical examination without abnormal findings: Secondary | ICD-10-CM

## 2016-04-26 NOTE — Telephone Encounter (Signed)
No need to schedule.  Message no longer needed.  Will close.

## 2016-04-26 NOTE — Telephone Encounter (Signed)
Pt has cpx scheduled with Tawanna Coolerodd on Monday.  Needs labs before she comes.  Please help her to make an appt.  Thanks!!

## 2016-04-27 ENCOUNTER — Other Ambulatory Visit: Payer: BLUE CROSS/BLUE SHIELD

## 2016-04-28 ENCOUNTER — Other Ambulatory Visit (INDEPENDENT_AMBULATORY_CARE_PROVIDER_SITE_OTHER): Payer: BLUE CROSS/BLUE SHIELD

## 2016-04-28 DIAGNOSIS — Z0000 Encounter for general adult medical examination without abnormal findings: Secondary | ICD-10-CM

## 2016-04-28 LAB — CBC WITH DIFFERENTIAL/PLATELET
BASOS ABS: 0.1 10*3/uL (ref 0.0–0.1)
Basophils Relative: 1.4 % (ref 0.0–3.0)
Eosinophils Absolute: 0.1 10*3/uL (ref 0.0–0.7)
Eosinophils Relative: 2.3 % (ref 0.0–5.0)
HEMATOCRIT: 31.4 % — AB (ref 36.0–46.0)
Hemoglobin: 10 g/dL — ABNORMAL LOW (ref 12.0–15.0)
LYMPHS PCT: 33.9 % (ref 12.0–46.0)
Lymphs Abs: 1.8 10*3/uL (ref 0.7–4.0)
MCHC: 31.8 g/dL (ref 30.0–36.0)
MCV: 74.2 fl — AB (ref 78.0–100.0)
MONOS PCT: 6.8 % (ref 3.0–12.0)
Monocytes Absolute: 0.4 10*3/uL (ref 0.1–1.0)
Neutro Abs: 3 10*3/uL (ref 1.4–7.7)
Neutrophils Relative %: 55.6 % (ref 43.0–77.0)
Platelets: 305 10*3/uL (ref 150.0–400.0)
RBC: 4.23 Mil/uL (ref 3.87–5.11)
RDW: 17.8 % — ABNORMAL HIGH (ref 11.5–15.5)
WBC: 5.4 10*3/uL (ref 4.0–10.5)

## 2016-04-28 LAB — BASIC METABOLIC PANEL
BUN: 12 mg/dL (ref 6–23)
CHLORIDE: 104 meq/L (ref 96–112)
CO2: 27 meq/L (ref 19–32)
Calcium: 9.3 mg/dL (ref 8.4–10.5)
Creatinine, Ser: 1.03 mg/dL (ref 0.40–1.20)
GFR: 61.02 mL/min (ref >60.00)
Glucose, Bld: 87 mg/dL (ref 70–99)
Potassium: 4.5 mEq/L (ref 3.5–5.1)
SODIUM: 139 meq/L (ref 135–145)

## 2016-04-28 LAB — POC URINALSYSI DIPSTICK (AUTOMATED)
BILIRUBIN UA: NEGATIVE
Glucose, UA: NEGATIVE
Ketones, UA: NEGATIVE
Leukocytes, UA: NEGATIVE
NITRITE UA: NEGATIVE
PH UA: 7
SPEC GRAV UA: 1.025
UROBILINOGEN UA: 0.2

## 2016-04-28 LAB — HEPATIC FUNCTION PANEL
ALBUMIN: 3.8 g/dL (ref 3.5–5.2)
ALK PHOS: 35 U/L — AB (ref 39–117)
ALT: 9 U/L (ref 0–35)
AST: 13 U/L (ref 0–37)
Bilirubin, Direct: 0.1 mg/dL (ref 0.0–0.3)
TOTAL PROTEIN: 6.9 g/dL (ref 6.0–8.3)
Total Bilirubin: 0.3 mg/dL (ref 0.2–1.2)

## 2016-04-28 LAB — TSH: TSH: 1.88 u[IU]/mL (ref 0.35–4.50)

## 2016-04-28 LAB — LIPID PANEL
CHOL/HDL RATIO: 4
Cholesterol: 184 mg/dL (ref 0–200)
HDL: 51.7 mg/dL (ref >39.00)
LDL Cholesterol: 114 mg/dL — ABNORMAL HIGH (ref 0–99)
NONHDL: 132.2
TRIGLYCERIDES: 93 mg/dL (ref 0.0–149.0)
VLDL: 18.6 mg/dL (ref 0.0–40.0)

## 2016-04-29 ENCOUNTER — Other Ambulatory Visit: Payer: Self-pay

## 2016-04-29 ENCOUNTER — Ambulatory Visit (HOSPITAL_COMMUNITY): Payer: BLUE CROSS/BLUE SHIELD | Attending: Cardiology

## 2016-04-29 DIAGNOSIS — Q210 Ventricular septal defect: Secondary | ICD-10-CM | POA: Insufficient documentation

## 2016-05-02 ENCOUNTER — Encounter: Payer: BLUE CROSS/BLUE SHIELD | Admitting: Family Medicine

## 2016-05-02 ENCOUNTER — Telehealth: Payer: Self-pay | Admitting: Cardiovascular Disease

## 2016-05-02 NOTE — Telephone Encounter (Signed)
This was already sent in as below. I called the patient and she states that when [facility] called [facility] she was informed that they did not have the rx. She is aware that I will [facility] to follow up on this and ask that they get this ready for her to pick up. I have called and spoke with the pharmacist and they will get this ready for the patient.  Medication Detail    Disp Refills Start End   amLODipine (NORVASC) 10 MG tablet 90 tablet 3 04/15/2016 07/14/2016   Sig - Route: Take 1 tablet (10 mg total) by mouth daily. - Oral   E-Prescribing Status: Receipt confirmed by pharmacy (04/15/2016 10:30 AM EST)   Pharmacy   [id number] - [place], [place] - [address]

## 2016-05-02 NOTE — Telephone Encounter (Signed)
New message  Pt states [facility] and it needs to be resent to the pharmacy  *STAT* If patient is at the pharmacy, call can be transferred to refill team.   1. Which medications need to be refilled? (please list name of each medication and dose if known) NORVASC  2. Which pharmacy/location (including street and city if local pharmacy) is medication to be sent to? [facility] [place]  3. Do they need a 30 day or 90 day supply? 90 day supply

## 2016-05-03 ENCOUNTER — Encounter: Payer: Self-pay | Admitting: Family Medicine

## 2016-05-03 ENCOUNTER — Ambulatory Visit (INDEPENDENT_AMBULATORY_CARE_PROVIDER_SITE_OTHER): Payer: BLUE CROSS/BLUE SHIELD | Admitting: Family Medicine

## 2016-05-03 DIAGNOSIS — I10 Essential (primary) hypertension: Secondary | ICD-10-CM | POA: Diagnosis not present

## 2016-05-03 NOTE — Progress Notes (Signed)
Subjective:     Patient ID: Elizabeth Lozano, female   DOB: 08/28/1970, 46 y.o.   MRN: [id number]  HPI Patient here to discuss elevated blood pressure issues. She has history of VSD. She had some recent readings around 140/100 and she was placed on amlodipine last week per cardiology.  She never started medication. She's been monitoring her blood pressure closely at home with automated cuff and has gotten average systolic of 128 and 93 diastolic. No headaches. No dizziness. No chest pains. She has history of ventricular septal defect. She prefers to work on lifestyle modification vs medication at this time. Currently no aerobic exercise. She is watching her sodium intake diligently. Rare alcohol use.  Past Medical History:  Diagnosis Date  . Allergy    she has seen Dr. Eileen StanfordMeg Whelan   . Asthma   . History of stress test    ETT-echo (9/14):  ST depression noted on ECGs; no wall motion abnormalities at peak exercise on echocardiogram  . VSD (ventricular septal defect)    Cardiac CTA 05/2008: Membranous VSD, 8-9 mm in diameter, mild RVE, EF 64%, calcium score 0, no CAD. Last echo 02/2011: EF 55-60%, mild CAD, perimembranous VSD slightly more prominent but no RVE or pulmonary hypertension;  Echo (9/14): Small perimembranous VSD-no significant change since 02/2011; EF 55-60%, moderate LAE   Past Surgical History:  Procedure Laterality Date  . cyst left wrist      reports that she has never smoked. She has never used smokeless tobacco. She reports that she drinks about 2.4 oz of alcohol per week . She reports that she does not use drugs. family history includes Alcohol abuse in her other; Arthritis in her other; Cancer in her other; Healthy in her brother; Heart attack in her father; Heart disease in her other; Hyperlipidemia in her father and other; Hypertension in her father and other; Melanoma in her other; Sarcoidosis in her mother. Allergies  Allergen Reactions  . Erythromycin Nausea And Vomiting  . Iohexol  Hives     Code: HIVES, Desc: RN AMY NOTICED HIVE ON STOMACH AFTER HEART SCAN. ALSO ONE ON BACK. NOT ITCHING UNTIL AFTER SHE KNEW OF HIVES. DR.NISHAN NOTIFIED. GIVEN BENADRYL., Onset Date: [id number]   . Tetracycline Hives     Review of Systems  Constitutional: Negative for fatigue.  Eyes: Negative for visual disturbance.  Respiratory: Negative for cough, chest tightness, shortness of breath and wheezing.   Cardiovascular: Negative for chest pain, palpitations and leg swelling.  Neurological: Negative for dizziness, seizures, syncope, weakness, light-headedness and headaches.       Objective:   Physical Exam  Constitutional: She appears well-developed and well-nourished.  Eyes: Pupils are equal, round, and reactive to light.  Neck: Neck supple. No JVD present. No thyromegaly present.  Cardiovascular: Normal rate and regular rhythm.  Exam reveals no gallop.   Pulmonary/Chest: Effort normal and breath sounds normal. No respiratory distress. She has no wheezes. She has no rales.  Musculoskeletal: She exhibits no edema.  Neurological: She is alert.       Assessment:     Elevated blood pressure with probable hypertension and question of whitecoat syndrome. She has had consistently better readings at home and blood pressure today was initially 140/90 and repeat left arm seated after rest 120/90    Plan:     -She is encouraged to start regular aerobic activity -lose a few pounds -Watch sodium intake closely -Monitor blood pressure and follow-up in 2 months to reassess with primary and  bring her cuff in to assess that time  Kristian Covey MD [facility]

## 2016-05-03 NOTE — Progress Notes (Signed)
Pre visit review using our clinic review tool, if applicable. No additional management support is needed unless otherwise documented below in the visit note. 

## 2016-05-03 NOTE — Patient Instructions (Signed)
DASH Eating Plan DASH stands for "Dietary Approaches to Stop Hypertension." The DASH eating plan is a healthy eating plan that has been shown to reduce high blood pressure (hypertension). It may also reduce your risk for type 2 diabetes, heart disease, and stroke. The DASH eating plan may also help with weight loss. What are tips for following this plan? General guidelines  Avoid eating more than 2,300 mg (milligrams) of salt (sodium) a day. If you have hypertension, you may need to reduce your sodium intake to 1,500 mg a day.  Limit alcohol intake to no more than 1 drink a day for nonpregnant women and 2 drinks a day for men. One drink equals 12 oz of beer, 5 oz of wine, or 1 oz of hard liquor.  Work with your health care provider to maintain a healthy body weight or to lose weight. Ask what an ideal weight is for you.  Get at least 30 minutes of exercise that causes your heart to beat faster (aerobic exercise) most days of the week. Activities may include walking, swimming, or biking.  Work with your health care provider or diet and nutrition specialist (dietitian) to adjust your eating plan to your individual calorie needs. Reading food labels  Check food labels for the amount of sodium per serving. Choose foods with less than 5 percent of the Daily Value of sodium. Generally, foods with less than 300 mg of sodium per serving fit into this eating plan.  To find whole grains, look for the word "whole" as the first word in the ingredient list. Shopping  Buy products labeled as "low-sodium" or "no salt added."  Buy fresh foods. Avoid canned foods and premade or frozen meals. Cooking  Avoid adding salt when cooking. Use salt-free seasonings or herbs instead of table salt or sea salt. Check with your health care provider or pharmacist before using salt substitutes.  Do not fry foods. Cook foods using healthy methods such as baking, boiling, grilling, and broiling instead.  Cook with  heart-healthy oils, such as olive, canola, soybean, or sunflower oil. Meal planning   Eat a balanced diet that includes: ? 5 or more servings of fruits and vegetables each day. At each meal, try to fill half of your plate with fruits and vegetables. ? Up to 6-8 servings of whole grains each day. ? Less than 6 oz of lean meat, poultry, or fish each day. A 3-oz serving of meat is about the same size as a deck of cards. One egg equals 1 oz. ? 2 servings of low-fat dairy each day. ? A serving of nuts, seeds, or beans 5 times each week. ? Heart-healthy fats. Healthy fats called Omega-3 fatty acids are found in foods such as flaxseeds and coldwater fish, like sardines, salmon, and mackerel.  Limit how much you eat of the following: ? Canned or prepackaged foods. ? Food that is high in trans fat, such as fried foods. ? Food that is high in saturated fat, such as fatty meat. ? Sweets, desserts, sugary drinks, and other foods with added sugar. ? Full-fat dairy products.  Do not salt foods before eating.  Try to eat at least 2 vegetarian meals each week.  Eat more home-cooked food and less restaurant, buffet, and fast food.  When eating at a restaurant, ask that your food be prepared with less salt or no salt, if possible. What foods are recommended? The items listed may not be a complete list. Talk with your dietitian about what   dietary choices are best for you. Grains Whole-grain or whole-wheat bread. Whole-grain or whole-wheat pasta. Brown rice. Oatmeal. Quinoa. Bulgur. Whole-grain and low-sodium cereals. Pita bread. Low-fat, low-sodium crackers. Whole-wheat flour tortillas. Vegetables Fresh or frozen vegetables (raw, steamed, roasted, or grilled). Low-sodium or reduced-sodium tomato and vegetable juice. Low-sodium or reduced-sodium tomato sauce and tomato paste. Low-sodium or reduced-sodium canned vegetables. Fruits All fresh, dried, or frozen fruit. Canned fruit in natural juice (without  added sugar). Meat and other protein foods Skinless chicken or turkey. Ground chicken or turkey. Pork with fat trimmed off. Fish and seafood. Egg whites. Dried beans, peas, or lentils. Unsalted nuts, nut butters, and seeds. Unsalted canned beans. Lean cuts of beef with fat trimmed off. Low-sodium, lean deli meat. Dairy Low-fat (1%) or fat-free (skim) milk. Fat-free, low-fat, or reduced-fat cheeses. Nonfat, low-sodium ricotta or cottage cheese. Low-fat or nonfat yogurt. Low-fat, low-sodium cheese. Fats and oils Soft margarine without trans fats. Vegetable oil. Low-fat, reduced-fat, or light mayonnaise and salad dressings (reduced-sodium). Canola, safflower, olive, soybean, and sunflower oils. Avocado. Seasoning and other foods Herbs. Spices. Seasoning mixes without salt. Unsalted popcorn and pretzels. Fat-free sweets. What foods are not recommended? The items listed may not be a complete list. Talk with your dietitian about what dietary choices are best for you. Grains Baked goods made with fat, such as croissants, muffins, or some breads. Dry pasta or rice meal packs. Vegetables Creamed or fried vegetables. Vegetables in a cheese sauce. Regular canned vegetables (not low-sodium or reduced-sodium). Regular canned tomato sauce and paste (not low-sodium or reduced-sodium). Regular tomato and vegetable juice (not low-sodium or reduced-sodium). Pickles. Olives. Fruits Canned fruit in a light or heavy syrup. Fried fruit. Fruit in cream or butter sauce. Meat and other protein foods Fatty cuts of meat. Ribs. Fried meat. Bacon. Sausage. Bologna and other processed lunch meats. Salami. Fatback. Hotdogs. Bratwurst. Salted nuts and seeds. Canned beans with added salt. Canned or smoked fish. Whole eggs or egg yolks. Chicken or turkey with skin. Dairy Whole or 2% milk, cream, and half-and-half. Whole or full-fat cream cheese. Whole-fat or sweetened yogurt. Full-fat cheese. Nondairy creamers. Whipped toppings.  Processed cheese and cheese spreads. Fats and oils Butter. Stick margarine. Lard. Shortening. Ghee. Bacon fat. Tropical oils, such as coconut, palm kernel, or palm oil. Seasoning and other foods Salted popcorn and pretzels. Onion salt, garlic salt, seasoned salt, table salt, and sea salt. Worcestershire sauce. Tartar sauce. Barbecue sauce. Teriyaki sauce. Soy sauce, including reduced-sodium. Steak sauce. Canned and packaged gravies. Fish sauce. Oyster sauce. Cocktail sauce. Horseradish that you find on the shelf. Ketchup. Mustard. Meat flavorings and tenderizers. Bouillon cubes. Hot sauce and Tabasco sauce. Premade or packaged marinades. Premade or packaged taco seasonings. Relishes. Regular salad dressings. Where to find more information:  National Heart, Lung, and Blood Institute: www.nhlbi.nih.gov  American Heart Association: www.heart.org Summary  The DASH eating plan is a healthy eating plan that has been shown to reduce high blood pressure (hypertension). It may also reduce your risk for type 2 diabetes, heart disease, and stroke.  With the DASH eating plan, you should limit salt (sodium) intake to 2,300 mg a day. If you have hypertension, you may need to reduce your sodium intake to 1,500 mg a day.  When on the DASH eating plan, aim to eat more fresh fruits and vegetables, whole grains, lean proteins, low-fat dairy, and heart-healthy fats.  Work with your health care provider or diet and nutrition specialist (dietitian) to adjust your eating plan to your individual   calorie needs. This information is not intended to replace advice given to you by your health care provider. Make sure you discuss any questions you have with your health care provider. Document Released: 01/20/2011 Document Revised: 01/25/2016 Document Reviewed: 01/25/2016 Elsevier Interactive Patient Education  2017 Elsevier Inc.  

## 2016-07-06 ENCOUNTER — Encounter: Payer: Self-pay | Admitting: Family Medicine

## 2016-07-06 ENCOUNTER — Ambulatory Visit (INDEPENDENT_AMBULATORY_CARE_PROVIDER_SITE_OTHER): Payer: BLUE CROSS/BLUE SHIELD | Admitting: Family Medicine

## 2016-07-06 VITALS — BP 134/86 | Temp 98.6°F | Ht 65.5 in | Wt 162.0 lb

## 2016-07-06 DIAGNOSIS — I10 Essential (primary) hypertension: Secondary | ICD-10-CM

## 2016-07-06 DIAGNOSIS — D729 Disorder of white blood cells, unspecified: Secondary | ICD-10-CM

## 2016-07-06 DIAGNOSIS — Z0000 Encounter for general adult medical examination without abnormal findings: Secondary | ICD-10-CM | POA: Diagnosis not present

## 2016-07-06 DIAGNOSIS — D72818 Other decreased white blood cell count: Secondary | ICD-10-CM | POA: Insufficient documentation

## 2016-07-06 NOTE — Patient Instructions (Signed)
We will set you up a consult in hematology for further evaluation of your low blood count  Continue good health habits  Continue to stay away from salt  Check your blood pressure 3 times weekly in the morning. If he noticed an elevated blood pressure reading turn the machine off and check it again. If you get consistent elevated readings then check your blood pressure daily for 2 weeks. If after 2 weeks your blood pressure goes back to normal then go back to check it Monday Wednesday Friday. However if after 2 weeks of sampling daily your blood pressure is all elevated and return to see us for further evaluation.

## 2016-07-06 NOTE — Progress Notes (Signed)
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 She has a history of a VSD is followed by cardiology yearly. She is asymptomatic.  She gets routine eye care, dental care, and you mammography at the GYN office where she gets her [facility]. She's been on BCPs and now has a period every 3 months. Despite that her hemoglobin remains at 10. She's never had a hematologic evaluation. She says she's been taking over-the-counter supplements vitamins and iron but her blood count is still 10.  She was recently put on Norvasc by the cardiologist closer blood pressure was elevated. Father has hypertension. She came in here saw Dr. Caryl NeverBurchette. He stopped the Norvasc at her request and she's tried lifestyle changes. She exercises on a regular basis avoid salt BP now in the 120/70 range at home. 131/81 here.  She takes Zyrtec 10 mg plain for allergic rhinitis. With daily antihistamine she has no more asthma.  Vaccinations up-to-date  Family history as above  Social history as above  14 point review of systems otherwise negative  BP 134/86 (BP Location: Left Arm)   Temp 98.6 F (37 C) (Oral)   Ht 5' 5.5" (1.664 m)   Wt 162 lb (73.5 kg)   LMP 04/08/2016 (Within Days)   BMI 26.55 kg/m  Examination of the HEENT were negative neck was supple thyroid is not enlarged lungs are clear to auscultation cardiac exam shows a grade grade 5 to and fro murmur consistent with her VSD. Abdominal exam negative tremors normal skin normal peripheral pulses normal except for seborrheic keratosis on her right anterior chest wall numerous capillary hemangiomas and skin tags.  #1 healthy female  #2 VSD,,,,,,, followed by cardiology  #3 anemia,,,,,,,,, heme consult  Number for allergic rhinitis......... continue Zyrtec

## 2016-07-26 ENCOUNTER — Encounter: Payer: Self-pay | Admitting: Oncology

## 2016-07-26 ENCOUNTER — Telehealth: Payer: Self-pay

## 2016-07-26 ENCOUNTER — Inpatient Hospital Stay: Payer: BLUE CROSS/BLUE SHIELD | Attending: Oncology | Admitting: Oncology

## 2016-07-26 ENCOUNTER — Inpatient Hospital Stay: Payer: BLUE CROSS/BLUE SHIELD

## 2016-07-26 ENCOUNTER — Other Ambulatory Visit: Payer: Self-pay

## 2016-07-26 VITALS — BP 126/83 | HR 71 | Temp 97.7°F | Resp 18 | Ht 66.54 in | Wt 162.5 lb

## 2016-07-26 DIAGNOSIS — L509 Urticaria, unspecified: Secondary | ICD-10-CM | POA: Diagnosis not present

## 2016-07-26 DIAGNOSIS — L723 Sebaceous cyst: Secondary | ICD-10-CM

## 2016-07-26 DIAGNOSIS — J45909 Unspecified asthma, uncomplicated: Secondary | ICD-10-CM | POA: Diagnosis not present

## 2016-07-26 DIAGNOSIS — Q210 Ventricular septal defect: Secondary | ICD-10-CM | POA: Diagnosis not present

## 2016-07-26 DIAGNOSIS — D509 Iron deficiency anemia, unspecified: Secondary | ICD-10-CM

## 2016-07-26 DIAGNOSIS — I10 Essential (primary) hypertension: Secondary | ICD-10-CM

## 2016-07-26 LAB — CBC
HCT: 35.1 % (ref 35.0–47.0)
HEMOGLOBIN: 11.7 g/dL — AB (ref 12.0–16.0)
MCH: 26.7 pg (ref 26.0–34.0)
MCHC: 33.3 g/dL (ref 32.0–36.0)
MCV: 80.1 fL (ref 80.0–100.0)
Platelets: 270 10*3/uL (ref 150–440)
RBC: 4.39 MIL/uL (ref 3.80–5.20)
RDW: 18.8 % — ABNORMAL HIGH (ref 11.5–14.5)
WBC: 6.7 10*3/uL (ref 3.6–11.0)

## 2016-07-26 LAB — COMPREHENSIVE METABOLIC PANEL
ALK PHOS: 44 U/L (ref 38–126)
ALT: 15 U/L (ref 14–54)
AST: 20 U/L (ref 15–41)
Albumin: 3.9 g/dL (ref 3.5–5.0)
Anion gap: 8 (ref 5–15)
BUN: 8 mg/dL (ref 6–20)
CALCIUM: 9.2 mg/dL (ref 8.9–10.3)
CO2: 27 mmol/L (ref 22–32)
CREATININE: 0.88 mg/dL (ref 0.44–1.00)
Chloride: 103 mmol/L (ref 101–111)
Glucose, Bld: 93 mg/dL (ref 65–99)
Potassium: 3.8 mmol/L (ref 3.5–5.1)
Sodium: 138 mmol/L (ref 135–145)
Total Bilirubin: 0.3 mg/dL (ref 0.3–1.2)
Total Protein: 7.4 g/dL (ref 6.5–8.1)

## 2016-07-26 LAB — FOLATE: FOLATE: 18.8 ng/mL (ref >5.9)

## 2016-07-26 LAB — IRON AND TIBC
Iron: 64 ug/dL (ref 28–170)
Saturation Ratios: 14 % (ref 10.4–31.8)
TIBC: 466 ug/dL — ABNORMAL HIGH (ref 250–450)
UIBC: 402 ug/dL

## 2016-07-26 LAB — TSH: TSH: 1.903 u[IU]/mL (ref 0.350–4.500)

## 2016-07-26 LAB — RETICULOCYTES
RBC.: 4.39 MIL/uL (ref 3.80–5.20)
RETIC CT PCT: 1.1 % (ref 0.4–3.1)
Retic Count, Absolute: 48.3 10*3/uL (ref 19.0–183.0)

## 2016-07-26 LAB — VITAMIN B12: Vitamin B-12: 170 pg/mL — ABNORMAL LOW (ref 180–914)

## 2016-07-26 LAB — FERRITIN: FERRITIN: 5 ng/mL — AB (ref 11–307)

## 2016-07-26 NOTE — Progress Notes (Signed)
Labs consistent with iron deficiency. She needs to take po iron 325 mg BID. She needs to think about GI referral we spoke about and let me know

## 2016-07-26 NOTE — Telephone Encounter (Signed)
Call to pt per Dr Smith Robertao order. Informed pt to start taking 1) iron 325 mg BID ,2) also consider the GI consult that Dr Smith Robertao discussed w her. Contact Sherry V with her decision @ [phone]

## 2016-07-26 NOTE — Progress Notes (Signed)
Here as new pt evaluation 

## 2016-07-27 ENCOUNTER — Telehealth: Payer: Self-pay | Admitting: *Deleted

## 2016-07-27 LAB — HAPTOGLOBIN: HAPTOGLOBIN: 130 mg/dL (ref 34–200)

## 2016-07-27 NOTE — Telephone Encounter (Signed)
Called and left message of pt's cell phone that her b12 level was low and rec: by MD that she get monthly b12 injections and if she call me back and I left my number to discuss further.

## 2016-07-27 NOTE — Telephone Encounter (Signed)
-----   Message from Creig HinesArchana C Rao, MD sent at 07/27/2016  1:34 PM EDT ----- Her b12 is low. We can give her montly B12 injections if she is willing. Please let her know

## 2016-07-27 NOTE — Progress Notes (Signed)
Her b12 is low. We can give her montly B12 injections if she is willing. Please let her know

## 2016-07-28 ENCOUNTER — Encounter: Payer: Self-pay | Admitting: Oncology

## 2016-07-28 LAB — MULTIPLE MYELOMA PANEL, SERUM
ALPHA 1: 0.2 g/dL (ref 0.0–0.4)
ALPHA2 GLOB SERPL ELPH-MCNC: 0.7 g/dL (ref 0.4–1.0)
Albumin SerPl Elph-Mcnc: 3.4 g/dL (ref 2.9–4.4)
Albumin/Glob SerPl: 1.1 (ref 0.7–1.7)
B-Globulin SerPl Elph-Mcnc: 1.1 g/dL (ref 0.7–1.3)
Gamma Glob SerPl Elph-Mcnc: 1.3 g/dL (ref 0.4–1.8)
Globulin, Total: 3.4 g/dL (ref 2.2–3.9)
IGA: 232 mg/dL (ref 87–352)
IGG (IMMUNOGLOBIN G), SERUM: 1197 mg/dL (ref 700–1600)
IGM, SERUM: 162 mg/dL (ref 26–217)
TOTAL PROTEIN ELP: 6.8 g/dL (ref 6.0–8.5)

## 2016-07-28 NOTE — Progress Notes (Signed)
Hematology/Oncology Consult note [facility] Telephone:([phone] [phone]  Patient Care Team: Dorena Cookey, MD as PCP - General   Name of the patient: Elizabeth Lozano  [id number]  1971-01-13    Reason for referral- microcytic anemia   Referring physician- Dr. Stevie Kern  Date of visit: 07/28/16   History of presenting illness- patient is a 46 year old female with a past medical history significant for ventricular septal defect which has not required surgical management and rosacea. She has been referred to [place] for microcytic anemia. Last CBC from 04/28/2016 showed white count of 5.4, H&H of 10/31.4 with an MCV of 74.2 and platelet count of 305. Her prior H&H in 2016 and 2017 has been around 10 with an MCV between 78-81.5. Off note she was found to have a low ferritin of 3.7 in 2016. She has been on and off oral iron in the past. Most recently she has been on one iron tablet a day. She denies any blood in her stools or urine. Denies any frequent use of NSAIDs. She has not had any EGD or colonoscopy in the past.  ECOG PS- 0  Pain scale- 0   Review of systems- Review of Systems  Constitutional: Negative for chills, fever, malaise/fatigue and weight loss.  HENT: Negative for congestion, ear discharge and nosebleeds.   Eyes: Negative for blurred vision.  Respiratory: Negative for cough, hemoptysis, sputum production, shortness of breath and wheezing.   Cardiovascular: Negative for chest pain, palpitations, orthopnea and claudication.  Gastrointestinal: Negative for abdominal pain, blood in stool, constipation, diarrhea, heartburn, melena, nausea and vomiting.  Genitourinary: Negative for dysuria, flank pain, frequency, hematuria and urgency.  Musculoskeletal: Negative for back pain, joint pain and myalgias.  Skin: Negative for rash.  Neurological: Negative for dizziness, tingling, focal weakness, seizures, weakness and headaches.  Endo/Heme/Allergies:  Does not bruise/bleed easily.  Psychiatric/Behavioral: Negative for depression and suicidal ideas. The patient does not have insomnia.     Allergies  Allergen Reactions  . Erythromycin Nausea And Vomiting  . Iohexol Hives     Code: HIVES, Desc: RN AMY NOTICED HIVE ON STOMACH AFTER HEART SCAN. ALSO ONE ON BACK. NOT ITCHING UNTIL AFTER SHE KNEW OF HIVES. DR.NISHAN NOTIFIED. GIVEN BENADRYL., Onset Date: [id number]   . Tetracycline Hives    Patient Active Problem List   Diagnosis Date Noted  . 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 Hypertension 05/03/2016  . Anemia due to other cause 03/24/2014  . Routine general medical examination at a health care facility 03/24/2014  . VSD (ventricular septal defect) 03/09/2011  . URTICARIA 06/03/2008  . SEBACEOUS CYST 10/09/2007  . Asthma 10/31/2006     Past Medical History:  Diagnosis Date  . Allergy    she has seen Dr. Tiajuana Amass   . Anemia    long term issues per pt  . Asthma   . History of stress test    ETT-echo (9/14):  ST depression noted on ECGs; no wall motion abnormalities at peak exercise on echocardiogram  . VSD (ventricular septal defect)    Cardiac CTA 05/2008: Membranous VSD, 8-9 mm in diameter, mild RVE, EF 64%, calcium score 0, no CAD. Last echo 02/2011: EF 55-60%, mild CAD, perimembranous VSD slightly more prominent but no RVE or pulmonary hypertension;  Echo (9/14): Small perimembranous VSD-no significant change since 02/2011; EF 55-60%, moderate LAE     Past Surgical History:  Procedure Laterality Date  . cyst left wrist  Social History   Social History  . Marital status: Divorced    Spouse name: N/A  . Number of children: N/A  . Years of education: N/A   Occupational History  . Not on file.   Social History Main Topics  . Smoking status: Never Smoker  . Smokeless tobacco: Never Used  . Alcohol use 2.4 oz/week    4 Standard drinks or equivalent per week     Comment: one beer per day per pt  . Drug use:  No  . Sexual activity: No   Other Topics Concern  . Not on file   Social History Narrative  . No narrative on file     Family History  Problem Relation Age of Onset  . Sarcoidosis Mother   . Heart attack Father   . Hypertension Father   . Hyperlipidemia Father   . Melanoma Father   . Cancer Father   . Healthy Brother      Current Outpatient Prescriptions:  .  cetirizine (ZYRTEC) 10 MG tablet, Take 10 mg by mouth daily., Disp: , Rfl:  .  Levonorgestrel-Ethinyl Estradiol (AMETHIA,CAMRESE) 0.15-0.03 &0.01 MG tablet, Take 1 tablet by mouth daily., Disp: , Rfl:  .  MAGNESIUM PO, Take 200 mg by mouth daily. , Disp: , Rfl:  .  Multiple Vitamin (MULTIVITAMIN) tablet, Take 1 tablet by mouth daily., Disp: , Rfl:  .  levalbuterol (XOPENEX HFA) 45 MCG/ACT inhaler, Inhale 2 puffs into the lungs every 4 (four) hours as needed for wheezing. (Patient not taking: Reported on 07/26/2016), Disp: 1 Inhaler, Rfl: 2   Physical exam:  Vitals:   07/26/16 1340  BP: 126/83  Pulse: 71  Resp: 18  Temp: 97.7 F (36.5 C)  TempSrc: Tympanic  Weight: 162 lb 8 oz (73.7 kg)  Height: 5' 6.53" (1.69 m)   Physical Exam  Constitutional: She is oriented to person, place, and time and well-developed, well-nourished, and in no distress.  HENT:  Head: Normocephalic and atraumatic.  Skin over b/l cheeks and nose appears red due to rsacea  Eyes: EOM are normal. Pupils are equal, round, and reactive to light.  Neck: Normal range of motion.  Cardiovascular: Normal rate and regular rhythm.   Holosystolic murmur heard all over the precordium  Pulmonary/Chest: Effort normal and breath sounds normal.  Abdominal: Soft. Bowel sounds are normal.  Neurological: She is alert and oriented to person, place, and time.  Skin: Skin is warm and dry.       CMP Latest Ref Rng & Units 07/26/2016  Glucose 65 - 99 mg/dL 93  BUN 6 - 20 mg/dL 8  Creatinine 0.44 - 1.00 mg/dL 0.88  Sodium 135 - 145 mmol/L 138  Potassium 3.5  - 5.1 mmol/L 3.8  Chloride 101 - 111 mmol/L 103  CO2 22 - 32 mmol/L 27  Calcium 8.9 - 10.3 mg/dL 9.2  Total Protein 6.5 - 8.1 g/dL 7.4  Total Bilirubin 0.3 - 1.2 mg/dL 0.3  Alkaline Phos 38 - 126 U/L 44  AST 15 - 41 U/L 20  ALT 14 - 54 U/L 15   CBC Latest Ref Rng & Units 07/26/2016  WBC 3.6 - 11.0 K/uL 6.7  Hemoglobin 12.0 - 16.0 g/dL 11.7(L)  Hematocrit 35.0 - 47.0 % 35.1  Platelets 150 - 440 K/uL 270     Assessment and plan- Patient is a 46 y.o. female referred for microcytic anemia  Patient has had a chronic anemia with a hemoglobin of around 10 since 2016 with  borderline low MCV. Her most recent CBC also revealed microcytosis. This is likely secondary to iron deficiency. Today I will check CBC, CMP, ferritin and iron studies, B12 and folate, reticulocyte, haptoglobin, TSH and multiple myeloma panel. I will see her back in 3 weeks' time to discuss the results of her blood work and further management. If she as evidence of iron deficiency, I will discuss GI referral with next visit.    Thank you for this kind referral and the opportunity to participate in the care of this patient   Visit Diagnosis 1. Microcytic anemia     Dr. Randa Evens, MD, MPH [place] [facility] Pager- [phone] 07/28/2016

## 2016-08-04 ENCOUNTER — Telehealth: Payer: Self-pay | Admitting: *Deleted

## 2016-08-04 NOTE — Telephone Encounter (Signed)
-----   Message from Creig HinesArchana C Rao, MD sent at 07/27/2016  1:34 PM EDT ----- Her b12 is low. We can give her montly B12 injections if she is willing. Please let her know

## 2016-08-04 NOTE — Telephone Encounter (Signed)
Pt called back and let me know that she does not want b12 injections and she has looked up food that has high amounts of b12.  She does not eat most of these foods and she will start this weekend.  I spoke to Dr. Smith Robertao and she says that is fine but she should start otc vit. b12 1000mcg one a day and the pt will get it on the weekend and start it.  She wonders if when she comes in 7/3 should she have the level checked.  I spoke to Smith RobertRao and the date of 7/3 would not be enough time to make level up.  She will check it later. Patient agreeable to the above plan

## 2016-08-16 ENCOUNTER — Inpatient Hospital Stay: Payer: BLUE CROSS/BLUE SHIELD | Attending: Oncology | Admitting: Oncology

## 2016-08-16 ENCOUNTER — Inpatient Hospital Stay: Payer: BLUE CROSS/BLUE SHIELD

## 2016-08-16 VITALS — BP 115/77 | HR 62 | Temp 98.1°F | Resp 18 | Wt 164.2 lb

## 2016-08-16 DIAGNOSIS — D509 Iron deficiency anemia, unspecified: Secondary | ICD-10-CM | POA: Diagnosis not present

## 2016-08-16 DIAGNOSIS — Q210 Ventricular septal defect: Secondary | ICD-10-CM | POA: Diagnosis not present

## 2016-08-16 DIAGNOSIS — J45909 Unspecified asthma, uncomplicated: Secondary | ICD-10-CM | POA: Diagnosis not present

## 2016-08-16 DIAGNOSIS — Z79899 Other long term (current) drug therapy: Secondary | ICD-10-CM | POA: Insufficient documentation

## 2016-08-16 DIAGNOSIS — E538 Deficiency of other specified B group vitamins: Secondary | ICD-10-CM | POA: Diagnosis not present

## 2016-08-16 NOTE — Progress Notes (Signed)
Patient offers no complaints today.  Here for lab results.

## 2016-08-17 ENCOUNTER — Encounter: Payer: Self-pay | Admitting: Oncology

## 2016-08-17 NOTE — Progress Notes (Signed)
Hematology/Oncology Consult note [facility]  [phone] [phone]  Patient Care Team: Roderick Pee, MD as PCP - General   Name of the patient: Elizabeth Lozano  [id number]  1970-04-12   Date of visit: 08/17/16  Diagnosis- 1. Microcytic anemia secondary to iron deficiency 2. B12 deficiency  Chief complaint/ Reason for visit- discuss results of bloodwork  Heme/Onc history:  patient is a 46 year old female with a past medical history significant for ventricular septal defect which has not required surgical management and rosacea. She has been referred to [place] for microcytic anemia. Last CBC from 04/28/2016 showed white count of 5.4, H&H of 10/31.4 with an MCV of 74.2 and platelet count of 305. Her prior H&H in 2016 and 2017 has been around 10 with an MCV between 78-81.5. Off note she was found to have a low ferritin of 3.7 in 2016. She has been on and off oral iron in the past. Most recently she has been on one iron tablet a day. She denies any blood in her stools or urine. Denies any frequent use of NSAIDs. She has not had any EGD or colonoscopy in the past.  Results of bloodwork from 07/26/16 were as follows: cbc showed wbc of 6.7, H/H 11.7/35.1 and paltelet count of 270. CMP, haptoglobin, TSH WNL. Myeloma panel showed no monoclonal protein. Ferritin was low at 5. Iron stauration low at 14 and TIBC elevated. Folate normal. B12 low at 170  Interval history- Patient was informed abouyt low iron and B12 and started taking PO iron BID and daily B12 which she is tolerating well without any side effects. She feels her energy levels are much improved since then. She has also changed her diet. Reports that her periods are not partcularly heavy. She has not seen GI and has never had endoscopy. Denies any routine NSAID use or blood in urine or stool  ECOG PS- 0 Pain scale- 0   Review of systems- Review of Systems  Constitutional: Negative for chills, fever,  malaise/fatigue and weight loss.  HENT: Negative for congestion, ear discharge and nosebleeds.   Eyes: Negative for blurred vision.  Respiratory: Negative for cough, hemoptysis, sputum production, shortness of breath and wheezing.   Cardiovascular: Negative for chest pain, palpitations, orthopnea and claudication.  Gastrointestinal: Negative for abdominal pain, blood in stool, constipation, diarrhea, heartburn, melena, nausea and vomiting.  Genitourinary: Negative for dysuria, flank pain, frequency, hematuria and urgency.  Musculoskeletal: Negative for back pain, joint pain and myalgias.  Skin: Negative for rash.  Neurological: Negative for dizziness, tingling, focal weakness, seizures, weakness and headaches.  Endo/Heme/Allergies: Does not bruise/bleed easily.  Psychiatric/Behavioral: Negative for depression and suicidal ideas. The patient does not have insomnia.        Allergies  Allergen Reactions  . Erythromycin Nausea And Vomiting  . Iohexol Hives     Code: HIVES, Desc: RN AMY NOTICED HIVE ON STOMACH AFTER HEART SCAN. ALSO ONE ON BACK. NOT ITCHING UNTIL AFTER SHE KNEW OF HIVES. DR.NISHAN NOTIFIED. GIVEN BENADRYL., Onset Date: [id number]   . Tetracycline Hives     Past Medical History:  Diagnosis Date  . Allergy    she has seen Dr. Eileen Stanford   . Anemia    long term issues per pt  . Asthma   . History of stress test    ETT-echo (9/14):  ST depression noted on ECGs; no wall motion abnormalities at peak exercise on echocardiogram  . VSD (ventricular septal defect)  Cardiac CTA 05/2008: Membranous VSD, 8-9 mm in diameter, mild RVE, EF 64%, calcium score 0, no CAD. Last echo 02/2011: EF 55-60%, mild CAD, perimembranous VSD slightly more prominent but no RVE or pulmonary hypertension;  Echo (9/14): Small perimembranous VSD-no significant change since 02/2011; EF 55-60%, moderate LAE     Past Surgical History:  Procedure Laterality Date  . cyst left wrist      Social History     Social History  . Marital status: Divorced    Spouse name: N/A  . Number of children: N/A  . Years of education: N/A   Occupational History  . Not on file.   Social History Main Topics  . Smoking status: Never Smoker  . Smokeless tobacco: Never Used  . Alcohol use 2.4 oz/week    4 Standard drinks or equivalent per week     Comment: one beer per day per pt  . Drug use: No  . Sexual activity: No   Other Topics Concern  . Not on file   Social History Narrative  . No narrative on file    Family History  Problem Relation Age of Onset  . Sarcoidosis Mother   . Heart attack Father   . Hypertension Father   . Hyperlipidemia Father   . Melanoma Father   . Cancer Father   . Healthy Brother      Current Outpatient Prescriptions:  .  cetirizine (ZYRTEC) 10 MG tablet, Take 10 mg by mouth daily., Disp: , Rfl:  .  cyanocobalamin 1000 MCG tablet, Take 1,000 mcg by mouth daily., Disp: , Rfl:  .  levalbuterol (XOPENEX HFA) 45 MCG/ACT inhaler, Inhale 2 puffs into the lungs every 4 (four) hours as needed for wheezing., Disp: 1 Inhaler, Rfl: 2 .  Levonorgestrel-Ethinyl Estradiol (AMETHIA,CAMRESE) 0.15-0.03 &0.01 MG tablet, Take 1 tablet by mouth daily., Disp: , Rfl:  .  MAGNESIUM PO, Take 200 mg by mouth daily. , Disp: , Rfl:  .  Multiple Vitamin (MULTIVITAMIN) tablet, Take 1 tablet by mouth daily., Disp: , Rfl:   Physical exam:  Vitals:   08/16/16 1542  BP: 115/77  Pulse: 62  Resp: 18  Temp: 98.1 F (36.7 C)  TempSrc: Tympanic  Weight: 164 lb 3 oz (74.5 kg)   Physical Exam  Constitutional: She is oriented to person, place, and time and well-developed, well-nourished, and in no distress.  HENT:  Head: Normocephalic and atraumatic.  Features of acne rosacea  Eyes: EOM are normal. Pupils are equal, round, and reactive to light.  Neck: Normal range of motion.  Cardiovascular: Normal rate and regular rhythm.   Murmur (holosystolic murmur heard throughout the precordium)  heard. Pulmonary/Chest: Effort normal and breath sounds normal.  Abdominal: Soft. Bowel sounds are normal.  Neurological: She is alert and oriented to person, place, and time.  Skin: Skin is warm and dry.     CMP Latest Ref Rng & Units 07/26/2016  Glucose 65 - 99 mg/dL 93  BUN 6 - 20 mg/dL 8  Creatinine 1.610.44 - 0.961.00 mg/dL 0.450.88  Sodium 409135 - 811145 mmol/L 138  Potassium 3.5 - 5.1 mmol/L 3.8  Chloride 101 - 111 mmol/L 103  CO2 22 - 32 mmol/L 27  Calcium 8.9 - 10.3 mg/dL 9.2  Total Protein 6.5 - 8.1 g/dL 7.4  Total Bilirubin 0.3 - 1.2 mg/dL 0.3  Alkaline Phos 38 - 126 U/L 44  AST 15 - 41 U/L 20  ALT 14 - 54 U/L 15   CBC Latest Ref  Rng & Units 07/26/2016  WBC 3.6 - 11.0 K/uL 6.7  Hemoglobin 12.0 - 16.0 g/dL 11.7(L)  Hematocrit 35.0 - 47.0 % 35.1  Platelets 150 - 440 K/uL 270      Assessment and plan- Patient is a 46 y.o. female who sees me for:  1. Iron deficiency anemia- she has mild microcytic anemia from iron deficiency and will continue PO iron BID. Will check stool H pylori ag and UA today. Check cbc, iron studies and celiac panel in 3 months and she will see me in 6 months. Her iron deficiency anemia cannot be attributed to heavy menses. She seems to absorbing her PO iron as her anemia is improving. I will therefore refer her GI for further evaluation  2. B12 deficiency- no signs of neurological side efefcts or peripheral neuropathy. She wishes to avoid B12 shots if possible. She is to continue daily B12 and we will repeat in 3 months time   Visit Diagnosis 1. B12 deficiency   2. Iron deficiency anemia, unspecified iron deficiency anemia type      Dr. Owens Shark, MD, MPH [facility] Pager- [phone] 08/17/2016 6:56 PM

## 2016-08-31 ENCOUNTER — Encounter: Payer: Self-pay | Admitting: Gastroenterology

## 2016-11-04 ENCOUNTER — Encounter: Payer: Self-pay | Admitting: Family Medicine

## 2016-11-15 ENCOUNTER — Inpatient Hospital Stay: Payer: BLUE CROSS/BLUE SHIELD

## 2017-01-11 ENCOUNTER — Telehealth: Payer: Self-pay | Admitting: Cardiovascular Disease

## 2017-01-11 NOTE — Telephone Encounter (Signed)
° °  [facility] Pre-operative Risk Assessment    Request for surgical clearance:  1. What type of surgery is being performed? Rootchanal   2. When is this surgery scheduled? No yet   3. Are there any medications that need to be held prior to surgery and how long? Pt would like to know if need antibiotic  4. Practice name and name of physician performing surgery? [facility]   5. What is your office phone and fax number? [phone] FAX [phone]  6. Anesthesia type (None, local, MAC, general) ? LOCAL   Simeon Craft V 03/00/9233, 2:41 PM  _________________________________________________________________   (provider comments below)

## 2017-01-12 NOTE — Telephone Encounter (Signed)
F/u Message  Isabelle CourseLydia call to f/u on clearance. She states if possible to get a call back to day or tomorrow. Please call back to discuss

## 2017-01-13 MED ORDER — AMOXICILLIN 500 MG PO CAPS: ORAL_CAPSULE | ORAL | 0 refills | Status: DC

## 2017-01-13 NOTE — Telephone Encounter (Signed)
    Chart reviewed as part of pre-operative protocol coverage.  D/w Dr. Eden EmmsNishan who does in fact prefer antibiotic for SBE prophylaxis given this patient's history of VSD.  I sent in amoxicillin 2g 1 hour prior to procedure to her pharmacy. Will route to callback staff - please call to let patient/requesting office know.  Laurann Montanaayna N Dunn, PA-C  01/13/2017, 8:53 AM

## 2017-01-13 NOTE — Telephone Encounter (Signed)
Reached out to pt to let her know that we do prefer her to have a ABX before her upcoming procedure. Left a message for pt to call back.  Reached out to Dr. Orvan FalconerBeavers & Halina AndreasKeating, their office is closed as well. Will fax this preop clearance as preop protocol, to # provided.

## 2017-01-17 ENCOUNTER — Telehealth: Payer: Self-pay | Admitting: Cardiovascular Disease

## 2017-01-17 NOTE — Telephone Encounter (Signed)
Faxed 11/28 phone note via Epic to fax provided.

## 2017-01-17 NOTE — Telephone Encounter (Signed)
New message      Fax [phone]   Needs a faxed copy of the surgical clearance sent to them as well as Dr Halina AndreasKeating office

## 2017-02-16 ENCOUNTER — Inpatient Hospital Stay: Payer: BLUE CROSS/BLUE SHIELD

## 2017-02-16 ENCOUNTER — Inpatient Hospital Stay: Payer: BLUE CROSS/BLUE SHIELD | Admitting: Oncology

## 2017-04-05 ENCOUNTER — Telehealth: Payer: Self-pay

## 2017-04-05 DIAGNOSIS — Q231 Congenital insufficiency of aortic valve: Secondary | ICD-10-CM

## 2017-04-05 DIAGNOSIS — Q2381 Bicuspid aortic valve: Secondary | ICD-10-CM

## 2017-04-05 DIAGNOSIS — Q210 Ventricular septal defect: Secondary | ICD-10-CM

## 2017-04-05 NOTE — Telephone Encounter (Signed)
Called patient to schedule an appointment for office visit and an echocardiogram. Patient stated she does not need to come in right now. Patient stated she just saw Dr. Eden EmmsNishan last year, and she thinks she if fine seeing him in a few years. Patient stated she would call and make the appointment when needed.

## 2017-04-05 NOTE — Telephone Encounter (Signed)
-----   Message from Ethelda ChickPamela Pate Ingalls, RN sent at 05/03/2016  5:16 PM EDT ----- Needs echo March 2019

## 2017-04-19 ENCOUNTER — Encounter (HOSPITAL_COMMUNITY): Payer: Self-pay | Admitting: Radiology

## 2017-04-19 NOTE — Telephone Encounter (Signed)
Wendall StadeNishan, Peter C, MD  Oren Bracketouglas, Edwina V  Cc: Ethelda ChickPate Ingalls, Janalee Grobe, RN        Ok to wait another year for echo

## 2017-10-18 ENCOUNTER — Encounter: Payer: BLUE CROSS/BLUE SHIELD | Admitting: Family Medicine

## 2018-03-29 DIAGNOSIS — Z01419 Encounter for gynecological examination (general) (routine) without abnormal findings: Secondary | ICD-10-CM | POA: Diagnosis not present

## 2018-03-29 DIAGNOSIS — Z1231 Encounter for screening mammogram for malignant neoplasm of breast: Secondary | ICD-10-CM | POA: Diagnosis not present

## 2018-03-29 DIAGNOSIS — Z6825 Body mass index (BMI) 25.0-25.9, adult: Secondary | ICD-10-CM | POA: Diagnosis not present

## 2018-04-02 ENCOUNTER — Other Ambulatory Visit: Payer: Self-pay | Admitting: Obstetrics and Gynecology

## 2018-04-02 DIAGNOSIS — R928 Other abnormal and inconclusive findings on diagnostic imaging of breast: Secondary | ICD-10-CM

## 2018-04-11 ENCOUNTER — Ambulatory Visit
Admission: RE | Admit: 2018-04-11 | Discharge: 2018-04-11 | Disposition: A | Discharge status: Home / Self Care | Payer: BLUE CROSS/BLUE SHIELD | Source: Ambulatory Visit | Attending: Obstetrics and Gynecology | Admitting: Obstetrics and Gynecology

## 2018-04-11 DIAGNOSIS — R928 Other abnormal and inconclusive findings on diagnostic imaging of breast: Secondary | ICD-10-CM

## 2018-04-11 DIAGNOSIS — N6001 Solitary cyst of right breast: Secondary | ICD-10-CM | POA: Diagnosis not present

## 2018-06-29 ENCOUNTER — Telehealth: Payer: Self-pay | Admitting: Cardiovascular Disease

## 2018-06-29 DIAGNOSIS — R002 Palpitations: Secondary | ICD-10-CM

## 2018-06-29 NOTE — Telephone Encounter (Signed)
Called patient back about her palpitations. Patient stated she use to have palpitations all the time and they would last couple of hours up to possibly a day. Patient stated they have never last for 2 days. Patient stated her HR is around upper 80's and it is usually in the 70's. Patient stated she stop drinking alcohol about 6 weeks ago. Now she is stopping caffeine due to the palpitations. Patient stated it goes any where between being intense to mild, but it has never gone on this long. BP has been stable at 117/82 and 120/85. Patient stated she has not made any recent changes in her life style. Patient did however state that she was playing a video game that scared her, but she does not think this could be the cause. Patient denies any CP, SOB, or any other symptoms. Patient stated she has normal stressors due to the pandemic, but she stated she is fine financially and mentally. Will forward to Dr. Eden Emms for further advisement.

## 2018-06-29 NOTE — Telephone Encounter (Signed)
New Message            Patient is calling today to get advise, she has being having heart palpatations for about 48 hours, patient would like to know what to do.

## 2018-06-30 NOTE — Telephone Encounter (Signed)
Does not sound serious can get 2 week Zio Patch

## 2018-07-02 ENCOUNTER — Telehealth: Payer: Self-pay | Admitting: *Deleted

## 2018-07-02 NOTE — Telephone Encounter (Signed)
14 say ZIO XT long term holter monitor to be mailed to home.  Instructions for application and use included in monitor kit.

## 2018-07-02 NOTE — Telephone Encounter (Signed)
Called patient back to give her Dr. Eden Emms recommendations. Patient stated now she is having symptoms when she does activities such as walking the dog, she states she gets a little SOB and feels dizzy at times. Patient stated as long as she is sitting and lying down she feels fine, but when she gets up is when she feels palpitations. Informed patient that her message would be sent to Dr. Eden Emms for advisement and in the mean time zio patch will be ordered and mailed to her for her to wear for 2 weeks.

## 2018-07-02 NOTE — Telephone Encounter (Signed)
Can see after Zio patch results available

## 2018-07-05 NOTE — Telephone Encounter (Signed)
Virtual Visit Pre-Appointment Phone Call  "(Name), I am calling you today to discuss your upcoming appointment. We are currently trying to limit exposure to the virus that causes COVID-19 by seeing patients at home rather than in the office."  1. "What is the BEST phone number to call the day of the visit?" - include this in appointment notes  2. "Do you have or have access to (through a family member/friend) a smartphone with video capability that we can use for your visit?" a. If yes - list this number in appt notes as "cell" (if different from BEST phone #) and list the appointment type as a VIDEO visit in appointment notes b. If no - list the appointment type as a PHONE visit in appointment notes  3. Confirm consent - "In the setting of the current Covid19 crisis, you are scheduled for a (phone or video) visit with your provider on (date) at (time).  Just as we do with many in-office visits, in order for you to participate in this visit, we must obtain consent.  If you'd like, I can send this to your mychart (if signed up) or email for you to review.  Otherwise, I can obtain your verbal consent now.  All virtual visits are billed to your insurance company just like a normal visit would be.  By agreeing to a virtual visit, we'd like you to understand that the technology does not allow for your provider to perform an examination, and thus may limit your provider's ability to fully assess your condition. If your provider identifies any concerns that need to be evaluated in person, we will make arrangements to do so.  Finally, though the technology is pretty good, we cannot assure that it will always work on either your or our end, and in the setting of a video visit, we may have to convert it to a phone-only visit.  In either situation, we cannot ensure that we have a secure connection.  Are you willing to proceed? YES  4. Advise patient to be prepared - "Two hours prior to your appointment, go  ahead and check your blood pressure, pulse, oxygen saturation, and your weight (if you have the equipment to check those) and write them all down. When your visit starts, your provider will ask you for this information. If you have an Apple Watch or Kardia device, please plan to have heart rate information ready on the day of your appointment. Please have a pen and paper handy nearby the day of the visit as well."  5. Give patient instructions for MyChart download to smartphone OR [email or web address] as below if video visit (depending on what platform provider is using)  6. Inform patient they will receive a phone call 15 minutes prior to their appointment time (may be from unknown caller ID) so they should be prepared to answer    TELEPHONE CALL NOTE  Elizabeth Lozano has been deemed a candidate for a follow-up tele-health visit to limit community exposure during the Covid-19 pandemic. I spoke with the patient via phone to ensure availability of phone/video source, confirm preferred email & phone number, and discuss instructions and expectations.  I reminded Elizabeth Lozano to be prepared with any vital sign and/or heart rhythm information that could potentially be obtained via home monitoring, at the time of her visit. I reminded Elizabeth Lozano to expect a phone call prior to her visit.  Elizabeth Chick, RN 07/05/2018 6:08 PM   IF USING DOXIMITY or  [email or web address].ME - The patient will receive a link just prior to their visit by text.     FULL LENGTH CONSENT FOR TELE-HEALTH VISIT   I hereby voluntarily request, consent and authorize [facility] and its employed or contracted physicians, physician assistants, nurse practitioners or other licensed health care professionals (the Practitioner), to provide me with telemedicine health care services (the "Services") as deemed necessary by the treating Practitioner. I acknowledge and consent to receive the Services by the Practitioner via telemedicine. I understand that the  telemedicine visit will involve communicating with the Practitioner through live audiovisual communication technology and the disclosure of certain medical information by electronic transmission. I acknowledge that I have been given the opportunity to request an in-person assessment or other available alternative prior to the telemedicine visit and am voluntarily participating in the telemedicine visit.  I understand that I have the right to withhold or withdraw my consent to the use of telemedicine in the course of my care at any time, without affecting my right to future care or treatment, and that the Practitioner or I may terminate the telemedicine visit at any time. I understand that I have the right to inspect all information obtained and/or recorded in the course of the telemedicine visit and may receive copies of available information for a reasonable fee.  I understand that some of the potential risks of receiving the Services via telemedicine include:  Marland Kitchen. Delay or interruption in medical evaluation due to technological equipment failure or disruption; . Information transmitted may not be sufficient (e.g. poor resolution of images) to allow for appropriate medical decision making by the Practitioner; and/or  . In rare instances, security protocols could fail, causing a breach of personal health information.  Furthermore, I acknowledge that it is my responsibility to provide information about my medical history, conditions and care that is complete and accurate to the best of my ability. I acknowledge that Practitioner's advice, recommendations, and/or decision may be based on factors not within their control, such as incomplete or inaccurate data provided by me or distortions of diagnostic images or specimens that may result from electronic transmissions. I understand that the practice of medicine is not an exact science and that Practitioner makes no warranties or guarantees regarding treatment  outcomes. I acknowledge that I will receive a copy of this consent concurrently upon execution via email to the email address I last provided but may also request a printed copy by calling the office of [facility].    I understand that my insurance will be billed for this visit.   I have read or had this consent read to me. . I understand the contents of this consent, which adequately explains the benefits and risks of the Services being provided via telemedicine.  . I have been provided ample opportunity to ask questions regarding this consent and the Services and have had my questions answered to my satisfaction. . I give my informed consent for the services to be provided through the use of telemedicine in my medical care  By participating in this telemedicine visit I agree to the above.

## 2018-07-06 ENCOUNTER — Ambulatory Visit (INDEPENDENT_AMBULATORY_CARE_PROVIDER_SITE_OTHER): Payer: BLUE CROSS/BLUE SHIELD

## 2018-07-06 DIAGNOSIS — R002 Palpitations: Secondary | ICD-10-CM | POA: Diagnosis not present

## 2018-07-31 ENCOUNTER — Other Ambulatory Visit: Payer: Self-pay

## 2018-08-13 NOTE — Progress Notes (Signed)
Virtual Visit via Video Note   This visit type was conducted due to national recommendations for restrictions regarding the COVID-19 Pandemic (e.g. social distancing) in an effort to limit this patient's exposure and mitigate transmission in our community.  Due to her co-morbid illnesses, this patient is at least at moderate risk for complications without adequate follow up.  This format is felt to be most appropriate for this patient at this time.  All issues noted in this document were discussed and addressed.  A limited physical exam was performed with this format.  Please refer to the patient's chart for her consent to telehealth for [facility].   Date:  08/15/2018   ID:  Elizabeth Lozano, DOB Jan 17, 1971, MRN [id number]  Patient Location: Home Provider Location: Office  PCP:  Dorena Cookey, MD (Inactive)  Cardiologist:   Johnsie Cancel Electrophysiologist:  None   Evaluation Performed:  Follow-Up Visit  Chief Complaint:  Palpitations   History of Present Illness:    48 y.o. first seen 04/15/16 small peri membranous VSD Lots of job stress at [employer] Echo reviewed from March 2018 EF 65-70% restrictive small PM VSD bicuspid AV with no AR/AS normal aortic root. 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    Past Medical History:  Diagnosis Date  . Allergy    she has seen Dr. Tiajuana Amass   . Anemia    long term issues per pt  . Asthma   . History of stress test    ETT-echo (9/14):  ST depression noted on ECGs; no wall motion abnormalities at peak exercise on echocardiogram  . VSD (ventricular  septal defect)    Cardiac CTA 05/2008: Membranous VSD, 8-9 mm in diameter, mild RVE, EF 64%, calcium score 0, no CAD. Last echo 02/2011: EF 55-60%, mild CAD, perimembranous VSD slightly more prominent but no RVE or pulmonary hypertension;  Echo (9/14): Small perimembranous VSD-no significant change since 02/2011; EF 55-60%, moderate LAE   Past Surgical History:  Procedure Laterality Date  . cyst left wrist       Current Meds  Medication Sig  . amoxicillin (AMOXIL) 500 MG capsule Take 4 capsules (2,000mg ) by mouth 1 hour prior to dental procedure.  . cetirizine (ZYRTEC) 10 MG tablet Take 10 mg by mouth daily.  . cyanocobalamin 1000 MCG tablet Take 1,000 mcg by mouth daily.  Marland Kitchen levalbuterol (XOPENEX HFA) 45 MCG/ACT inhaler Inhale 2 puffs into the lungs every 4 (four) hours as needed for wheezing.  Marland Kitchen MAGNESIUM PO Take 200 mg by mouth daily.   . Multiple Vitamin (MULTIVITAMIN) tablet Take 1 tablet by mouth daily.  . norethindrone (MICRONOR) 0.35 MG tablet Take 1 tablet by mouth daily.     Allergies:   Erythromycin, Iohexol, and Tetracycline   Social History   Tobacco Use  . Smoking status: Never Smoker  . Smokeless tobacco: Never Used  Substance Use Topics  . Alcohol use: Yes    Alcohol/week: 4.0 standard drinks    Types: 4 Standard drinks or equivalent per week    Comment: one beer per day per pt  .  Drug use: No     Family Hx: The patient's family history includes Cancer in her father; Healthy in her brother; Heart attack in her father; Hyperlipidemia in her father; Hypertension in her father; Melanoma in her father; Sarcoidosis in her mother.  ROS:   Please see the history of present illness.     All other systems reviewed and are negative.   Prior CV studies:   The following studies were reviewed today:  Echo 04/29/16 Monitor June 2020  Labs/Other Tests and Data Reviewed:    EKG:  Not done this visit   Recent Labs: No results found for requested labs within last 8760  hours.   Recent Lipid Panel Lab Results  Component Value Date/Time   CHOL 184 04/28/2016 08:49 AM   TRIG 93.0 04/28/2016 08:49 AM   HDL 51.70 04/28/2016 08:49 AM   CHOLHDL 4 04/28/2016 08:49 AM   LDLCALC 114 (H) 04/28/2016 08:49 AM    Wt Readings from Last 3 Encounters:  08/15/18 151 lb (68.5 kg)  08/16/16 164 lb 3 oz (74.5 kg)  07/26/16 162 lb 8 oz (73.7 kg)     Objective:    Vital Signs:  BP 119/84   Pulse 70   Ht 5\' 6"  (1.676 m)   Wt 151 lb (68.5 kg)   BMI 24.37 kg/m    Skin warm and dry No distress No tachypnea No JVP elevation  Neuro appears non focal No edema   ASSESSMENT & PLAN:    1.  VSD/Murmur f/u echo assess RV/LV function assess pulmonary pressures  2.  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 Vaginal bleeding:  Seen in ER 12/03/15 f/u Dr Vickey SagesAtkins Rx with BCP's 4. HTN related to job stress improved Prescribed norvasc but does not appear that it was filled improved  5. Bicuspid AV:  No AS/AR normal root f/u echo see #1 6. Palpitations benign monitor no beta blocker due to asthma observe   COVID-19 Education: The signs and symptoms of COVID-19 were discussed with the patient and how to seek care for testing (follow up with PCP or arrange E-visit).  The importance of social distancing was discussed today.  Time:   Today, I have spent 30 minutes with the patient with telehealth technology discussing the above problems.     Medication Adjustments/Labs and Tests Ordered: Current medicines are reviewed at length with the patient today.  Concerns regarding medicines are outlined above.   Tests Ordered:  Echo for VSD/Bicuspid AV   Medication Changes:  None   Disposition:  Follow up in a year if echo ok   Signed, Charlton HawsPeter Kaito Schulenburg, MD  08/15/2018 8:53 AM    [facility]

## 2018-08-14 ENCOUNTER — Telehealth: Payer: Self-pay | Admitting: Cardiovascular Disease

## 2018-08-14 NOTE — Telephone Encounter (Signed)

## 2018-08-15 ENCOUNTER — Telehealth (INDEPENDENT_AMBULATORY_CARE_PROVIDER_SITE_OTHER): Payer: BC Managed Care – PPO | Admitting: Cardiovascular Disease

## 2018-08-15 ENCOUNTER — Other Ambulatory Visit: Payer: Self-pay

## 2018-08-15 ENCOUNTER — Encounter: Payer: Self-pay | Admitting: Cardiovascular Disease

## 2018-08-15 VITALS — BP 119/84 | HR 70 | Ht 66.0 in | Wt 151.0 lb

## 2018-08-15 DIAGNOSIS — Q210 Ventricular septal defect: Secondary | ICD-10-CM | POA: Diagnosis not present

## 2018-08-15 DIAGNOSIS — Q2542 Hypoplasia of aorta: Secondary | ICD-10-CM | POA: Diagnosis not present

## 2018-08-15 NOTE — Patient Instructions (Signed)
Medication Instructions:  Your physician recommends that you continue on your current medications as directed. Please refer to the Current Medication list given to you today.  If you need a refill on your cardiac medications before your next appointment, please call your pharmacy.   Lab work: None Ordered   Testing/Procedures: None Ordered   Follow-Up: At [facility], you and your health needs are our priority.  As part of our continuing mission to provide you with exceptional heart care, we have created designated Provider Care Teams.  These Care Teams include your primary Cardiologist (physician) and Advanced Practice Providers (APPs -  Physician Assistants and Nurse Practitioners) who all work together to provide you with the care you need, when you need it. You will need a follow up appointment in 1 years.  Please call our office 2 months in advance to schedule this appointment.  You may see Dr. Nishan or one of the following Advanced Practice Providers on your designated Care Team:   Lori Gerhardt, NP Laura Ingold, NP . Jill McDaniel, NP   

## 2018-11-22 DIAGNOSIS — Z23 Encounter for immunization: Secondary | ICD-10-CM | POA: Diagnosis not present

## 2019-05-22 ENCOUNTER — Encounter: Payer: Self-pay | Admitting: Internal Medicine

## 2019-05-22 ENCOUNTER — Ambulatory Visit (INDEPENDENT_AMBULATORY_CARE_PROVIDER_SITE_OTHER): Payer: BC Managed Care – PPO | Admitting: Internal Medicine

## 2019-05-22 ENCOUNTER — Other Ambulatory Visit: Payer: Self-pay

## 2019-05-22 ENCOUNTER — Encounter: Payer: Self-pay | Admitting: Gastroenterology

## 2019-05-22 VITALS — BP 120/84 | HR 66 | Temp 97.8°F | Ht 66.0 in | Wt 146.6 lb

## 2019-05-22 DIAGNOSIS — I10 Essential (primary) hypertension: Secondary | ICD-10-CM | POA: Diagnosis not present

## 2019-05-22 DIAGNOSIS — M545 Low back pain: Secondary | ICD-10-CM

## 2019-05-22 DIAGNOSIS — J4520 Mild intermittent asthma, uncomplicated: Secondary | ICD-10-CM

## 2019-05-22 DIAGNOSIS — Z1211 Encounter for screening for malignant neoplasm of colon: Secondary | ICD-10-CM

## 2019-05-22 DIAGNOSIS — G8929 Other chronic pain: Secondary | ICD-10-CM

## 2019-05-22 NOTE — Patient Instructions (Signed)
-  Nice seeing you today!!  -Will arrange for outpatient physical therapy for your back issues.  -Let me know when you are ready for your sports medicine referral.  -Schedule follow up at your convenience for your physical. Please come in fasting that day.

## 2019-05-22 NOTE — Progress Notes (Signed)
Established Patient Office Visit     This visit occurred during the SARS-CoV-2 public health emergency.  Safety protocols were in place, including screening questions prior to the visit, additional usage of staff PPE, and extensive cleaning of exam room while observing appropriate contact time as indicated for disinfecting solutions.    CC/Reason for Visit: Establish care, discuss back pain  HPI: Elizabeth Lozano is a 49 y.o. female who is coming in today for the above mentioned reasons. Past Medical History is significant for: Well-controlled asthma on as needed rescue inhaler, seasonal allergies on antihistamines and a congenital ventricular septal defect that has not been repaired but does not cause her any issues.  She has scoliosis and because of this has had multiple episodes of back and hip pain throughout her life.  Most recent episode began beginning of February.  She has been having issues with her back, hip, thighs ever since.  She tried physical therapy for this in the past with significant relief and is requesting outpatient PT today.  She does not have radiculopathy, saddle anesthesia, bowel or bladder incontinence, fever.  She is not requesting any medications.  She has been icing.  She does not smoke, drinks alcohol only occasionally, will be having mammogram in 2 weeks when she sees her GYN.  She had her first Covid vaccine.   Past Medical/Surgical History: Past Medical History:  Diagnosis Date  . Allergy    she has seen Dr. Tiajuana Amass   . Anemia    long term issues per pt  . Asthma   . History of stress test    ETT-echo (9/14):  ST depression noted on ECGs; no wall motion abnormalities at peak exercise on echocardiogram  . VSD (ventricular septal defect)    Cardiac CTA 05/2008: Membranous VSD, 8-9 mm in diameter, mild RVE, EF 64%, calcium score 0, no CAD. Last echo 02/2011: EF 55-60%, mild CAD, perimembranous VSD slightly more prominent but no RVE or pulmonary hypertension;   Echo (9/14): Small perimembranous VSD-no significant change since 02/2011; EF 55-60%, moderate LAE    Past Surgical History:  Procedure Laterality Date  . cyst left wrist      Social History:  reports that she has never smoked. She has never used smokeless tobacco. She reports current alcohol use of about 4.0 standard drinks of alcohol per week. She reports that she does not use drugs.  Allergies: Allergies  Allergen Reactions  . Erythromycin Nausea And Vomiting  . Iohexol Hives     Code: HIVES, Desc: RN AMY NOTICED HIVE ON STOMACH AFTER HEART SCAN. ALSO ONE ON BACK. NOT ITCHING UNTIL AFTER SHE KNEW OF HIVES. DR.NISHAN NOTIFIED. GIVEN BENADRYL., Onset Date: [id number]   . Tetracycline Hives    Family History:  Family History  Problem Relation Age of Onset  . Sarcoidosis Mother   . Heart attack Father   . Hypertension Father   . Hyperlipidemia Father   . Melanoma Father   . Cancer Father   . Healthy Brother      Current Outpatient Medications:  .  amoxicillin (AMOXIL) 500 MG capsule, Take 4 capsules (2,000mg ) by mouth 1 hour prior to dental procedure., Disp: 4 capsule, Rfl: 0 .  cetirizine (ZYRTEC) 10 MG tablet, Take 10 mg by mouth daily., Disp: , Rfl:  .  cyanocobalamin 1000 MCG tablet, Take 1,000 mcg by mouth daily., Disp: , Rfl:  .  MAGNESIUM PO, Take 200 mg by mouth daily. , Disp: , Rfl:  .  Multiple Vitamin (MULTIVITAMIN) tablet, Take 1 tablet by mouth daily., Disp: , Rfl:  .  norethindrone (MICRONOR) 0.35 MG tablet, Take 1 tablet by mouth daily., Disp: , Rfl:   Review of Systems:  Constitutional: Denies fever, chills, diaphoresis, appetite change and fatigue.  HEENT: Denies photophobia, eye pain, redness, hearing loss, ear pain, congestion, sore throat, rhinorrhea, sneezing, mouth sores, trouble swallowing, neck pain, neck stiffness and tinnitus.   Respiratory: Denies SOB, DOE, cough, chest tightness,  and wheezing.   Cardiovascular: Denies chest pain, palpitations and  leg swelling.  Gastrointestinal: Denies nausea, vomiting, abdominal pain, diarrhea, constipation, blood in stool and abdominal distention.  Genitourinary: Denies dysuria, urgency, frequency, hematuria, flank pain and difficulty urinating.  Endocrine: Denies: hot or cold intolerance, sweats, changes in hair or nails, polyuria, polydipsia. Musculoskeletal: Denies joint swelling, arthralgias. Skin: Denies pallor, rash and wound.  Neurological: Denies dizziness, seizures, syncope, weakness, light-headedness, numbness and headaches.  Hematological: Denies adenopathy. Easy bruising, personal or family bleeding history  Psychiatric/Behavioral: Denies suicidal ideation, mood changes, confusion, nervousness, sleep disturbance and agitation    Physical Exam: Vitals:   05/22/19 1356  BP: 120/84  Pulse: 66  Temp: 97.8 F (36.6 C)  TempSrc: Temporal  SpO2: 97%  Weight: 146 lb 9.6 oz (66.5 kg)  Height: 5\' 6"  (1.676 m)    Body mass index is 23.66 kg/m.   Constitutional: NAD, calm, comfortable Eyes: PERRL, lids and conjunctivae normal ENMT: Mucous membranes are moist.  Respiratory: clear to auscultation bilaterally, no wheezing, no crackles. Normal respiratory effort. No accessory muscle use.  Cardiovascular: Regular rate and rhythm, loud systolic murmur, no rubs / gallops. No extremity edema.  Neurologic: Grossly intact and nonfocal. Psychiatric: Normal judgment and insight. Alert and oriented x 3. Normal mood.    Impression and Plan:  Screening for malignant neoplasm of colon  - Plan: Ambulatory referral to Gastroenterology  Essential hypertension -Blood pressure has been low, not on medications  Mild intermittent asthma without complication -Well-controlled on as needed rescue inhaler  Chronic bilateral low back pain without sciatica -Referred to outpatient physical therapy today. -Have discussed how sports medicine referral might be beneficial in the future.    Patient  Instructions  -Nice seeing you today!!  -Will arrange for outpatient physical therapy for your back issues.  -Let me know when you are ready for your sports medicine referral.  -Schedule follow up at your convenience for your physical. Please come in fasting that day.     , MD [facility]

## 2019-05-27 ENCOUNTER — Telehealth: Payer: Self-pay | Admitting: Internal Medicine

## 2019-05-27 DIAGNOSIS — G8929 Other chronic pain: Secondary | ICD-10-CM

## 2019-05-27 NOTE — Telephone Encounter (Signed)
Pt is calling regarding a referral for physical therapy. Pt states, when she was seen last month a referral was supposed to be put in and has not received a call. Pt is aware that you are out of the office today and will return on 05/28/19. Thanks

## 2019-05-28 NOTE — Telephone Encounter (Signed)
Referral for PT placed

## 2019-05-28 NOTE — Addendum Note (Signed)
Addended by: Kern Reap B on: 05/28/2019 11:04 AM   Modules accepted: Orders

## 2019-05-30 ENCOUNTER — Other Ambulatory Visit: Payer: Self-pay

## 2019-05-30 ENCOUNTER — Ambulatory Visit: Payer: BC Managed Care – PPO | Attending: Internal Medicine | Admitting: Physical Therapy

## 2019-05-30 ENCOUNTER — Encounter: Payer: Self-pay | Admitting: Physical Therapy

## 2019-05-30 DIAGNOSIS — M545 Low back pain, unspecified: Secondary | ICD-10-CM

## 2019-05-30 DIAGNOSIS — R252 Cramp and spasm: Secondary | ICD-10-CM

## 2019-05-30 DIAGNOSIS — M6281 Muscle weakness (generalized): Secondary | ICD-10-CM | POA: Diagnosis not present

## 2019-05-30 NOTE — Patient Instructions (Signed)

## 2019-05-30 NOTE — Therapy (Signed)
[facility] [address] [place], [place], [address] Phone: [phone]   Fax:  [phone]  Physical Therapy Evaluation  Patient Details  Name: Elizabeth Lozano MRN: [id number] Date of Birth: 02-12-1971 Referring Provider (PT): Dr. Limmie Patricia. Philip Aspen   Encounter Date: 05/30/2019  PT End of Session - 05/30/19 1057    Visit Number  1    Date for PT Re-Evaluation  08/22/19    Authorization Type  [employer]    Activity Tolerance  Patient tolerated treatment well;No increased pain    Behavior During Therapy  WFL for tasks assessed/performed       Past Medical History:  Diagnosis Date  . Allergy    she has seen Dr. Eileen Stanford   . Anemia    long term issues per pt  . Asthma   . History of stress test    ETT-echo (9/14):  ST depression noted on ECGs; no wall motion abnormalities at peak exercise on echocardiogram  . VSD (ventricular septal defect)    Cardiac CTA 05/2008: Membranous VSD, 8-9 mm in diameter, mild RVE, EF 64%, calcium score 0, no CAD. Last echo 02/2011: EF 55-60%, mild CAD, perimembranous VSD slightly more prominent but no RVE or pulmonary hypertension;  Echo (9/14): Small perimembranous VSD-no significant change since 02/2011; EF 55-60%, moderate LAE    Past Surgical History:  Procedure Laterality Date  . cyst left wrist      There were no vitals filed for this visit.   Subjective Assessment - 05/30/19 1020    Subjective  Patient reports she had therapy in the past due to her scoliosis. Patient has a standing desk. 03/18/2019 patient moved heavy furniture and hurt her low back, I did not start therapy then due to COVID.    How long can you sit comfortably?  10 minutes    How long can you stand comfortably?  1.5 hours, used to do 12 hours    Patient Stated Goals  standing, sitting, return to daily tasks    Currently in Pain?  Yes    Pain Score  4    9/10   Pain Location  Back    Pain Orientation   Lower;Left;Right    Pain Descriptors / Indicators  Aching;Constant;Dull;Spasm    Pain Type  Acute pain    Pain Onset  More than a month ago    Pain Frequency  Constant    Aggravating Factors   sit, stand, walking, movement    Pain Relieving Factors  ice         [facility] PT Assessment - 05/30/19 0001      Assessment   Medical Diagnosis  M54.5; G89.29 Chronic bilateral low back pain without sciatica    Referring Provider (PT)  Dr. Limmie Patricia. Philip Aspen    Onset Date/Surgical Date  03/18/19    Prior Therapy  yes several years ago      Precautions   Precautions  None      Restrictions   Weight Bearing Restrictions  No      Balance Screen   Has the patient fallen in the past 6 months  No    Has the patient had a decrease in activity level because of a fear of falling?   No    Is the patient reluctant to leave their home because of a fear of falling?   No      Home Public house manager residence  Prior Function   Level of Independence  Independent    Vocation  Full time employment    Leisure  walk dog 2 times per day      Cognition   Overall Cognitive Status  Within Functional Limits for tasks assessed      Observation/Other Assessments   Focus on Therapeutic Outcomes (FOTO)   57% limitation; goal is 33% limitation      Posture/Postural Control   Posture/Postural Control  Postural limitations    Postural Limitations  Increased thoracic kyphosis;Rounded Shoulders;Forward head    Posture Comments  holds shoulders up, hips shifted to the left ; right ilium higher than left      ROM / Strength   AROM / PROM / Strength  AROM;PROM;Strength      AROM   Lumbar Flexion  full, used hands to get back up    Lumbar Extension  decreased by 50%    Lumbar - Right Side Bend  decreased by 25%    Lumbar - Left Side Bend  full    Lumbar - Right Rotation  decreased by 25%    Lumbar - Left Rotation  decreased by 25%      PROM   Right Hip External Rotation    60    Left Hip External Rotation   30      Strength   Right Hip ABduction  4/5    Left Hip ABduction  3/5      Palpation   Spinal mobility  decreased movement of L1-L5    Palpation comment  tenderness located in left quadratus, left gluteal and lateral hip                Objective measurements completed on examination: See above findings.      [facility] Adult PT Treatment/Exercise - 05/30/19 0001      Manual Therapy   Manual Therapy  Soft tissue mobilization;Joint mobilization    Joint Mobilization  sideglide to L1-L5 on the left grade 3    Soft tissue mobilization  left quadratus and lumbar paraspinals to elongate after dry needling       Trigger Point Dry Needling - 05/30/19 0001    Consent Given?  Yes    Education Handout Provided  Yes    Muscles Treated Back/Hip  Quadratus lumborum;Lumbar multifidi   left   Lumbar multifidi Response  Twitch response elicited;Palpable increased muscle length    Quadratus Lumborum Response  Twitch response elicited;Palpable increased muscle length           PT Education - 05/30/19 1057    Education Details  information on dry needling    Person(s) Educated  Patient    Methods  Explanation;Handout    Comprehension  Verbalized understanding       PT Short Term Goals - 05/30/19 1108      PT SHORT TERM GOAL #1   Title  be independent with initial HEP    Time  4    Period  Weeks    Status  New    Target Date  06/27/19      PT SHORT TERM GOAL #2   Title  report a 30% reduction in Lt hip and back pain with sitting and standing    Time  4    Period  Weeks    Status  New    Target Date  06/27/19        PT Long Term Goals - 05/30/19 1109  PT LONG TERM GOAL #1   Title  be independent in advanced HEP    Time  12    Period  Weeks    Status  New    Target Date  08/22/19      PT LONG TERM GOAL #2   Title  reduce FOTO to < or = to 33% limitation    Time  12    Period  Weeks    Status  New    Target Date   08/22/19      PT LONG TERM GOAL #3   Title  report a 60% reduction in Lt hip and back pain with sitting and standing    Time  12    Period  Weeks    Status  New    Target Date  08/22/19      PT LONG TERM GOAL #4   Title  able to perfrom her usual daily activities with pain decreased >/= 75%    Time  12    Period  Weeks    Status  New    Target Date  08/22/19      PT LONG TERM GOAL #5   Title  able to go up and down stairs with >/= 75% reduction in pain due to improve strength of her hips and core    Time  12    Period  Weeks    Status  New    Target Date  08/22/19             Plan - 05/30/19 1057    Clinical Impression Statement  Patient is a 49 year old female with chronic low back pain. Patient reports on 03/18/2019 she moved heavy furniture and hurt her back. Patient has had therapy in the past for her back and it has helped. Patient reports her pain will range from 3-9/10 with movement, sitting more than 10 minutes or standing more than 1.5 hours. Patient stands with increased thoracic kyphosis, flat lumbar lordosis, hips to the right, forward head, left ilium is higher than right and scoliosis. Patient has weakness in the hip abductors and extensors. Patient has palpable tenderness located in left quadratus, gluteals, and lateral left hip. Patient is not able to do her exercises, daily activities, sit or stand for work activities. Patient will benefit from skilled therapy to improve lumbar ROM and strength and improve strength while reducing her pain.    Examination-Activity Limitations  Sit;Stand;Lift;Squat;Carry;Locomotion Level    Examination-Participation Restrictions  Cleaning;Driving;Shop;Laundry;Community Activity    Stability/Clinical Decision Making  Evolving/Moderate complexity    Clinical Decision Making  Moderate    Rehab Potential  Excellent    PT Frequency  2x / week    PT Duration  12 weeks    PT Treatment/Interventions  Cryotherapy;Electrical  Stimulation;Iontophoresis 4mg /ml Dexamethasone;Moist Heat;Traction;Ultrasound;Neuromuscular re-education;Therapeutic exercise;Therapeutic activities;Patient/family education;Manual techniques;Dry needling;Taping;Spinal Manipulations;Joint Manipulations    PT Next Visit Plan  See if dry needling has helped; Dry needle the lumbar multifidi, left gluteal; back stretches; body mechanics with task, core and hip strength    Consulted and Agree with Plan of Care  Patient       Patient will benefit from skilled therapeutic intervention in order to improve the following deficits and impairments:  Decreased range of motion, Increased fascial restricitons, Decreased endurance, Decreased activity tolerance, Pain, Decreased mobility, Decreased strength  Visit Diagnosis: Muscle weakness (generalized) - Plan: PT plan of care cert/re-cert  Cramp and spasm - Plan: PT plan of care cert/re-cert  Acute bilateral low back pain without sciatica - Plan: PT plan of care cert/re-cert     Problem List Patient Active Problem List   Diagnosis Date Noted  . 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 Hypertension 05/03/2016  . Anemia due to other cause 03/24/2014  . Routine general medical examination at a health care facility 03/24/2014  . VSD (ventricular septal defect) 03/09/2011  . URTICARIA 06/03/2008  . SEBACEOUS CYST 10/09/2007  . Asthma 10/31/2006    Eulis Foster, PT 05/30/19 11:14 AM   [facility] [address] [place], [place], [address] Phone: [phone]   Fax:  [phone]  Name: Elizabeth Lozano MRN: [id number] Date of Birth: 05-25-1970

## 2019-06-03 ENCOUNTER — Other Ambulatory Visit: Payer: Self-pay

## 2019-06-03 ENCOUNTER — Encounter: Payer: Self-pay | Admitting: Physical Therapy

## 2019-06-03 ENCOUNTER — Ambulatory Visit: Payer: BC Managed Care – PPO | Admitting: Physical Therapy

## 2019-06-03 DIAGNOSIS — R252 Cramp and spasm: Secondary | ICD-10-CM | POA: Diagnosis not present

## 2019-06-03 DIAGNOSIS — M545 Low back pain, unspecified: Secondary | ICD-10-CM

## 2019-06-03 DIAGNOSIS — M6281 Muscle weakness (generalized): Secondary | ICD-10-CM | POA: Diagnosis not present

## 2019-06-03 NOTE — Therapy (Signed)
[facility] [address] [place], [place], [address] Phone: [phone]   Fax:  [phone]  Physical Therapy Treatment  Patient Details  Name: Elizabeth Lozano MRN: [id number] Date of Birth: Jan 15, 1971 Referring Provider (PT): Dr. Limmie Patricia. Philip Aspen   Encounter Date: 06/03/2019  PT End of Session - 06/03/19 0800    Visit Number  2    Date for PT Re-Evaluation  08/22/19    Authorization Type  BCBS    PT Start Time  0800    PT Stop Time  0845    PT Time Calculation (min)  45 min    Activity Tolerance  Patient tolerated treatment well;No increased pain    Behavior During Therapy  WFL for tasks assessed/performed       Past Medical History:  Diagnosis Date  . Allergy    she has seen Dr. Eileen Stanford   . Anemia    long term issues per pt  . Asthma   . History of stress test    ETT-echo (9/14):  ST depression noted on ECGs; no wall motion abnormalities at peak exercise on echocardiogram  . VSD (ventricular septal defect)    Cardiac CTA 05/2008: Membranous VSD, 8-9 mm in diameter, mild RVE, EF 64%, calcium score 0, no CAD. Last echo 02/2011: EF 55-60%, mild CAD, perimembranous VSD slightly more prominent but no RVE or pulmonary hypertension;  Echo (9/14): Small perimembranous VSD-no significant change since 02/2011; EF 55-60%, moderate LAE    Past Surgical History:  Procedure Laterality Date  . cyst left wrist      There were no vitals filed for this visit.  Subjective Assessment - 06/03/19 0800    Subjective  DN helped x 40 hours.  I would like to build a HEP today.  My left hip, thigh and lateral thigh are knotted and locked up.    How long can you sit comfortably?  10 minutes    How long can you stand comfortably?  1.5 hours, used to do 12 hours    Patient Stated Goals  standing, sitting, return to daily tasks    Currently in Pain?  Yes    Pain Score  5     Pain Location  Back    Pain Orientation   Left;Right;Lower    Pain Descriptors / Indicators  Aching;Dull;Spasm    Pain Type  Acute pain    Pain Onset  More than a month ago    Pain Frequency  Constant    Aggravating Factors   sit, stand, walking, movement                       [facility] Adult PT Treatment/Exercise - 06/03/19 0001      Self-Care   Self-Care  Posture    Posture  sitting posture scoop flesh from under buttock and center femoral head on Lt, improved pain in sitting      Exercises   Exercises  Lumbar;Knee/Hip      Lumbar Exercises: Stretches   Hip Flexor Stretch  Left;2 reps;30 seconds    Hip Flexor Stretch Limitations  one x kneeling on foam, 1x in thomas test position   signif Lt LBP relief following kneeling hip flexor stretch   Quad Stretch  Left;1 rep;20 seconds    Quad Stretch Limitations  standing, ipsilateral UE/LE    Other Lumbar Stretch Exercise  seated flexion/SB with large green ball bil 10 x 5 sec, PT demo'd "  kitchen sink" hang to create same stretch since pt doesn't have ball      Lumbar Exercises: Standing   Shoulder Extension  Strengthening;Both;10 reps;Theraband   5 sec hold   Theraband Level (Shoulder Extension)  Level 2 (Red)    Shoulder Extension Limitations  PT cued awareness of posture and TrA    Other Standing Lumbar Exercises  standing SB stretch with arms overhead, 3x10 sec    Other Standing Lumbar Exercises  seated thoracic rotation A/ROM bil x 10             PT Education - 06/03/19 0843    Education Details  Access Code: [email or web address]    Person(s) Educated  Patient    Methods  Explanation;Demonstration;Handout;Verbal cues    Comprehension  Verbalized understanding;Returned demonstration       PT Short Term Goals - 06/03/19 1223      PT SHORT TERM GOAL #1   Title  be independent with initial HEP    Baseline  initiated today    Status  On-going        PT Long Term Goals - 05/30/19 1109      PT LONG TERM GOAL #1   Title  be independent in advanced HEP     Time  12    Period  Weeks    Status  New    Target Date  08/22/19      PT LONG TERM GOAL #2   Title  reduce FOTO to < or = to 33% limitation    Time  12    Period  Weeks    Status  New    Target Date  08/22/19      PT LONG TERM GOAL #3   Title  report a 60% reduction in Lt hip and back pain with sitting and standing    Time  12    Period  Weeks    Status  New    Target Date  08/22/19      PT LONG TERM GOAL #4   Title  able to perfrom her usual daily activities with pain decreased >/= 75%    Time  12    Period  Weeks    Status  New    Target Date  08/22/19      PT LONG TERM GOAL #5   Title  able to go up and down stairs with >/= 75% reduction in pain due to improve strength of her hips and core    Time  12    Period  Weeks    Status  New    Target Date  08/22/19            Plan - 06/03/19 1218    Clinical Impression Statement  Pt wished to work on development of HEP to work on between now and next visit due to gap in upcoming appointments.  She did report intial DN of QL on Lt helped x 40 hours.  She continues to have pain in sitting and standing.  PT educated Pt on sitting posture ("scoop flesh of buttock on Lt and seat femoral head more posteriorly"), LE stretches and initial standing TrA activation with bil shoulder ext in good standing posture.  She had been doing kneeling hip flexor stretch incorrectly and noted signif relief in Lt lower quadrant pain after proper technique.  Pt will continue to benefit from Pt education, manual techniques, stabilization, stretching and functional body mechanics to address pain and deficits.  Rehab Potential  Excellent    PT Frequency  2x / week    PT Duration  12 weeks    PT Treatment/Interventions  Cryotherapy;Electrical Stimulation;Iontophoresis 4mg /ml Dexamethasone;Moist Heat;Traction;Ultrasound;Neuromuscular re-education;Therapeutic exercise;Therapeutic activities;Patient/family education;Manual techniques;Dry  needling;Taping;Spinal Manipulations;Joint Manipulations    PT Next Visit Plan  f/u on HEP, add red band shoulder flexion bil in standing for deep multifi, DN Lt quad/ITB/lateral hip/QL, progress body mechanics, hip and core strength    PT Home Exercise Plan  Access Code: [email or web address]    Consulted and Agree with Plan of Care  Patient       Patient will benefit from skilled therapeutic intervention in order to improve the following deficits and impairments:     Visit Diagnosis: Muscle weakness (generalized)  Cramp and spasm  Acute bilateral low back pain without sciatica     Problem List Patient Active Problem List   Diagnosis Date Noted  . 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 Hypertension 05/03/2016  . Anemia due to other cause 03/24/2014  . Routine general medical examination at a health care facility 03/24/2014  . VSD (ventricular septal defect) 03/09/2011  . URTICARIA 06/03/2008  . SEBACEOUS CYST 10/09/2007  . Asthma 10/31/2006    Baruch Merl, PT 06/03/19 12:32 PM    [facility] [address] [place], [place], [address] Phone: [phone]   Fax:  [phone]  Name: Elizabeth Lozano MRN: [id number] Date of Birth: 1970/05/04

## 2019-06-03 NOTE — Patient Instructions (Signed)
Access Code: [email or web address] URL: [email or web address]: 04/19/2021Prepared by: Loistine Simas BeuhringExercises  TL Sidebending Stretch - Arms Overhead - 1 x daily - 7 x weekly - 1 sets - 3 reps - 10-30 hold  Half Kneeling Hip Flexor Stretch - 1 x daily - 7 x weekly - 1 sets - 3 reps - 10-30 hold  Quadriceps Stretch with Chair - 1 x daily - 7 x weekly - 1 sets - 3 reps - 10-30 hold  Seated Trunk Rotation - Arms Crossed - 1 x daily - 7 x weekly - 1 sets - 10 reps - 2-3 hold  Seated Thoracic Flexion and Rotation with Swiss Ball - 1 x daily - 7 x weekly - 1 sets - 5 reps - 5-10 hold  Thomas Stretch on Table - 1 x daily - 7 x weekly - 1 sets - 1 reps - 20-30 hold  Shoulder extension with resistance - Neutral - 1 x daily - 7 x weekly - 2 sets - 10 reps - 5 hold

## 2019-06-10 ENCOUNTER — Encounter: Payer: Self-pay | Admitting: Physical Therapy

## 2019-06-10 ENCOUNTER — Ambulatory Visit: Payer: BC Managed Care – PPO | Admitting: Physical Therapy

## 2019-06-10 ENCOUNTER — Other Ambulatory Visit: Payer: Self-pay

## 2019-06-10 ENCOUNTER — Ambulatory Visit (AMBULATORY_SURGERY_CENTER): Payer: BC Managed Care – PPO | Admitting: *Deleted

## 2019-06-10 VITALS — Ht 66.0 in | Wt 145.0 lb

## 2019-06-10 DIAGNOSIS — R252 Cramp and spasm: Secondary | ICD-10-CM

## 2019-06-10 DIAGNOSIS — M6281 Muscle weakness (generalized): Secondary | ICD-10-CM | POA: Diagnosis not present

## 2019-06-10 DIAGNOSIS — M545 Low back pain, unspecified: Secondary | ICD-10-CM

## 2019-06-10 DIAGNOSIS — Z1211 Encounter for screening for malignant neoplasm of colon: Secondary | ICD-10-CM

## 2019-06-10 MED ORDER — SUTAB 1479-225-188 MG PO TABS: 24.0000 | ORAL_TABLET | ORAL | 0 refills | Status: DC

## 2019-06-10 NOTE — Therapy (Signed)
[facility] [address], [place], [place], [address] Phone: [phone]   Fax:  [phone]  Physical Therapy Treatment  Patient Details  Name: Elizabeth Lozano MRN: [id number] Date of Birth: 1970-04-17 Referring Provider (PT): Dr. Rayford Halsted. Isaac Bliss   Encounter Date: 06/10/2019  PT End of Session - 06/10/19 1305    Visit Number  3    Date for PT Re-Evaluation  08/22/19    Authorization Type  BCBS    PT Start Time  0756    PT Stop Time  0842    PT Time Calculation (min)  46 min    Activity Tolerance  Patient tolerated treatment well    Behavior During Therapy  [facility] for tasks assessed/performed       Past Medical History:  Diagnosis Date  . Allergy    she has seen Dr. Tiajuana Amass   . Anemia    long term issues per pt  . Asthma   . History of stress test    ETT-echo (9/14):  ST depression noted on ECGs; no wall motion abnormalities at peak exercise on echocardiogram  . VSD (ventricular septal defect)    Cardiac CTA 05/2008: Membranous VSD, 8-9 mm in diameter, mild RVE, EF 64%, calcium score 0, no CAD. Last echo 02/2011: EF 55-60%, mild CAD, perimembranous VSD slightly more prominent but no RVE or pulmonary hypertension;  Echo (9/14): Small perimembranous VSD-no significant change since 02/2011; EF 55-60%, moderate LAE    Past Surgical History:  Procedure Laterality Date  . cyst left wrist      There were no vitals filed for this visit.  Subjective Assessment - 06/10/19 1301    Subjective  The red band pullbacks flared up my Lt shoulder and both lats.  I stopped doing those.  The other stretches are fine.  I had a massage on Wed and the massage therapist said my Lt hip/TFL are a mess.    How long can you sit comfortably?  10 minutes    How long can you stand comfortably?  1.5 hours, used to do 12 hours    Patient Stated Goals  standing, sitting, return to daily tasks    Currently in Pain?  Yes    Pain Score  7      Pain Location  Hip    Pain Orientation  Left;Lateral;Upper;Posterior    Pain Descriptors / Indicators  Tightness;Aching;Spasm;Dull    Pain Type  Acute pain    Pain Onset  More than a month ago    Pain Frequency  Constant    Aggravating Factors   sit, stand, walk, movement    Pain Relieving Factors  ice                       [facility] Adult PT Treatment/Exercise - 06/10/19 0001      Self-Care   Self-Care  Other Self-Care Comments    Other Self-Care Comments   spikey roller self-massage bil ITB and quads, lacross ball self-massage to bil hips oscillating rolling or deep static pressure release      Lumbar Exercises: Stretches   Figure 4 Stretch  1 rep;30 seconds;With overpressure;Supine    Figure 4 Stretch Limitations  push knee away and pull knee across, demo'd how Pt could do this supine with foot on wall      Knee/Hip Exercises: Stretches   ITB Stretch Limitations  attempted supine with strap horiz adduction but Pt was unable  to engage stretch, Lt    Piriformis Stretch  Left;1 rep;30 seconds      Manual Therapy   Manual Therapy  Soft tissue mobilization    Soft tissue mobilization  Lt QL, Lt lat/post hip after DN       Trigger Point Dry Needling - 06/10/19 0001    Consent Given?  Yes    Education Handout Provided  Previously provided    Muscles Treated Back/Hip  Gluteus minimus;Gluteus medius;Piriformis    Other Dry Needling  Left    Gluteus Minimus Response  Twitch response elicited;Palpable increased muscle length    Gluteus Medius Response  Twitch response elicited;Palpable increased muscle length    Piriformis Response  Twitch response elicited;Palpable increased muscle length             PT Short Term Goals - 06/10/19 1305      PT SHORT TERM GOAL #1   Title  be independent with initial HEP    Status  On-going      PT SHORT TERM GOAL #2   Title  report a 30% reduction in Lt hip and back pain with sitting and standing    Status  On-going         PT Long Term Goals - 05/30/19 1109      PT LONG TERM GOAL #1   Title  be independent in advanced HEP    Time  12    Period  Weeks    Status  New    Target Date  08/22/19      PT LONG TERM GOAL #2   Title  reduce FOTO to < or = to 33% limitation    Time  12    Period  Weeks    Status  New    Target Date  08/22/19      PT LONG TERM GOAL #3   Title  report a 60% reduction in Lt hip and back pain with sitting and standing    Time  12    Period  Weeks    Status  New    Target Date  08/22/19      PT LONG TERM GOAL #4   Title  able to perfrom her usual daily activities with pain decreased >/= 75%    Time  12    Period  Weeks    Status  New    Target Date  08/22/19      PT LONG TERM GOAL #5   Title  able to go up and down stairs with >/= 75% reduction in pain due to improve strength of her hips and core    Time  12    Period  Weeks    Status  New    Target Date  08/22/19            Plan - 06/10/19 1310    Clinical Impression Statement  Pt arrived with report of flare up of Lt shoulder girdle/lat after red band shoulder extension last visit which has taken most of the week to reset from.  Pain had localized mostly to Lt hip and Lt lower back again.  PT performed DN to Lt lateral and posterior hip and STM to QL/hip muscles after DN.  Signif tone was present in hip before needling with signif twitch/release afterwards.  PT and Pt explored more self-massage/release options today with lacrosse ball for hip and roller for ITB/quad which Pt liked.  PT encouraged Pt to focus on Lt hip  stretching via fig 4 in addition to stretches from last appt.  Pt will continue to benefit from ongoing assessment of response to treatment.  Continue along POC.    Examination-Participation Restrictions  Cleaning;Driving;Shop;Laundry;Community Activity    Stability/Clinical Decision Making  Evolving/Moderate complexity    Rehab Potential  Excellent    PT Frequency  2x / week    PT Duration  12  weeks    PT Treatment/Interventions  Cryotherapy;Electrical Stimulation;Iontophoresis 4mg /ml Dexamethasone;Moist Heat;Traction;Ultrasound;Neuromuscular re-education;Therapeutic exercise;Therapeutic activities;Patient/family education;Manual techniques;Dry needling;Taping;Spinal Manipulations;Joint Manipulations    PT Next Visit Plan  f/u on Lt hip DN and self-release techniques (lacrosse ball and roller) and stretches, fig 4 stretch and Lt hip ROM, progress body mechanics, hip and core strength    PT Home Exercise Plan  Access Code: [email or web address]    Consulted and Agree with Plan of Care  Patient       Patient will benefit from skilled therapeutic intervention in order to improve the following deficits and impairments:  Decreased range of motion, Increased fascial restricitons, Decreased endurance, Decreased activity tolerance, Pain, Decreased mobility, Decreased strength  Visit Diagnosis: Muscle weakness (generalized)  Cramp and spasm  Acute bilateral low back pain without sciatica     Problem List Patient Active Problem List   Diagnosis Date Noted  . 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 Hypertension 05/03/2016  . Anemia due to other cause 03/24/2014  . Routine general medical examination at a health care facility 03/24/2014  . VSD (ventricular septal defect) 03/09/2011  . URTICARIA 06/03/2008  . SEBACEOUS CYST 10/09/2007  . Asthma 10/31/2006    11/02/2006, PT 06/10/19 1:19 PM   [facility] [address] [place], [place], [place] Phone: [phone]   Fax:  [phone]  Name: Elizabeth Lozano MRN: [name] Date of Birth: [birth year]

## 2019-06-10 NOTE — Progress Notes (Signed)
Pt has hurt her back and called and requested Virtual PV_  Pt verified name, DOB, address and insurance during PV today. Pt mailed instruction packet to included paper to complete and mail back to [facility] with addressed and stamped envelope, Emmi video, copy of consent form to read and not return, and instructions. PV completed over the phone. Pt encouraged to call with questions or issues   Completed covid vaccines 05-23-2019  Pt states she cannot swallow pills well and requested Miralax prep-   Called [facility] and canceled Sutab script   No egg or soy allergy known to patient  No issues with past sedation with any surgeries  or procedures, no intubation problems  No diet pills per patient No home 02 use per patient  No blood thinners per patient  Pt denies issues with constipation  No A fib or A flutter  EMMI video sent to pt's e mail   Due to the COVID-19 pandemic we are asking patients to follow these guidelines. Please only bring one care partner. Please be aware that your care partner may wait in the car in the parking lot or if they feel like they will be too hot to wait in the car, they may wait in the lobby on the 4th floor. All care partners are required to wear a mask the entire time (we do not have any that we can provide them), they need to practice social distancing, and we will do a Covid check for all patient's and care partners when you arrive. Also we will check their temperature and your temperature. If the care partner waits in their car they need to stay in the parking lot the entire time and we will call them on their cell phone when the patient is ready for discharge so they can bring the car to the front of the building. Also all patient's will need to wear a mask into building.

## 2019-06-11 ENCOUNTER — Encounter: Payer: Self-pay | Admitting: Internal Medicine

## 2019-06-11 ENCOUNTER — Telehealth (INDEPENDENT_AMBULATORY_CARE_PROVIDER_SITE_OTHER): Payer: BC Managed Care – PPO | Admitting: Internal Medicine

## 2019-06-11 DIAGNOSIS — F411 Generalized anxiety disorder: Secondary | ICD-10-CM

## 2019-06-11 MED ORDER — SERTRALINE HCL 50 MG PO TABS: 50.0000 mg | ORAL_TABLET | Freq: Every day | ORAL | 1 refills | Status: DC

## 2019-06-11 NOTE — Progress Notes (Signed)
Virtual Visit via Video Note  I connected with Elizabeth Lozano on 06/11/19 at 11:00 AM EDT by a video enabled telemedicine application and verified that I am speaking with the correct person using two identifiers.  Location patient: home Location provider: work office Persons participating in the virtual visit: patient, provider  I discussed the limitations of evaluation and management by telemedicine and the availability of in person appointments. The patient expressed understanding and agreed to proceed.   HPI: She has scheduled this visit to discuss anxiety and sleeping issues.  She has not slept in the 4 nights.  The worry of not being able to sleep keeps her awake, she is also very tearful, has had increased anxiety, notices that she worries about little things.  She is tearful during today's video visit.   ROS: Constitutional: Denies fever, chills, diaphoresis, appetite change and fatigue.  HEENT: Denies photophobia, eye pain, redness, hearing loss, ear pain, congestion, sore throat, rhinorrhea, sneezing, mouth sores, trouble swallowing, neck pain, neck stiffness and tinnitus.   Respiratory: Denies SOB, DOE, cough, chest tightness,  and wheezing.   Cardiovascular: Denies chest pain, palpitations and leg swelling.  Gastrointestinal: Denies nausea, vomiting, abdominal pain, diarrhea, constipation, blood in stool and abdominal distention.  Genitourinary: Denies dysuria, urgency, frequency, hematuria, flank pain and difficulty urinating.  Endocrine: Denies: hot or cold intolerance, sweats, changes in hair or nails, polyuria, polydipsia. Musculoskeletal: Denies myalgias, back pain, joint swelling, arthralgias and gait problem.  Skin: Denies pallor, rash and wound.  Neurological: Denies dizziness, seizures, syncope, weakness, light-headedness, numbness and headaches.  Hematological: Denies adenopathy. Easy bruising, personal or family bleeding history  Psychiatric/Behavioral: Denies  suicidal ideation,  Confusion and agitation   Past Medical History:  Diagnosis Date  . Allergy    she has seen Dr. Eileen Stanford   . Anemia    long term issues per pt  . Anxiety    due to back pain   . Asthma   . Depression   . Heart murmur   . History of stress test    ETT-echo (9/14):  ST depression noted on ECGs; no wall motion abnormalities at peak exercise on echocardiogram  . VSD (ventricular septal defect)    Cardiac CTA 05/2008: Membranous VSD, 8-9 mm in diameter, mild RVE, EF 64%, calcium score 0, no CAD. Last echo 02/2011: EF 55-60%, mild CAD, perimembranous VSD slightly more prominent but no RVE or pulmonary hypertension;  Echo (9/14): Small perimembranous VSD-no significant change since 02/2011; EF 55-60%, moderate LAE    Past Surgical History:  Procedure Laterality Date  . cyst left wrist    . dental implants    . WISDOM TOOTH EXTRACTION      Family History  Problem Relation Age of Onset  . Sarcoidosis Mother   . Colon polyps Mother   . Heart attack Father   . Hypertension Father   . Hyperlipidemia Father   . Melanoma Father   . Cancer Father   . Healthy Brother   . Colon cancer Paternal Grandmother   . Crohn's disease Neg Hx   . Esophageal cancer Neg Hx   . Rectal cancer Neg Hx   . Stomach cancer Neg Hx     SOCIAL HX:   reports that she has never smoked. She has never used smokeless tobacco. She reports current alcohol use of about 4.0 standard drinks of alcohol per week. She reports that she does not use drugs.   Current Outpatient Medications:  .  acetaminophen (  TYLENOL) 500 MG tablet, Take 500 mg by mouth every 6 (six) hours as needed., Disp: , Rfl:  .  amoxicillin (AMOXIL) 500 MG capsule, Take 4 capsules (2,000mg ) by mouth 1 hour prior to dental procedure., Disp: 4 capsule, Rfl: 0 .  cetirizine (ZYRTEC) 10 MG tablet, Take 10 mg by mouth daily., Disp: , Rfl:  .  cyanocobalamin 1000 MCG tablet, Take 1,000 mcg by mouth daily., Disp: , Rfl:  .  MAGNESIUM  PO, Take 200 mg by mouth daily. , Disp: , Rfl:  .  Multiple Vitamin (MULTIVITAMIN) tablet, Take 1 tablet by mouth daily. Centrum Mini's for Women 50 Plus, Disp: , Rfl:  .  norethindrone (MICRONOR) 0.35 MG tablet, Take 1 tablet by mouth daily., Disp: , Rfl:  .  OVER THE COUNTER MEDICATION, CBD Gummies  Daily, Disp: , Rfl:  .  sertraline (ZOLOFT) 50 MG tablet, Take 1 tablet (50 mg total) by mouth daily., Disp: 90 tablet, Rfl: 1  EXAM:   VITALS per patient if applicable: None reported  GENERAL: alert, oriented, appears tearful  HEENT: atraumatic, conjunttiva clear, no obvious abnormalities on inspection of external nose and ears  NECK: normal movements of the head and neck  LUNGS: on inspection no signs of respiratory distress, breathing rate appears normal, no obvious gross increased work of breathing, gasping or wheezing  CV: no obvious cyanosis  MS: moves all visible extremities without noticeable abnormality  PSYCH/NEURO: pleasant and cooperative,  speech and thought processing grossly intact tearful and anxious throughout our visit today.  ASSESSMENT AND PLAN:   GAD (generalized anxiety disorder) -Start Zoloft 50 mg at bedtime, this will also likely help with sleeping issues. -Check GAD-7 at next visit.    I discussed the assessment and treatment plan with the patient. The patient was provided an opportunity to ask questions and all were answered. The patient agreed with the plan and demonstrated an understanding of the instructions.   The patient was advised to call back or seek an in-person evaluation if the symptoms worsen or if the condition fails to improve as anticipated.    Lelon Frohlich, MD  [facility]

## 2019-06-12 ENCOUNTER — Telehealth: Payer: Self-pay | Admitting: Internal Medicine

## 2019-06-12 MED ORDER — CYCLOBENZAPRINE HCL 5 MG PO TABS
5.0000 mg | ORAL_TABLET | Freq: Two times a day (BID) | ORAL | 0 refills | Status: DC
Start: 2019-06-12 — End: 2019-09-04

## 2019-06-12 NOTE — Telephone Encounter (Signed)
Rx sent and patient is aware. 

## 2019-06-12 NOTE — Telephone Encounter (Signed)
Ok to send flexeril 5 mg to take BID as needed. #30 tabs. May cause drowsiness.

## 2019-06-12 NOTE — Telephone Encounter (Signed)
Pt would like to know if Dr. Ardyth Harps could prescribe her some muscle relaxers. Her back has her in a lot of pain and aleve is not helping.

## 2019-06-13 ENCOUNTER — Telehealth: Payer: Self-pay | Admitting: Physical Therapy

## 2019-06-13 NOTE — Telephone Encounter (Signed)
PT returned call to patient to answer questions about sleeping positions to alleviate neck and shoulder pain.  Russella Dar, PT 06/13/19 12:02 PM

## 2019-06-18 ENCOUNTER — Encounter: Payer: Self-pay | Admitting: Physical Therapy

## 2019-06-18 ENCOUNTER — Ambulatory Visit: Payer: BC Managed Care – PPO | Attending: Internal Medicine | Admitting: Physical Therapy

## 2019-06-18 ENCOUNTER — Other Ambulatory Visit: Payer: Self-pay

## 2019-06-18 DIAGNOSIS — R252 Cramp and spasm: Secondary | ICD-10-CM | POA: Diagnosis not present

## 2019-06-18 DIAGNOSIS — M6281 Muscle weakness (generalized): Secondary | ICD-10-CM | POA: Diagnosis not present

## 2019-06-18 DIAGNOSIS — M545 Low back pain, unspecified: Secondary | ICD-10-CM

## 2019-06-18 NOTE — Patient Instructions (Signed)
Access Code: [email or web address] URL: [email or web address] Date: 06/18/2019 Prepared by: Eulis Foster  Exercises TL Sidebending Stretch - Arms Overhead - 1 x daily - 7 x weekly - 1 sets - 3 reps - 10-30 hold Half Kneeling Hip Flexor Stretch - 1 x daily - 7 x weekly - 1 sets - 3 reps - 10-30 hold Quadriceps Stretch with Chair - 1 x daily - 7 x weekly - 1 sets - 3 reps - 10-30 hold Seated Trunk Rotation - Arms Crossed - 1 x daily - 7 x weekly - 1 sets - 10 reps - 2-3 hold Seated Thoracic Flexion and Rotation with Swiss Ball - 1 x daily - 7 x weekly - 1 sets - 5 reps - 5-10 hold Thomas Stretch on Table - 1 x daily - 7 x weekly - 1 sets - 1 reps - 20-30 hold Shoulder extension with resistance - Neutral - 1 x daily - 7 x weekly - 2 sets - 10 reps - 5 hold Dead Bug Alternating Arm Extension - 1 x daily - 7 x weekly - 1 sets - 10 reps Supine Chest Stretch on Foam Roll - 1 x daily - 7 x weekly - 1 sets - 1 reps - 30 sec hold [facility] [address], [address] [place], [place] [address] Phone # [phone] Fax [phone]

## 2019-06-18 NOTE — Therapy (Signed)
[facility] [address] [place], [place], [address] Phone: [phone]   Fax:  [phone]  Physical Therapy Treatment  Patient Details  Name: Elizabeth Lozano MRN: [id number] Date of Birth: 07-14-1970 Referring Provider (PT): Dr. Rayford Halsted. Isaac Bliss   Encounter Date: 06/18/2019  PT End of Session - 06/18/19 1415    Visit Number  4    Date for PT Re-Evaluation  08/22/19    Authorization Type  BCBS    PT Start Time  1400    PT Stop Time  1440    PT Time Calculation (min)  40 min    Activity Tolerance  Patient tolerated treatment well    Behavior During Therapy  WFL for tasks assessed/performed       Past Medical History:  Diagnosis Date  . Allergy    she has seen Dr. Tiajuana Amass   . Anemia    long term issues per pt  . Anxiety    due to back pain   . Asthma   . Depression   . Heart murmur   . History of stress test    ETT-echo (9/14):  ST depression noted on ECGs; no wall motion abnormalities at peak exercise on echocardiogram  . VSD (ventricular septal defect)    Cardiac CTA 05/2008: Membranous VSD, 8-9 mm in diameter, mild RVE, EF 64%, calcium score 0, no CAD. Last echo 02/2011: EF 55-60%, mild CAD, perimembranous VSD slightly more prominent but no RVE or pulmonary hypertension;  Echo (9/14): Small perimembranous VSD-no significant change since 02/2011; EF 55-60%, moderate LAE    Past Surgical History:  Procedure Laterality Date  . cyst left wrist    . dental implants    . WISDOM TOOTH EXTRACTION      There were no vitals filed for this visit.  Subjective Assessment - 06/18/19 1403    Subjective  I not have trouble to sleeping on my side. My neck and shoulders hurt. Bend neck to the right feel a muscle in left shoulder blader.    How long can you sit comfortably?  10 minutes    How long can you stand comfortably?  1.5 hours, used to do 12 hours    Patient Stated Goals  standing, sitting, return to daily  tasks    Currently in Pain?  Yes    Pain Score  2     Pain Location  Hip    Pain Orientation  Left;Lateral;Upper;Posterior    Pain Descriptors / Indicators  Tightness    Pain Type  Acute pain    Pain Onset  More than a month ago    Pain Frequency  Constant    Aggravating Factors   sit, stand, walk, movement    Pain Relieving Factors  ice    Multiple Pain Sites  No                       [facility] Adult PT Treatment/Exercise - 06/18/19 0001      Therapeutic Activites    Therapeutic Activities  Other Therapeutic Activities    Other Therapeutic Activities  educated patien ton how to use pillows to supoort her body while layin gon her back and side       Lumbar Exercises: Stretches   Other Lumbar Stretch Exercise  lay on foam roll along spine and do a pectoralis stretch, the alternate shoulder flexion      Lumbar Exercises: Standing   Other  Standing Lumbar Exercises  work on hip hinge with back against the wall and  and holding onto the door jam and hinge with tactild cues to not flex at the spine      Manual Therapy   Manual Therapy  Soft tissue mobilization;Joint mobilization    Joint Mobilization  PA and rotational mobilization to T3-L5, left posterior rib mobilization with breath to reduce the left rib hump    Soft tissue mobilization  thoracic and lumbar paraspinals, left quadratus, interscapular area, between the left posterior rib cage intercoastaols       Trigger Point Dry Needling - 06/18/19 0001    Consent Given?  Yes    Education Handout Provided  Previously provided    Muscles Treated Head and Neck  Upper trapezius   left   Muscles Treated Back/Hip  Thoracic multifidi   left T5-T8   Upper Trapezius Response  Twitch reponse elicited;Palpable increased muscle length    Thoracic multifidi response  Twitch response elicited;Palpable increased muscle length           PT Education - 06/18/19 1444    Education Details  Access Code: [email or web address]; ways to sleep  with her pillow    Person(s) Educated  Patient    Methods  Explanation;Demonstration;Verbal cues;Handout    Comprehension  Returned demonstration;Verbalized understanding       PT Short Term Goals - 06/18/19 1415      PT SHORT TERM GOAL #1   Title  be independent with initial HEP    Baseline  initiated today    Time  4    Period  Weeks    Status  Achieved      PT SHORT TERM GOAL #2   Title  report a 30% reduction in Lt hip and back pain with sitting and standing    Baseline  having flare up for the upper back that is pulling on the lower back    Time  4    Period  Weeks    Status  On-going    Target Date  06/27/19        PT Long Term Goals - 05/30/19 1109      PT LONG TERM GOAL #1   Title  be independent in advanced HEP    Time  12    Period  Weeks    Status  New    Target Date  08/22/19      PT LONG TERM GOAL #2   Title  reduce FOTO to < or = to 33% limitation    Time  12    Period  Weeks    Status  New    Target Date  08/22/19      PT LONG TERM GOAL #3   Title  report a 60% reduction in Lt hip and back pain with sitting and standing    Time  12    Period  Weeks    Status  New    Target Date  08/22/19      PT LONG TERM GOAL #4   Title  able to perfrom her usual daily activities with pain decreased >/= 75%    Time  12    Period  Weeks    Status  New    Target Date  08/22/19      PT LONG TERM GOAL #5   Title  able to go up and down stairs with >/= 75% reduction in pain due to improve strength of her hips  and core    Time  12    Period  Weeks    Status  New    Target Date  08/22/19            Plan - 06/18/19 1439    Clinical Impression Statement  Patient was holding her shoulders up putting strain on her low back and making it difficult for her to sleep. Patient understands ways she can use pillows and towels to support her back, head and legs to reduce strain on the back. Patient stands with her shoulder elevated, rounded shoulders, rib hump on  the left and reduction of lumbar lordosis putting strain on the lumbar and thoracic area. Patient has weakness interscapular area putting strain on the lumbar. Patient has trouble with hip hinging to flex at her hips for lifting and will flex at her waist. Patient will benefit from ongoing assessment of reponse to treatment to reduce pain and strengthen her back.    Examination-Activity Limitations  Sit;Stand;Lift;Squat;Carry;Locomotion Level    Examination-Participation Restrictions  Cleaning;Driving;Shop;Laundry;Community Activity    Stability/Clinical Decision Making  Evolving/Moderate complexity    Rehab Potential  Excellent    PT Frequency  2x / week    PT Duration  12 weeks    PT Treatment/Interventions  Cryotherapy;Electrical Stimulation;Iontophoresis 4mg /ml Dexamethasone;Moist Heat;Traction;Ultrasound;Neuromuscular re-education;Therapeutic exercise;Therapeutic activities;Patient/family education;Manual techniques;Dry needling;Taping;Spinal Manipulations;Joint Manipulations    PT Next Visit Plan  work on serratus anterior andlower trapezius; work on scapula retraction with core bracing, progress body mechanics and work on squatting    PT Home Exercise Plan  Access Code: [email or web address]    Recommended Other Services  MD signed initial evaluation    Consulted and Agree with Plan of Care  Patient       Patient will benefit from skilled therapeutic intervention in order to improve the following deficits and impairments:  Decreased range of motion, Increased fascial restricitons, Decreased endurance, Decreased activity tolerance, Pain, Decreased mobility, Decreased strength  Visit Diagnosis: Muscle weakness (generalized)  Cramp and spasm  Acute bilateral low back pain without sciatica     Problem List Patient Active Problem List   Diagnosis Date Noted  . 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 Hypertension 05/03/2016  . Anemia due to other cause 03/24/2014  . Routine general medical  examination at a health care facility 03/24/2014  . VSD (ventricular septal defect) 03/09/2011  . URTICARIA 06/03/2008  . SEBACEOUS CYST 10/09/2007  . Asthma 10/31/2006    11/02/2006, PT 06/18/19 2:53 PM   [facility] [address] [place], [place], [place] Phone: [phone]   Fax:  [phone]  Name: Elizabeth Lozano MRN: [name] Date of Birth: [birth year]

## 2019-06-20 ENCOUNTER — Ambulatory Visit: Payer: BC Managed Care – PPO | Admitting: Physical Therapy

## 2019-06-20 ENCOUNTER — Other Ambulatory Visit: Payer: Self-pay

## 2019-06-20 ENCOUNTER — Encounter: Payer: Self-pay | Admitting: Physical Therapy

## 2019-06-20 DIAGNOSIS — R252 Cramp and spasm: Secondary | ICD-10-CM

## 2019-06-20 DIAGNOSIS — M545 Low back pain, unspecified: Secondary | ICD-10-CM

## 2019-06-20 DIAGNOSIS — M6281 Muscle weakness (generalized): Secondary | ICD-10-CM

## 2019-06-20 NOTE — Patient Instructions (Signed)
Access Code: [email or web address] URL: [email or web address] Date: 06/20/2019 Prepared by: Raynelle Fanning Madyx Delfin  Exercises TL Sidebending Stretch - Arms Overhead - 1 x daily - 7 x weekly - 1 sets - 3 reps - 10-30 hold Half Kneeling Hip Flexor Stretch - 1 x daily - 7 x weekly - 1 sets - 3 reps - 10-30 hold Quadriceps Stretch with Chair - 1 x daily - 7 x weekly - 1 sets - 3 reps - 10-30 hold Seated Trunk Rotation - Arms Crossed - 1 x daily - 7 x weekly - 1 sets - 10 reps - 2-3 hold Seated Thoracic Flexion and Rotation with Swiss Ball - 1 x daily - 7 x weekly - 1 sets - 5 reps - 5-10 hold Thomas Stretch on Table - 1 x daily - 7 x weekly - 1 sets - 1 reps - 20-30 hold Shoulder extension with resistance - Neutral - 1 x daily - 7 x weekly - 2 sets - 10 reps - 5 hold Dead Bug Alternating Arm Extension - 1 x daily - 7 x weekly - 1 sets - 10 reps Supine Chest Stretch on Foam Roll - 1 x daily - 7 x weekly - 1 sets - 1 reps - 30 sec hold Mini Squat/Good Morning - 1 x daily - 7 x weekly - 3 sets - 10 reps

## 2019-06-20 NOTE — Therapy (Signed)
[facility] [address] [place], [place], [address] Phone: [phone]   Fax:  [phone]  Physical Therapy Treatment  Patient Details  Name: Elizabeth Lozano MRN: [id number] Date of Birth: 12-Mar-1970 Referring Provider (PT): Dr. Limmie Patricia. Philip Aspen   Encounter Date: 06/20/2019  PT End of Session - 06/20/19 0848    Visit Number  5    Date for PT Re-Evaluation  08/22/19    Authorization Type  BCBS    PT Start Time  0848    PT Stop Time  0930    PT Time Calculation (min)  42 min    Activity Tolerance  Patient tolerated treatment well    Behavior During Therapy  [facility] for tasks assessed/performed       Past Medical History:  Diagnosis Date  . Allergy    she has seen Dr. Eileen Stanford   . Anemia    long term issues per pt  . Anxiety    due to back pain   . Asthma   . Depression   . Heart murmur   . History of stress test    ETT-echo (9/14):  ST depression noted on ECGs; no wall motion abnormalities at peak exercise on echocardiogram  . VSD (ventricular septal defect)    Cardiac CTA 05/2008: Membranous VSD, 8-9 mm in diameter, mild RVE, EF 64%, calcium score 0, no CAD. Last echo 02/2011: EF 55-60%, mild CAD, perimembranous VSD slightly more prominent but no RVE or pulmonary hypertension;  Echo (9/14): Small perimembranous VSD-no significant change since 02/2011; EF 55-60%, moderate LAE    Past Surgical History:  Procedure Laterality Date  . cyst left wrist    . dental implants    . WISDOM TOOTH EXTRACTION      There were no vitals filed for this visit.  Subjective Assessment - 06/20/19 0848    Subjective  Had a good day yesterday and then as soon as I lied down the left low back just tighened back up.    How long can you sit comfortably?  10 minutes    How long can you stand comfortably?  1.5 hours, used to do 12 hours    Patient Stated Goals  standing, sitting, return to daily tasks    Currently in Pain?  Yes     Pain Score  5     Pain Location  Hip    Pain Descriptors / Indicators  Tightness    Pain Type  Acute pain                       [facility] Adult PT Treatment/Exercise - 06/20/19 0001      Self-Care   Self-Care  Other Self-Care Comments    Other Self-Care Comments   discussion of patient's fear of performing strengthening after pain has reduced and the need to gradually introduce strengthening to prevent return of pain; also that there is a difference between hurt and harm as a response to TE      Exercises   Exercises  Shoulder      Lumbar Exercises: Standing   Shoulder Extension  Strengthening;15 reps;Theraband    Theraband Level (Shoulder Extension)  Level 1 (Yellow)    Shoulder Extension Limitations  pt reluctant to use red so we decreased to yellow; cues for core activation    Other Standing Lumbar Exercises  bil protraction yellow band x 15 cues of core activation  Shoulder Exercises: Supine   Protraction  Left;15 reps    Protraction Weight (lbs)  3    Protraction Limitations  first 5 with no wt; tactile cue for elbow ext      Shoulder Exercises: Prone   Extension  Left;10 reps    Horizontal ABduction 1  Left;10 reps      Shoulder Exercises: Stretch   Other Shoulder Stretches  upper back stretch in server position 5 x 10 sec      Manual Therapy   Manual Therapy  Soft tissue mobilization;Joint mobilization    Joint Mobilization  gentle rib springing left    Soft tissue mobilization  to left lumbar, QL, left post diaphragm and left intercostals             PT Education - 06/20/19 1726    Education Details  HEP progressed    Person(s) Educated  Patient    Methods  Explanation;Demonstration;Handout    Comprehension  Verbalized understanding;Returned demonstration       PT Short Term Goals - 06/18/19 1415      PT SHORT TERM GOAL #1   Title  be independent with initial HEP    Baseline  initiated today    Time  4    Period  Weeks    Status   Achieved      PT SHORT TERM GOAL #2   Title  report a 30% reduction in Lt hip and back pain with sitting and standing    Baseline  having flare up for the upper back that is pulling on the lower back    Time  4    Period  Weeks    Status  On-going    Target Date  06/27/19        PT Long Term Goals - 05/30/19 1109      PT LONG TERM GOAL #1   Title  be independent in advanced HEP    Time  12    Period  Weeks    Status  New    Target Date  08/22/19      PT LONG TERM GOAL #2   Title  reduce FOTO to < or = to 33% limitation    Time  12    Period  Weeks    Status  New    Target Date  08/22/19      PT LONG TERM GOAL #3   Title  report a 60% reduction in Lt hip and back pain with sitting and standing    Time  12    Period  Weeks    Status  New    Target Date  08/22/19      PT LONG TERM GOAL #4   Title  able to perfrom her usual daily activities with pain decreased >/= 75%    Time  12    Period  Weeks    Status  New    Target Date  08/22/19      PT LONG TERM GOAL #5   Title  able to go up and down stairs with >/= 75% reduction in pain due to improve strength of her hips and core    Time  12    Period  Weeks    Status  New    Target Date  08/22/19            Plan - 06/20/19 1726    Clinical Impression Statement  Patient reporting increased spasm in left low back  this morning. She was reluctant to work on upper back and scapular strengthening for fear of flaring up pain again in this area, but she did well after encouragement. She responded well to STW in left QL and lumbar area. She had some point tenderness in left intercostals as well.    PT Treatment/Interventions  Cryotherapy;Electrical Stimulation;Iontophoresis 4mg /ml Dexamethasone;Moist Heat;Traction;Ultrasound;Neuromuscular re-education;Therapeutic exercise;Therapeutic activities;Patient/family education;Manual techniques;Dry needling;Taping;Spinal Manipulations;Joint Manipulations    PT Next Visit Plan  work  on serratus anterior andlower trapezius; work on scapula retraction with core bracing, progress body mechanics and work on squatting       Patient will benefit from skilled therapeutic intervention in order to improve the following deficits and impairments:  Decreased range of motion, Increased fascial restricitons, Decreased endurance, Decreased activity tolerance, Pain, Decreased mobility, Decreased strength  Visit Diagnosis: Muscle weakness (generalized)  Cramp and spasm  Acute bilateral low back pain without sciatica     Problem List Patient Active Problem List   Diagnosis Date Noted  . 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 Hypertension 05/03/2016  . Anemia due to other cause 03/24/2014  . Routine general medical examination at a health care facility 03/24/2014  . VSD (ventricular septal defect) 03/09/2011  . URTICARIA 06/03/2008  . SEBACEOUS CYST 10/09/2007  . Asthma 10/31/2006    11/02/2006 PT 06/20/2019, 5:39 PM  [facility] [address], [address] [place], [place], [place] Phone: [phone]   Fax:  [phone]  Name: Elizabeth Lozano MRN: [name] Date of Birth: [birth year]

## 2019-06-24 ENCOUNTER — Encounter: Payer: BC Managed Care – PPO | Admitting: Gastroenterology

## 2019-06-24 ENCOUNTER — Encounter: Payer: Self-pay | Admitting: Internal Medicine

## 2019-06-24 DIAGNOSIS — M545 Low back pain: Secondary | ICD-10-CM

## 2019-06-24 DIAGNOSIS — G8929 Other chronic pain: Secondary | ICD-10-CM

## 2019-06-25 ENCOUNTER — Ambulatory Visit: Payer: BC Managed Care – PPO | Admitting: Physical Therapy

## 2019-06-25 ENCOUNTER — Other Ambulatory Visit: Payer: Self-pay

## 2019-06-25 DIAGNOSIS — M545 Low back pain, unspecified: Secondary | ICD-10-CM

## 2019-06-25 DIAGNOSIS — R252 Cramp and spasm: Secondary | ICD-10-CM | POA: Diagnosis not present

## 2019-06-25 DIAGNOSIS — M6281 Muscle weakness (generalized): Secondary | ICD-10-CM

## 2019-06-25 NOTE — Therapy (Signed)
[facility] [address] [place], [place], [address] Phone: [phone]   Fax:  [phone]  Physical Therapy Treatment  Patient Details  Name: Elizabeth Lozano MRN: [id number] Date of Birth: 06-23-1970 Referring Provider (PT): Dr. Rayford Halsted. Isaac Bliss   Encounter Date: 06/25/2019  PT End of Session - 06/25/19 0850    Visit Number  6    Date for PT Re-Evaluation  08/22/19    Authorization Type  BCBS    PT Start Time  0850    PT Stop Time  0931    PT Time Calculation (min)  41 min    Activity Tolerance  Patient tolerated treatment well    Behavior During Therapy  [facility] for tasks assessed/performed       Past Medical History:  Diagnosis Date  . Allergy    she has seen Dr. Tiajuana Amass   . Anemia    long term issues per pt  . Anxiety    due to back pain   . Asthma   . Depression   . Heart murmur   . History of stress test    ETT-echo (9/14):  ST depression noted on ECGs; no wall motion abnormalities at peak exercise on echocardiogram  . VSD (ventricular septal defect)    Cardiac CTA 05/2008: Membranous VSD, 8-9 mm in diameter, mild RVE, EF 64%, calcium score 0, no CAD. Last echo 02/2011: EF 55-60%, mild CAD, perimembranous VSD slightly more prominent but no RVE or pulmonary hypertension;  Echo (9/14): Small perimembranous VSD-no significant change since 02/2011; EF 55-60%, moderate LAE    Past Surgical History:  Procedure Laterality Date  . cyst left wrist    . dental implants    . WISDOM TOOTH EXTRACTION      There were no vitals filed for this visit.  Subjective Assessment - 06/25/19 0850    Subjective  Back is always a little worse after a car ride but doing okay. My hips a little tight.    Patient Stated Goals  standing, sitting, return to daily tasks    Currently in Pain?  Yes    Pain Score  2     Pain Location  Back    Pain Orientation  Left                       [facility] Adult PT  Treatment/Exercise - 06/25/19 0001      Lumbar Exercises: Stretches   Other Lumbar Stretch Exercise  seated cross legged  side bending x 3 ea then with roration x 3 each      Lumbar Exercises: Aerobic   Elliptical  R1 Inc 5 x 4 min      Lumbar Exercises: Standing   Row  Strengthening;Both;15 reps    Theraband Level (Row)  Level 2 (Red)    Shoulder Extension  Strengthening;15 reps;Theraband    Theraband Level (Shoulder Extension)  Level 2 (Red)    Other Standing Lumbar Exercises  bil protraction red band x 15 cues of core activation      Shoulder Exercises: Prone   Extension  Left;10 reps    Extension Weight (lbs)  1    Horizontal ABduction 1  Left;10 reps;Weights    Horizontal ABduction 1 Weight (lbs)  1      Shoulder Exercises: ROM/Strengthening   Lat Pull  20 reps    Lat Pull Limitations  25#    Pushups  10 reps  Pushups Limitations  incline on counter               PT Short Term Goals - 06/25/19 0853      PT SHORT TERM GOAL #2   Title  report a 30% reduction in Lt hip and back pain with sitting and standing    Baseline  up to 30 min standing tolerated. Still does not sit.    Status  Partially Met        PT Long Term Goals - 06/25/19 0856      PT LONG TERM GOAL #1   Title  be independent in advanced HEP    Status  On-going      PT LONG TERM GOAL #3   Title  report a 60% reduction in Lt hip and back pain with sitting and standing    Baseline  > 50% reduction in standing    Status  On-going      PT LONG TERM GOAL #4   Title  able to perfrom her usual daily activities with pain decreased >/= 75%      PT LONG TERM GOAL #5   Title  able to go up and down stairs with >/= 75% reduction in pain due to improve strength of her hips and core    Baseline  patient reports she does not climb stairs much            Plan - 06/25/19 0902    Clinical Impression Statement  Patient reporting improvement overall and that she is going in the right direction. Low  back pain is not constant anymore; the shoulders are more constant. She was able to tolerate increased resistance today and increased sitting time with TE.    Examination-Activity Limitations  Sit;Stand;Lift;Squat;Carry;Locomotion Level    Examination-Participation Restrictions  Cleaning;Driving;Shop;Laundry;Community Activity    PT Frequency  --    PT Duration  --    PT Treatment/Interventions  Cryotherapy;Electrical Stimulation;Iontophoresis 33m/ml Dexamethasone;Moist Heat;Traction;Ultrasound;Neuromuscular re-education;Therapeutic exercise;Therapeutic activities;Patient/family education;Manual techniques;Dry needling;Taping;Spinal Manipulations;Joint Manipulations    PT Next Visit Plan  work on serratus anterior andlower trapezius; work on scapula retraction with core bracing, progress body mechanics and work on squatting and sitting tolerance    PT Home Exercise Plan  Access Code: [email or web address]       Patient will benefit from skilled therapeutic intervention in order to improve the following deficits and impairments:  Decreased range of motion, Increased fascial restricitons, Decreased endurance, Decreased activity tolerance, Pain, Decreased mobility, Decreased strength  Visit Diagnosis: Muscle weakness (generalized)  Cramp and spasm  Acute bilateral low back pain without sciatica     Problem List Patient Active Problem List   Diagnosis Date Noted  . 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 Hypertension 05/03/2016  . Anemia due to other cause 03/24/2014  . Routine general medical examination at a health care facility 03/24/2014  . VSD (ventricular septal defect) 03/09/2011  . URTICARIA 06/03/2008  . SEBACEOUS CYST 10/09/2007  . Asthma 10/31/2006    [name] 06/25/2019, 9:23 PM  [facility] [address] [place] [address]Phone: [phone]  Fax:  [phone] Name: Elizabeth TremainMRN: [id number]  of Birth: [birth year]

## 2019-06-27 ENCOUNTER — Other Ambulatory Visit: Payer: Self-pay

## 2019-06-27 ENCOUNTER — Ambulatory Visit: Payer: BC Managed Care – PPO | Admitting: Physical Therapy

## 2019-06-27 ENCOUNTER — Encounter: Payer: Self-pay | Admitting: Physical Therapy

## 2019-06-27 DIAGNOSIS — M6281 Muscle weakness (generalized): Secondary | ICD-10-CM

## 2019-06-27 DIAGNOSIS — M545 Low back pain, unspecified: Secondary | ICD-10-CM

## 2019-06-27 DIAGNOSIS — R252 Cramp and spasm: Secondary | ICD-10-CM | POA: Diagnosis not present

## 2019-06-27 NOTE — Patient Instructions (Signed)
Access Code: [email or web address] URL: [email or web address] Date: 06/20/2019 Prepared by: Raynelle Fanning Arrin Pintor  Exercises TL Sidebending Stretch - Arms Overhead - 1 x daily - 7 x weekly - 1 sets - 3 reps - 10-30 hold Half Kneeling Hip Flexor Stretch - 1 x daily - 7 x weekly - 1 sets - 3 reps - 10-30 hold Quadriceps Stretch with Chair - 1 x daily - 7 x weekly - 1 sets - 3 reps - 10-30 hold Seated Trunk Rotation - Arms Crossed - 1 x daily - 7 x weekly - 1 sets - 10 reps - 2-3 hold Seated Thoracic Flexion and Rotation with Swiss Ball - 1 x daily - 7 x weekly - 1 sets - 5 reps - 5-10 hold Thomas Stretch on Table - 1 x daily - 7 x weekly - 1 sets - 1 reps - 20-30 hold Shoulder extension with resistance - Neutral - 1 x daily - 7 x weekly - 2 sets - 10 reps - 5 hold Dead Bug Alternating Arm Extension - 1 x daily - 7 x weekly - 1 sets - 10 reps Supine Chest Stretch on Foam Roll - 1 x daily - 7 x weekly - 1 sets - 1 reps - 30 sec hold Mini Squat/Good Morning - 1 x daily - 7 x weekly - 3 sets - 10 reps Prone Horizontal Abduction with Palms Down - 1 x daily - 7 x weekly - 10 reps - 3 sets Prone Scapular Retraction Y - 1 x daily - 7 x weekly - 10 reps - 3 sets Standing Row with Anchored Resistance - 1 x daily - 7 x weekly - 10 reps - 3 sets Shoulder Extension with Resistance - 1 x daily - 7 x weekly - 10 reps - 3 sets

## 2019-06-27 NOTE — Therapy (Signed)
[facility] [address] [place], [place], [address] Phone: [phone]   Fax:  [phone]  Physical Therapy Treatment  Patient Details  Name: Elizabeth Lozano MRN: [id number] Date of Birth: 08/23/1970 Referring Provider (PT): Dr. Rayford Halsted. Isaac Bliss   Encounter Date: 06/27/2019  PT End of Session - 06/27/19 1359    Visit Number  7    Date for PT Re-Evaluation  08/22/19    Authorization Type  BCBS    PT Start Time  1400    PT Stop Time  1444    PT Time Calculation (min)  44 min    Activity Tolerance  Patient tolerated treatment well    Behavior During Therapy  WFL for tasks assessed/performed       Past Medical History:  Diagnosis Date  . Allergy    she has seen Dr. Tiajuana Amass   . Anemia    long term issues per pt  . Anxiety    due to back pain   . Asthma   . Depression   . Heart murmur   . History of stress test    ETT-echo (9/14):  ST depression noted on ECGs; no wall motion abnormalities at peak exercise on echocardiogram  . VSD (ventricular septal defect)    Cardiac CTA 05/2008: Membranous VSD, 8-9 mm in diameter, mild RVE, EF 64%, calcium score 0, no CAD. Last echo 02/2011: EF 55-60%, mild CAD, perimembranous VSD slightly more prominent but no RVE or pulmonary hypertension;  Echo (9/14): Small perimembranous VSD-no significant change since 02/2011; EF 55-60%, moderate LAE    Past Surgical History:  Procedure Laterality Date  . cyst left wrist    . dental implants    . WISDOM TOOTH EXTRACTION      There were no vitals filed for this visit.  Subjective Assessment - 06/27/19 1400    Subjective  My shoulder is feeling really good. I slept without any pain last night. My back tightened up about 24 hours ago.    Patient Stated Goals  standing, sitting, return to daily tasks    Currently in Pain?  Yes    Pain Score  1     Pain Location  Back    Pain Orientation  Left    Pain Descriptors / Indicators   Tightness                        [facility] Adult PT Treatment/Exercise - 06/27/19 0001      Self-Care   Self-Care  Other Self-Care Comments    Other Self-Care Comments   MFR with roller in wall squat position      Lumbar Exercises: Stretches   Other Lumbar Stretch Exercise  seated cross legged  side bending x 3 ea then with roration x 3 each      Lumbar Exercises: Aerobic   Elliptical  R1 Inc 5 x 4 min      Lumbar Exercises: Standing   Functional Squats  5 reps    Functional Squats Limitations  also mini squat with bil OH flexion 2x10 sec hold then drop arms behind chest up 2x10 sec hold    Row  Strengthening;Both;15 reps    Theraband Level (Row)  Level 2 (Red)    Row Limitations  then 10 each standing on one leg with core activation    Shoulder Extension  Strengthening;20 reps    Theraband Level (Shoulder Extension)  Level 2 (Red)  Shoulder Extension Limitations  then with staggered stance x 10 ea    Other Standing Lumbar Exercises  dead lifts x 5 no wt after 5 using dowel for form; x 5 with 5# wts    Other Standing Lumbar Exercises  bil protraction red band x 15 cues of core activation      Shoulder Exercises: Prone   Extension  Both;20 reps    Extension Weight (lbs)  1    Horizontal ABduction 1  Both;20 reps;10 reps    Horizontal ABduction 1 Weight (lbs)  1    Horizontal ABduction 2  Strengthening;Both;20 reps;Weights    Horizontal ABduction 2 Weight (lbs)  1      Shoulder Exercises: ROM/Strengthening   Lat Pull  20 reps    Lat Pull Limitations  25#             PT Education - 06/27/19 1552    Education Details  HEP progressed    Person(s) Educated  Patient    Methods  Explanation;Demonstration;Handout    Comprehension  Verbalized understanding;Returned demonstration       PT Short Term Goals - 06/25/19 0853      PT SHORT TERM GOAL #2   Title  report a 30% reduction in Lt hip and back pain with sitting and standing    Baseline  up to 30 min  standing tolerated. Still does not sit.    Status  Partially Met        PT Long Term Goals - 06/25/19 0856      PT LONG TERM GOAL #1   Title  be independent in advanced HEP    Status  On-going      PT LONG TERM GOAL #3   Title  report a 60% reduction in Lt hip and back pain with sitting and standing    Baseline  > 50% reduction in standing    Status  On-going      PT LONG TERM GOAL #4   Title  able to perfrom her usual daily activities with pain decreased >/= 75%      PT LONG TERM GOAL #5   Title  able to go up and down stairs with >/= 75% reduction in pain due to improve strength of her hips and core    Baseline  patient reports she does not climb stairs much            Plan - 06/27/19 1546    Clinical Impression Statement  Patient continues to progress with pain control and function. She is comfortable now adding increased resistance and exercises without fear of flaring up her back and shoulders and stated that her back actually feels better with the strengthening. She did very well functional squats and deadlifts demonstrating good form with and without dowel. Patient requests that exercises be added to HEP each visit, so she made need assistance breaking up program into reasonable daily routines. She would do well with a circuit routine in the future.    PT Frequency  2x / week    PT Duration  12 weeks    PT Treatment/Interventions  Cryotherapy;Electrical Stimulation;Iontophoresis 38m/ml Dexamethasone;Moist Heat;Traction;Ultrasound;Neuromuscular re-education;Therapeutic exercise;Therapeutic activities;Patient/family education;Manual techniques;Dry needling;Taping;Spinal Manipulations;Joint Manipulations    PT Next Visit Plan  Try working pt into a circuit for upper and lower back strengthening.    PT Home Exercise Plan  Access Code: [email or web address]    Consulted and Agree with Plan of Care  Patient       Patient  will benefit from skilled therapeutic intervention in order to  improve the following deficits and impairments:  Decreased range of motion, Increased fascial restricitons, Decreased endurance, Decreased activity tolerance, Pain, Decreased mobility, Decreased strength  Visit Diagnosis: Muscle weakness (generalized)  Cramp and spasm  Acute bilateral low back pain without sciatica     Problem List Patient Active Problem List   Diagnosis Date Noted  . 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 Hypertension 05/03/2016  . Anemia due to other cause 03/24/2014  . Routine general medical examination at a health care facility 03/24/2014  . VSD (ventricular septal defect) 03/09/2011  . URTICARIA 06/03/2008  . SEBACEOUS CYST 10/09/2007  . Asthma 10/31/2006   Madelyn Flavors PT 06/27/2019, 3:53 PM  [facility] [address] [place], [place], [address] Phone: [phone]   Fax:  [phone]  Name: Elizabeth Lozano MRN: [id number] Date of Birth: 1970/03/10

## 2019-07-01 ENCOUNTER — Ambulatory Visit: Payer: BC Managed Care – PPO

## 2019-07-01 ENCOUNTER — Other Ambulatory Visit: Payer: Self-pay

## 2019-07-01 DIAGNOSIS — M6281 Muscle weakness (generalized): Secondary | ICD-10-CM | POA: Diagnosis not present

## 2019-07-01 DIAGNOSIS — M545 Low back pain, unspecified: Secondary | ICD-10-CM

## 2019-07-01 DIAGNOSIS — R252 Cramp and spasm: Secondary | ICD-10-CM

## 2019-07-01 NOTE — Therapy (Signed)
[facility] [address] [place], [place], [address] Phone: [phone]   Fax:  [phone]  Physical Therapy Treatment  Patient Details  Name: Elizabeth Lozano MRN: [id number] Date of Birth: 1970/04/01 Referring Provider (PT): Dr. Rayford Halsted. Isaac Bliss   Encounter Date: 07/01/2019  PT End of Session - 07/01/19 0927    Visit Number  8    Date for PT Re-Evaluation  08/22/19    Authorization Type  BCBS    PT Start Time  0845    PT Stop Time  0929    PT Time Calculation (min)  44 min    Activity Tolerance  Patient tolerated treatment well    Behavior During Therapy  [facility] for tasks assessed/performed       Past Medical History:  Diagnosis Date  . Allergy    she has seen Dr. Tiajuana Amass   . Anemia    long term issues per pt  . Anxiety    due to back pain   . Asthma   . Depression   . Heart murmur   . History of stress test    ETT-echo (9/14):  ST depression noted on ECGs; no wall motion abnormalities at peak exercise on echocardiogram  . VSD (ventricular septal defect)    Cardiac CTA 05/2008: Membranous VSD, 8-9 mm in diameter, mild RVE, EF 64%, calcium score 0, no CAD. Last echo 02/2011: EF 55-60%, mild CAD, perimembranous VSD slightly more prominent but no RVE or pulmonary hypertension;  Echo (9/14): Small perimembranous VSD-no significant change since 02/2011; EF 55-60%, moderate LAE    Past Surgical History:  Procedure Laterality Date  . cyst left wrist    . dental implants    . WISDOM TOOTH EXTRACTION      There were no vitals filed for this visit.  Subjective Assessment - 07/01/19 0850    Subjective  I have been feeling more unsettled in my low back.    Patient Stated Goals  standing, sitting, return to daily tasks    Currently in Pain?  Yes    Pain Score  2     Pain Location  Back    Pain Orientation  Left    Pain Descriptors / Indicators  Tightness    Pain Type  Acute pain                         [facility] Adult PT Treatment/Exercise - 07/01/19 0001      Lumbar Exercises: Aerobic   Elliptical  R1 Inc 5 x 5 min      Lumbar Exercises: Standing   Functional Squats  10 reps    Functional Squats Limitations  5# added    Row  Strengthening;Both;15 reps    Theraband Level (Row)  Level 2 (Red)    Row Limitations  10 standing on each leg    Shoulder Extension  Strengthening;20 reps    Theraband Level (Shoulder Extension)  Level 2 (Red)    Shoulder Extension Limitations  staggered stance    Other Standing Lumbar Exercises  dead lifts x 5 no wt after 5 using dowel for form; x 5 with 5# wts      Knee/Hip Exercises: Sidelying   Other Sidelying Knee/Hip Exercises  open book x10      Shoulder Exercises: Supine   Horizontal ABduction  Strengthening;20 reps;Theraband    Theraband Level (Shoulder Horizontal ABduction)  Level 2 (Red)    External  Rotation  Strengthening;Both;20 reps    Theraband Level (Shoulder External Rotation)  Level 2 (Red)    External Rotation Limitations  on foam roll    Diagonals  Strengthening;Both;20 reps    Theraband Level (Shoulder Diagonals)  Level 2 (Red)    Diagonals Limitations  on foam roll             PT Education - 07/01/19 0926    Education Details  Access Code: [email or web address]    Person(s) Educated  Patient    Methods  Explanation;Demonstration    Comprehension  Verbalized understanding;Returned demonstration       PT Short Term Goals - 07/01/19 0857      PT SHORT TERM GOAL #1   Title  be independent with initial HEP    Baseline  independent    Status  Achieved      PT SHORT TERM GOAL #2   Title  report a 30% reduction in Lt hip and back pain with sitting and standing    Baseline  50%    Status  Achieved        PT Long Term Goals - 07/01/19 0858      PT LONG TERM GOAL #1   Title  be independent in advanced HEP    Time  12    Period  Weeks    Status  On-going      PT LONG TERM GOAL #3   Title   report a 60% reduction in Lt hip and back pain with sitting and standing    Baseline  50%    Time  12    Period  Weeks    Status  On-going            Plan - 07/01/19 0913    Clinical Impression Statement  Pt reports 50% overall improvement in lumbar/thoracic symptoms since the start of care.  Pt had increased LBP over the weekend and this has resolved today.  Patient continues to progress with pain control and function. She did very well functional squats and deadlifts demonstrating good form with and without dowel. Pt requires tactile and verbal cues for speed and technique.  Pt will continue to benefit from skilled PT to address strength, flexibility and alignment.    PT Frequency  2x / week    PT Duration  12 weeks    PT Treatment/Interventions  Cryotherapy;Electrical Stimulation;Iontophoresis 4mg /ml Dexamethasone;Moist Heat;Traction;Ultrasound;Neuromuscular re-education;Therapeutic exercise;Therapeutic activities;Patient/family education;Manual techniques;Dry needling;Taping;Spinal Manipulations;Joint Manipulations    PT Next Visit Plan  core strength, flexibility    PT Home Exercise Plan  Access Code: [email or web address]    Consulted and Agree with Plan of Care  Patient       Patient will benefit from skilled therapeutic intervention in order to improve the following deficits and impairments:  Decreased range of motion, Increased fascial restricitons, Decreased endurance, Decreased activity tolerance, Pain, Decreased mobility, Decreased strength  Visit Diagnosis: Muscle weakness (generalized)  Cramp and spasm  Acute bilateral low back pain without sciatica     Problem List Patient Active Problem List   Diagnosis Date Noted  . 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 Hypertension 05/03/2016  . Anemia due to other cause 03/24/2014  . Routine general medical examination at a health care facility 03/24/2014  . VSD (ventricular septal defect) 03/09/2011  . URTICARIA 06/03/2008  .  SEBACEOUS CYST 10/09/2007  . Asthma 10/31/2006    11/02/2006, PT 07/01/19 9:31 AM  [facility] [address] [place], [place], [address] Phone: [phone]   Fax:  [phone]  Name: Ghazal Pevey MRN: [id number] Date of Birth: Jun 27, 1970

## 2019-07-01 NOTE — Patient Instructions (Signed)
Access Code: [email or web address] URL: [email or web address] Date: 07/01/2019 Prepared by: Tresa Endo   Sidelying Open Book Thoracic Rotation with Knee on Foam Roll - 2 x daily - 7 x weekly - 1 sets - 10 reps Supine Shoulder Horizontal Abduction with Resistance - 2 x daily - 7 x weekly - 10 reps - 2 sets Supine Bilateral Shoulder External Rotation with Resistance - 2 x daily - 7 x weekly - 10 reps - 2 sets Supine PNF D2 Flexion with Resistance - 2 x daily - 7 x weekly - 10 reps - 2 sets

## 2019-07-03 ENCOUNTER — Other Ambulatory Visit: Payer: Self-pay

## 2019-07-03 ENCOUNTER — Ambulatory Visit: Payer: BC Managed Care – PPO

## 2019-07-03 DIAGNOSIS — R252 Cramp and spasm: Secondary | ICD-10-CM

## 2019-07-03 DIAGNOSIS — M6281 Muscle weakness (generalized): Secondary | ICD-10-CM | POA: Diagnosis not present

## 2019-07-03 DIAGNOSIS — Z6823 Body mass index (BMI) 23.0-23.9, adult: Secondary | ICD-10-CM | POA: Diagnosis not present

## 2019-07-03 DIAGNOSIS — Z3040 Encounter for surveillance of contraceptives, unspecified: Secondary | ICD-10-CM | POA: Diagnosis not present

## 2019-07-03 DIAGNOSIS — M545 Low back pain, unspecified: Secondary | ICD-10-CM

## 2019-07-03 DIAGNOSIS — Z01419 Encounter for gynecological examination (general) (routine) without abnormal findings: Secondary | ICD-10-CM | POA: Diagnosis not present

## 2019-07-03 NOTE — Therapy (Signed)
[facility] [address] [place], [place], [address] Phone: [phone]   Fax:  [phone]  Physical Therapy Treatment  Patient Details  Name: Elizabeth Lozano MRN: [id number] Date of Birth: 1970/10/14 Referring Provider (PT): Dr. Limmie Patricia. Philip Aspen   Encounter Date: 07/03/2019  PT End of Session - 07/03/19 0922    Visit Number  9    Date for PT Re-Evaluation  08/22/19    Authorization Type  BCBS    PT Start Time  0848    PT Stop Time  0927    PT Time Calculation (min)  39 min    Activity Tolerance  Patient tolerated treatment well    Behavior During Therapy  [facility] for tasks assessed/performed       Past Medical History:  Diagnosis Date  . Allergy    she has seen Dr. Eileen Stanford   . Anemia    long term issues per pt  . Anxiety    due to back pain   . Asthma   . Depression   . Heart murmur   . History of stress test    ETT-echo (9/14):  ST depression noted on ECGs; no wall motion abnormalities at peak exercise on echocardiogram  . VSD (ventricular septal defect)    Cardiac CTA 05/2008: Membranous VSD, 8-9 mm in diameter, mild RVE, EF 64%, calcium score 0, no CAD. Last echo 02/2011: EF 55-60%, mild CAD, perimembranous VSD slightly more prominent but no RVE or pulmonary hypertension;  Echo (9/14): Small perimembranous VSD-no significant change since 02/2011; EF 55-60%, moderate LAE    Past Surgical History:  Procedure Laterality Date  . cyst left wrist    . dental implants    . WISDOM TOOTH EXTRACTION      There were no vitals filed for this visit.  Subjective Assessment - 07/03/19 0848    Subjective  I'm OK this morning.  My back is tight but otherwise doing well.    Currently in Pain?  Yes    Pain Score  1     Pain Location  Back    Pain Orientation  Left    Pain Descriptors / Indicators  Tightness    Pain Type  Acute pain    Pain Onset  More than a month ago    Pain Frequency  Constant                         [facility] Adult PT Treatment/Exercise - 07/03/19 0001      Lumbar Exercises: Aerobic   Elliptical  R4 Inc 5 x 5 min    UBE (Upper Arm Bike)  Level 1x 6 minutes (3/3)      Lumbar Exercises: Standing   Row  Strengthening;Both;15 reps    Theraband Level (Row)  Level 2 (Red)    Shoulder Extension  Strengthening;20 reps    Theraband Level (Shoulder Extension)  Level 2 (Red)    Shoulder Extension Limitations  staggered stance    Other Standing Lumbar Exercises  standing on black pad- yellow ball diagonals with core activation      Shoulder Exercises: Supine   Horizontal ABduction  Strengthening;20 reps;Theraband    Theraband Level (Shoulder Horizontal ABduction)  Level 2 (Red)    Horizontal ABduction Limitations  on foam roll    External Rotation  Strengthening;Both;20 reps    Theraband Level (Shoulder External Rotation)  Level 2 (Red)    External Rotation Limitations  on foam roll    Diagonals  Strengthening;Both;20 reps    Theraband Level (Shoulder Diagonals)  Level 2 (Red)    Diagonals Limitations  on foam roll               PT Short Term Goals - 07/01/19 0857      PT SHORT TERM GOAL #1   Title  be independent with initial HEP    Baseline  independent    Status  Achieved      PT SHORT TERM GOAL #2   Title  report a 30% reduction in Lt hip and back pain with sitting and standing    Baseline  50%    Status  Achieved        PT Long Term Goals - 07/01/19 0858      PT LONG TERM GOAL #1   Title  be independent in advanced HEP    Time  12    Period  Weeks    Status  On-going      PT LONG TERM GOAL #3   Title  report a 60% reduction in Lt hip and back pain with sitting and standing    Baseline  50%    Time  12    Period  Weeks    Status  On-going            Plan - 07/03/19 0904    Clinical Impression Statement  Pt reports 50% overall improvement in lumbar/thoracic symptoms since the start of care.  Pt denies any lumbar  pain today, just stiffness.  Patient continues to progress with pain control and function. Pt experiences knee pain after squatting so this was eliminated. Pt tolerated advancement of supine thoracic strength exercises on the foam roll today.  Pt requires tactile and verbal cues for speed and technique with exercise today.  Pt will continue to benefit from skilled PT to address strength, flexibility and alignment.    PT Frequency  2x / week    PT Duration  12 weeks    PT Treatment/Interventions  Cryotherapy;Electrical Stimulation;Iontophoresis 4mg /ml Dexamethasone;Moist Heat;Traction;Ultrasound;Neuromuscular re-education;Therapeutic exercise;Therapeutic activities;Patient/family education;Manual techniques;Dry needling;Taping;Spinal Manipulations;Joint Manipulations    PT Next Visit Plan  core strength, flexibility    PT Home Exercise Plan  Access Code: [email or web address]    Consulted and Agree with Plan of Care  Patient       Patient will benefit from skilled therapeutic intervention in order to improve the following deficits and impairments:  Decreased range of motion, Increased fascial restricitons, Decreased endurance, Decreased activity tolerance, Pain, Decreased mobility, Decreased strength  Visit Diagnosis: Muscle weakness (generalized)  Cramp and spasm  Acute bilateral low back pain without sciatica     Problem List Patient Active Problem List   Diagnosis Date Noted  . 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 Hypertension 05/03/2016  . Anemia due to other cause 03/24/2014  . Routine general medical examination at a health care facility 03/24/2014  . VSD (ventricular septal defect) 03/09/2011  . URTICARIA 06/03/2008  . SEBACEOUS CYST 10/09/2007  . Asthma 10/31/2006    Sigurd Sos, PT 07/03/19 9:24 AM  [facility] [address], [place], [place], [address] Phone: [phone]   Fax:  [phone]  Name: Elizabeth Lozano MRN:  [id number] Date of Birth: 1970-08-01

## 2019-07-05 ENCOUNTER — Ambulatory Visit: Payer: BC Managed Care – PPO | Admitting: Orthopaedic Surgery

## 2019-07-05 ENCOUNTER — Encounter: Payer: Self-pay | Admitting: Orthopaedic Surgery

## 2019-07-05 ENCOUNTER — Other Ambulatory Visit: Payer: Self-pay

## 2019-07-05 VITALS — Ht 66.0 in | Wt 142.0 lb

## 2019-07-05 DIAGNOSIS — M545 Low back pain, unspecified: Secondary | ICD-10-CM

## 2019-07-08 ENCOUNTER — Other Ambulatory Visit: Payer: Self-pay

## 2019-07-08 ENCOUNTER — Ambulatory Visit: Payer: BC Managed Care – PPO

## 2019-07-08 DIAGNOSIS — M545 Low back pain, unspecified: Secondary | ICD-10-CM

## 2019-07-08 DIAGNOSIS — M6281 Muscle weakness (generalized): Secondary | ICD-10-CM | POA: Diagnosis not present

## 2019-07-08 DIAGNOSIS — R252 Cramp and spasm: Secondary | ICD-10-CM | POA: Diagnosis not present

## 2019-07-08 NOTE — Therapy (Signed)
[facility] [address], [address] [place], [place], [address] Phone: [phone]   Fax:  [phone]  Physical Therapy Treatment  Patient Details  Name: Elizabeth Lozano MRN: [id number] Date of Birth: Oct 13, 1970 Referring Provider (PT): Dr. Limmie Patricia. Philip Aspen   Encounter Date: 07/08/2019  PT End of Session - 07/08/19 0827    Visit Number  10    Date for PT Re-Evaluation  08/22/19    Authorization Type  BCBS    PT Start Time  0800    PT Stop Time  0845    PT Time Calculation (min)  45 min    Activity Tolerance  Patient tolerated treatment well    Behavior During Therapy  [facility] for tasks assessed/performed       Past Medical History:  Diagnosis Date  . Allergy    she has seen Dr. Eileen Stanford   . Anemia    long term issues per pt  . Anxiety    due to back pain   . Asthma   . Depression   . Heart murmur   . History of stress test    ETT-echo (9/14):  ST depression noted on ECGs; no wall motion abnormalities at peak exercise on echocardiogram  . VSD (ventricular septal defect)    Cardiac CTA 05/2008: Membranous VSD, 8-9 mm in diameter, mild RVE, EF 64%, calcium score 0, no CAD. Last echo 02/2011: EF 55-60%, mild CAD, perimembranous VSD slightly more prominent but no RVE or pulmonary hypertension;  Echo (9/14): Small perimembranous VSD-no significant change since 02/2011; EF 55-60%, moderate LAE    Past Surgical History:  Procedure Laterality Date  . cyst left wrist    . dental implants    . WISDOM TOOTH EXTRACTION      There were no vitals filed for this visit.  Subjective Assessment - 07/08/19 0801    Subjective  I strained my Rt quadratus over the weekend.  It is good now.    Currently in Pain?  Yes    Pain Score  1     Pain Location  Back    Pain Orientation  Right    Pain Descriptors / Indicators  Tightness    Pain Type  Acute pain    Pain Onset  More than a month ago    Pain Frequency  Constant    Aggravating Factors   sitting in the car for a long period of time (>20-25 minutes), late in the day    Pain Relieving Factors  stretching, ice, change of position                        [facility] Adult PT Treatment/Exercise - 07/08/19 0001      Lumbar Exercises: Aerobic   Elliptical  R4 Inc 5 x 7 min    UBE (Upper Arm Bike)  Level 1x 6 minutes (3/3)      Lumbar Exercises: Standing   Row  Strengthening;Both;15 reps    Theraband Level (Row)  Level 2 (Red)    Row Limitations  standing on black pad    Shoulder Extension  Strengthening;20 reps    Theraband Level (Shoulder Extension)  Level 2 (Red)    Shoulder Extension Limitations  standing on black pad    Other Standing Lumbar Exercises  walking in reverse 25# 2x10    Other Standing Lumbar Exercises  standing on black pad- yellow ball diagonals with core activation  Lumbar Exercises: Seated   Other Seated Lumbar Exercises  press into foam roll 2x10- 5 second hold      Shoulder Exercises: Supine   Horizontal ABduction  Strengthening;20 reps;Theraband    Theraband Level (Shoulder Horizontal ABduction)  Level 2 (Red)    Horizontal ABduction Limitations  on foam roll    External Rotation  Strengthening;Both;20 reps    Theraband Level (Shoulder External Rotation)  Level 2 (Red)    External Rotation Limitations  on foam roll    Diagonals  Strengthening;Both;20 reps    Theraband Level (Shoulder Diagonals)  Level 2 (Red)    Diagonals Limitations  on foam roll               PT Short Term Goals - 07/01/19 0857      PT SHORT TERM GOAL #1   Title  be independent with initial HEP    Baseline  independent    Status  Achieved      PT SHORT TERM GOAL #2   Title  report a 30% reduction in Lt hip and back pain with sitting and standing    Baseline  50%    Status  Achieved        PT Long Term Goals - 07/01/19 0858      PT LONG TERM GOAL #1   Title  be independent in advanced HEP    Time  12    Period  Weeks     Status  On-going      PT LONG TERM GOAL #3   Title  report a 60% reduction in Lt hip and back pain with sitting and standing    Baseline  50%    Time  12    Period  Weeks    Status  On-going            Plan - 07/08/19 0813    Clinical Impression Statement  Pt reports 55% overall improvement in lumbar/thoracic symptoms since the start of care.  Pt had a flare-up due to muscle strain over the weekend and this has resolved now.  Patient continues to progress with pain control and function. Pt no longer needs to ice until mid afternoon due to pain. Pt is limited to sitting > 20-25 minutes in the car and has increased pain with vacuuming and emptying the dishwasher.  Pt requires tactile and verbal cues for speed and technique with exercise today.  Pt will continue to benefit from skilled PT to address strength, flexibility and alignment.    Rehab Potential  Excellent    PT Frequency  2x / week    PT Duration  12 weeks    PT Treatment/Interventions  Cryotherapy;Electrical Stimulation;Iontophoresis 4mg /ml Dexamethasone;Moist Heat;Traction;Ultrasound;Neuromuscular re-education;Therapeutic exercise;Therapeutic activities;Patient/family education;Manual techniques;Dry needling;Taping;Spinal Manipulations;Joint Manipulations    PT Next Visit Plan  core strength, flexibility    PT Home Exercise Plan  Access Code: [email or web address]    Consulted and Agree with Plan of Care  Patient       Patient will benefit from skilled therapeutic intervention in order to improve the following deficits and impairments:  Decreased range of motion, Increased fascial restricitons, Decreased endurance, Decreased activity tolerance, Pain, Decreased mobility, Decreased strength  Visit Diagnosis: Muscle weakness (generalized)  Cramp and spasm  Acute bilateral low back pain without sciatica     Problem List Patient Active Problem List   Diagnosis Date Noted  . 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 Hypertension  05/03/2016  . Anemia due to other  cause 03/24/2014  . Routine general medical examination at a health care facility 03/24/2014  . VSD (ventricular septal defect) 03/09/2011  . URTICARIA 06/03/2008  . SEBACEOUS CYST 10/09/2007  . Asthma 10/31/2006     Sigurd Sos, PT 07/08/19 8:43 AM  [facility] [address] [place], [place], [address] Phone: [phone]   Fax:  [phone]  Name: Elizabeth Lozano MRN: [id number] Date of Birth: 13-Oct-1970

## 2019-07-08 NOTE — Progress Notes (Signed)
Office Visit Note   Patient: Elizabeth Lozano           Date of Birth: 1970/10/03           MRN: [id number] Visit Date: 07/05/2019              Requested by: Isaac Bliss, Rayford Halsted, MD [place],  [place] [address] PCP: Isaac Bliss, Rayford Halsted, MD   Assessment & Plan: Visit Diagnoses: No diagnosis found.  Plan: Patient continue finish out her physical therapy course.  She is gotten improvement with therapy.  We reviewed plain radiographs that showed blocked vertebrae versus hemivertebrae at T5-T7.  Williams radiculopathy or cord compression on exam.  She can continue conservative treatment and return in 6 weeks for recheck.  Follow-Up Instructions: Return in about 6 weeks (around 08/16/2019).   Orders:  No orders of the defined types were placed in this encounter.  No orders of the defined types were placed in this encounter.     Procedures: No procedures performed   Clinical Data: No additional findings.   Subjective: Chief Complaint  Patient presents with  . Spine - Pain    HPI 49 year old female with diagnosis of upper thoracic hemivertebrae with some scoliosis has had problems with her back off and on with primarily more low back pain recently than upper back pain.  She was moving some furniture February 1 and did more lifting than normal.  She has had increased pain in her back was referred to physical therapy and states that therapy is giving her some improvement she is used Tylenol.  She has had 9 visits to PT and has a few more coming and has noted improvement in her symptoms.  No associated bowel bladder symptoms no leg giving way no falling.  No chills or fever.  Patient not had a MRI scan thoracic or lumbar spine.  Review of Systems use systems positive for hypertension history of VSD otherwise noncontributory to HPI.   Objective: Vital Signs: Ht 5\' 6"  (1.676 m)   Wt 142 lb (64.4 kg)   BMI 22.92 kg/m   Physical Exam Constitutional:    Appearance: She is well-developed.  HENT:     Head: Normocephalic.     Right Ear: External ear normal.     Left Ear: External ear normal.  Eyes:     Pupils: Pupils are equal, round, and reactive to light.  Neck:     Thyroid: No thyromegaly.     Trachea: No tracheal deviation.  Cardiovascular:     Rate and Rhythm: Normal rate.  Pulmonary:     Effort: Pulmonary effort is normal.  Abdominal:     Palpations: Abdomen is soft.  Skin:    General: Skin is warm and dry.  Neurological:     Mental Status: She is alert and oriented to person, place, and time.  Psychiatric:        Behavior: Behavior normal.     Ortho Exam patient has intact lower extremities.  She has some tenderness of the lumbosacral spine.  She is able to heel and toe walk.  Symmetrical knee and ankle jerk right and left.  She is anxious and can reach fingertips to mid to distal thigh region and is extremely anxious on wanting to flex further for fear she may have some problems.  Anterior tib gastrocsoleus is intact and strong.  Specialty Comments:  No specialty comments available.  Imaging: CLINICAL DATA:  LEFT side pain with sitting for  4 yrs, worsening recently  EXAM: THORACIC SPINE - 2 VIEW + SWIMMERS  COMPARISON:  Chest radiograph 08/24/2012, 08/19/2011  FINDINGS: Twelve pairs of ribs.  Biconvex thoracolumbar scoliosis with prominent focal levoconvex scoliosis apex T5.  Question congenital block vertebra of T5-T7.  No definite fracture, subluxation or bone destruction.  Visualized posterior ribs unremarkable.  Abnormal cardiac contour with prominence of the main and LEFT pulmonary arteries, patient with history of VSD.  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  No acute abnormalities.   Electronically Signed   By: Ulyses Southward M.D.   On: 06/16/2014 16:01   PMFS History: Patient Active Problem List   Diagnosis Date Noted  . Low  blood monocyte count 07/06/2016  . Hypertension 05/03/2016  . Anemia due to other cause 03/24/2014  . Routine general medical examination at a health care facility 03/24/2014  . VSD (ventricular septal defect) 03/09/2011  . URTICARIA 06/03/2008  . SEBACEOUS CYST 10/09/2007  . Asthma 10/31/2006   Past Medical History:  Diagnosis Date  . Allergy    she has seen Dr. Eileen Stanford   . Anemia    long term issues per pt  . Anxiety    due to back pain   . Asthma   . Depression   . Heart murmur   . History of stress test    ETT-echo (9/14):  ST depression noted on ECGs; no wall motion abnormalities at peak exercise on echocardiogram  . VSD (ventricular septal defect)    Cardiac CTA 05/2008: Membranous VSD, 8-9 mm in diameter, mild RVE, EF 64%, calcium score 0, no CAD. Last echo 02/2011: EF 55-60%, mild CAD, perimembranous VSD slightly more prominent but no RVE or pulmonary hypertension;  Echo (9/14): Small perimembranous VSD-no significant change since 02/2011; EF 55-60%, moderate LAE    Family History  Problem Relation Age of Onset  . Sarcoidosis Mother   . Colon polyps Mother   . Heart attack Father   . Hypertension Father   . Hyperlipidemia Father   . Melanoma Father   . Cancer Father   . Healthy Brother   . Colon cancer Paternal Grandmother   . Crohn's disease Neg Hx   . Esophageal cancer Neg Hx   . Rectal cancer Neg Hx   . Stomach cancer Neg Hx     Past Surgical History:  Procedure Laterality Date  . cyst left wrist    . dental implants    . WISDOM TOOTH EXTRACTION     Social History   Occupational History  . Not on file  Tobacco Use  . Smoking status: Never Smoker  . Smokeless tobacco: Never Used  Substance and Sexual Activity  . Alcohol use: Yes    Alcohol/week: 4.0 standard drinks    Types: 4 Standard drinks or equivalent per week    Comment: one beer per day per pt  . Drug use: No  . Sexual activity: Never

## 2019-07-12 ENCOUNTER — Encounter: Payer: Self-pay | Admitting: Physical Therapy

## 2019-07-12 ENCOUNTER — Ambulatory Visit: Payer: BC Managed Care – PPO | Admitting: Physical Therapy

## 2019-07-12 ENCOUNTER — Other Ambulatory Visit: Payer: Self-pay

## 2019-07-12 DIAGNOSIS — R252 Cramp and spasm: Secondary | ICD-10-CM | POA: Diagnosis not present

## 2019-07-12 DIAGNOSIS — M6281 Muscle weakness (generalized): Secondary | ICD-10-CM

## 2019-07-12 DIAGNOSIS — M545 Low back pain, unspecified: Secondary | ICD-10-CM

## 2019-07-12 NOTE — Therapy (Signed)
[facility] [address], [address] [place], [place], [address] Phone: [phone]   Fax:  [phone]  Physical Therapy Treatment  Patient Details  Name: Elizabeth Lozano MRN: [id number] Date of Birth: [birth year] Referring Provider (PT): Dr. Limmie Patricia. Philip Aspen   Encounter Date: 07/12/2019  PT End of Session - 07/12/19 1022    Visit Number  11    Date for PT Re-Evaluation  08/22/19    Authorization Type  BCBS    PT Start Time  1016    PT Stop Time  1100    PT Time Calculation (min)  44 min    Activity Tolerance  Patient tolerated treatment well    Behavior During Therapy  WFL for tasks assessed/performed       Past Medical History:  Diagnosis Date  . Allergy    she has seen Dr. Eileen Stanford   . Anemia    long term issues per pt  . Anxiety    due to back pain   . Asthma   . Depression   . Heart murmur   . History of stress test    ETT-echo (9/14):  ST depression noted on ECGs; no wall motion abnormalities at peak exercise on echocardiogram  . VSD (ventricular septal defect)    Cardiac CTA 05/2008: Membranous VSD, 8-9 mm in diameter, mild RVE, EF 64%, calcium score 0, no CAD. Last echo 02/2011: EF 55-60%, mild CAD, perimembranous VSD slightly more prominent but no RVE or pulmonary hypertension;  Echo (9/14): Small perimembranous VSD-no significant change since 02/2011; EF 55-60%, moderate LAE    Past Surgical History:  Procedure Laterality Date  . cyst left wrist    . dental implants    . WISDOM TOOTH EXTRACTION      There were no vitals filed for this visit.  Subjective Assessment - 07/12/19 1019    Subjective  I spilled a bottle of pills and had to crawl around after them so that bothered my back a bit but I'm ok.    How long can you sit comfortably?  15 minutes    How long can you stand comfortably?  1.5 hours, used to do 12 hours    Patient Stated Goals  standing, sitting, return to daily tasks    Currently in  Pain?  Yes    Pain Score  2     Pain Location  Back    Pain Orientation  Right;Left;Lower    Pain Descriptors / Indicators  Tightness    Pain Type  Acute pain    Pain Radiating Towards  sacrum, Rt QL    Pain Onset  More than a month ago    Aggravating Factors   sitting in car >20-25 min, late in day    Pain Relieving Factors  stretch, ice, change position                        [facility] Adult PT Treatment/Exercise - 07/12/19 0001      Lumbar Exercises: Aerobic   Elliptical  R4 Inc 5 x 7 min      Lumbar Exercises: Seated   Other Seated Lumbar Exercises  press into foam roll 2x10- 5 second hold      Shoulder Exercises: Supine   Horizontal ABduction  Strengthening;20 reps;Theraband    Theraband Level (Shoulder Horizontal ABduction)  Level 3 (Green)    Horizontal ABduction Limitations  on foam roll    External Rotation  Strengthening;Both;20 reps    Theraband Level (Shoulder External Rotation)  Level 3 (Green)    External Rotation Limitations  on foam roll    Diagonals  Strengthening;Both;20 reps    Theraband Level (Shoulder Diagonals)  Level 3 (Green)    Diagonals Limitations  on foam roll      Shoulder Exercises: Standing   Extension  Strengthening;Both;20 reps;Theraband    Theraband Level (Shoulder Extension)  Level 3 (Green)    Extension Limitations  standing on black pad    Row  Strengthening;Both;20 reps;Theraband    Theraband Level (Shoulder Row)  Level 3 (Green)    Row Limitations  standing on black pad      Shoulder Exercises: ROM/Strengthening   UBE (Upper Arm Bike)  L1.5 x 6' fwd    Other ROM/Strengthening Exercises  reverse walking 25# 2x10 - PT showed Pt how to do this at home with green band    Other ROM/Strengthening Exercises  forward walking with green band x 3 reps to show advancement of exercise when read               PT Short Term Goals - 07/01/19 0857      PT SHORT TERM GOAL #1   Title  be independent with initial HEP     Baseline  independent    Status  Achieved      PT SHORT TERM GOAL #2   Title  report a 30% reduction in Lt hip and back pain with sitting and standing    Baseline  50%    Status  Achieved        PT Long Term Goals - 07/12/19 1027      PT LONG TERM GOAL #1   Title  be independent in advanced HEP    Status  On-going      PT LONG TERM GOAL #2   Title  reduce FOTO to < or = to 33% limitation    Status  On-going      PT LONG TERM GOAL #3   Title  report a 60% reduction in Lt hip and back pain with sitting and standing    Baseline  50%    Status  On-going      PT LONG TERM GOAL #4   Title  able to perfrom her usual daily activities with pain decreased >/= 75%    Baseline  still pain with vacuum, emptying/loading dishwasher, putting leash on her dog    Status  On-going      PT LONG TERM GOAL #5   Title  able to go up and down stairs with >/= 75% reduction in pain due to improve strength of her hips and core    Baseline  patient reports she does not climb stairs much    Status  On-going            Plan - 07/12/19 1029    Clinical Impression Statement  Pt reports she is able to now sit x 15' before pain limits her.  She is now able to drive for shorter distances with less pain.  Ongoing challenges with dishwasher and vacuuming.  Pt is tolerating increased endurance and strength without exacerbation of pain.  Ultimate goal of Pt is to be able to tolerate increased car time and a plane ride to visit her parents in September.  PT progressed supine shoulder series from red to green band today and gave green band for HEP.  She continues to find relief from  opening up her chest on foam roller.  PT cued her to slow eccentric phases of all t-band ther ex.  PT discussed strategies for vacuuming and emptying dishwasher today given these are exacerbating household tasks.  Pt will continue to benefit from skilled PT along POC.    Examination-Activity Limitations   Sit;Stand;Lift;Squat;Carry;Locomotion Level    Examination-Participation Restrictions  Cleaning;Driving;Shop;Laundry;Community Activity    Rehab Potential  Excellent    PT Frequency  2x / week    PT Duration  12 weeks    PT Treatment/Interventions  Cryotherapy;Electrical Stimulation;Iontophoresis 4mg /ml Dexamethasone;Moist Heat;Traction;Ultrasound;Neuromuscular re-education;Therapeutic exercise;Therapeutic activities;Patient/family education;Manual techniques;Dry needling;Taping;Spinal Manipulations;Joint Manipulations    PT Next Visit Plan  core strength, flexibility    PT Home Exercise Plan  Access Code: [email or web address]    Consulted and Agree with Plan of Care  Patient       Patient will benefit from skilled therapeutic intervention in order to improve the following deficits and impairments:     Visit Diagnosis: Muscle weakness (generalized)  Acute bilateral low back pain without sciatica     Problem List Patient Active Problem List   Diagnosis Date Noted  . 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 Hypertension 05/03/2016  . Low back pain 06/16/2014  . Anemia due to other cause 03/24/2014  . Routine general medical examination at a health care facility 03/24/2014  . VSD (ventricular septal defect) 03/09/2011  . URTICARIA 06/03/2008  . SEBACEOUS CYST 10/09/2007  . Asthma 10/31/2006    Baruch Merl, PT 07/12/19 10:56 AM   [facility] [address], [place], [place], [address] Phone: [phone]   Fax:  [phone]  Name: Dalina Samara MRN: [id number] Date of Birth: [birth year]

## 2019-07-16 ENCOUNTER — Other Ambulatory Visit: Payer: Self-pay

## 2019-07-16 ENCOUNTER — Ambulatory Visit: Payer: BC Managed Care – PPO | Attending: Internal Medicine

## 2019-07-16 DIAGNOSIS — R252 Cramp and spasm: Secondary | ICD-10-CM | POA: Insufficient documentation

## 2019-07-16 DIAGNOSIS — M6281 Muscle weakness (generalized): Secondary | ICD-10-CM | POA: Diagnosis not present

## 2019-07-16 DIAGNOSIS — M545 Low back pain, unspecified: Secondary | ICD-10-CM

## 2019-07-16 NOTE — Therapy (Signed)
[facility] [address], [place], [place], [address] Phone: [phone]   Fax:  [phone]  Physical Therapy Treatment  Patient Details  Name: Elizabeth Lozano MRN: [id number] Date of Birth: 1970/10/26 Referring Provider (PT): Dr. Rayford Halsted. Isaac Bliss   Encounter Date: 07/16/2019  PT End of Session - 07/16/19 1316    Visit Number  12    Date for PT Re-Evaluation  08/22/19    Authorization Type  BCBS    PT Start Time  1232    PT Stop Time  1314    PT Time Calculation (min)  42 min    Activity Tolerance  Patient tolerated treatment well    Behavior During Therapy  WFL for tasks assessed/performed       Past Medical History:  Diagnosis Date  . Allergy    she has seen Dr. Tiajuana Amass   . Anemia    long term issues per pt  . Anxiety    due to back pain   . Asthma   . Depression   . Heart murmur   . History of stress test    ETT-echo (9/14):  ST depression noted on ECGs; no wall motion abnormalities at peak exercise on echocardiogram  . VSD (ventricular septal defect)    Cardiac CTA 05/2008: Membranous VSD, 8-9 mm in diameter, mild RVE, EF 64%, calcium score 0, no CAD. Last echo 02/2011: EF 55-60%, mild CAD, perimembranous VSD slightly more prominent but no RVE or pulmonary hypertension;  Echo (9/14): Small perimembranous VSD-no significant change since 02/2011; EF 55-60%, moderate LAE    Past Surgical History:  Procedure Laterality Date  . cyst left wrist    . dental implants    . WISDOM TOOTH EXTRACTION      There were no vitals filed for this visit.  Subjective Assessment - 07/16/19 1302    Subjective  I am doing 60% better overall.  Sitting long periods is still hard.    Currently in Pain?  No/denies    Pain Score  --   up to 3-/410 at the end of the day.   Pain Location  Back    Pain Orientation  Lower    Pain Onset  More than a month ago    Pain Frequency  Constant    Aggravating Factors   late in  the day, sitting in the car    Pain Relieving Factors  stretch, ice, change of position                        [facility] Adult PT Treatment/Exercise - 07/16/19 0001      Lumbar Exercises: Aerobic   Elliptical  R4 Inc 5 x 7 min   PT present to discuss progress     Lumbar Exercises: Seated   Other Seated Lumbar Exercises  press into foam roll 2x10- 5 second hold    Other Seated Lumbar Exercises  seated on red ball: 1# 2x10- 3 way raises      Shoulder Exercises: Supine   Horizontal ABduction  Strengthening;20 reps;Theraband    Theraband Level (Shoulder Horizontal ABduction)  Level 3 (Green)    Horizontal ABduction Limitations  on foam roll    External Rotation  Strengthening;Both;20 reps    Theraband Level (Shoulder External Rotation)  Level 3 (Green)    External Rotation Limitations  on foam roll    Diagonals  Strengthening;Both;20 reps    Theraband Level (Shoulder Diagonals)  Level 3 (Green)    Diagonals Limitations  on foam roll      Shoulder Exercises: Standing   Extension  Strengthening;Both;20 reps;Theraband    Theraband Level (Shoulder Extension)  Level 3 (Green)    Extension Limitations  standing on black pad    Row  Strengthening;Both;20 reps;Theraband    Theraband Level (Shoulder Row)  Level 3 (Green)    Row Limitations  standing on black pad      Shoulder Exercises: ROM/Strengthening   UBE (Upper Arm Bike)  L 2  x 6' fwd    Other ROM/Strengthening Exercises  reverse walking 25# 2x10                PT Short Term Goals - 07/16/19 1233      PT SHORT TERM GOAL #2   Title  report a 30% reduction in Lt hip and back pain with sitting and standing    Baseline  50%    Status  Achieved        PT Long Term Goals - 07/16/19 1236      PT LONG TERM GOAL #1   Title  be independent in advanced HEP    Time  12    Period  Weeks    Status  On-going      PT LONG TERM GOAL #3   Title  report a 60% reduction in Lt hip and back pain with sitting and  standing    Baseline  60%    Status  Achieved      PT LONG TERM GOAL #5   Title  able to go up and down stairs with >/= 75% reduction in pain due to improve strength of her hips and core    Time  12    Period  Weeks    Status  On-going            Plan - 07/16/19 1243    Clinical Impression Statement  Pt reports 60% overall improvement in symptoms since the start of care.  Pt continues to have mild pain with sitting and standing longer periods.  Pt reports she is able to now sit x 15 minutes before pain limits her.  She is now able to drive for shorter distances with less pain.  Pt with ongoing challenges with dishwasher and vacuuming.  Pt is tolerating increased endurance and strength without exacerbation of pain today.  Pt will continue to benefit from skilled PT to address hip and core strength and pain.    PT Frequency  2x / week    PT Duration  12 weeks    PT Treatment/Interventions  Cryotherapy;Electrical Stimulation;Iontophoresis 4mg /ml Dexamethasone;Moist Heat;Traction;Ultrasound;Neuromuscular re-education;Therapeutic exercise;Therapeutic activities;Patient/family education;Manual techniques;Dry needling;Taping;Spinal Manipulations;Joint Manipulations    PT Next Visit Plan  core strength, flexibility    PT Home Exercise Plan  Access Code: [email or web address]    Consulted and Agree with Plan of Care  Patient       Patient will benefit from skilled therapeutic intervention in order to improve the following deficits and impairments:  Decreased range of motion, Increased fascial restricitons, Decreased endurance, Decreased activity tolerance, Pain, Decreased mobility, Decreased strength  Visit Diagnosis: Muscle weakness (generalized)  Acute bilateral low back pain without sciatica  Cramp and spasm     Problem List Patient Active Problem List   Diagnosis Date Noted  . 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 Hypertension 05/03/2016  . Low back pain 06/16/2014  . Anemia due to other  cause 03/24/2014  .  Routine general medical examination at a health care facility 03/24/2014  . VSD (ventricular septal defect) 03/09/2011  . URTICARIA 06/03/2008  . SEBACEOUS CYST 10/09/2007  . Asthma 10/31/2006     Lorrene Reid, PT 07/16/19 1:18 PM  [facility] [address], [address] [place], [place], [address] Phone: [phone]   Fax:  [phone]  Name: Katheren Jimmerson MRN: [id number] Date of Birth: [birth year]

## 2019-07-18 ENCOUNTER — Ambulatory Visit: Payer: BC Managed Care – PPO

## 2019-07-18 ENCOUNTER — Other Ambulatory Visit: Payer: Self-pay

## 2019-07-18 DIAGNOSIS — M6281 Muscle weakness (generalized): Secondary | ICD-10-CM | POA: Diagnosis not present

## 2019-07-18 DIAGNOSIS — R252 Cramp and spasm: Secondary | ICD-10-CM | POA: Diagnosis not present

## 2019-07-18 DIAGNOSIS — M545 Low back pain, unspecified: Secondary | ICD-10-CM

## 2019-07-18 NOTE — Therapy (Signed)
[facility] [address] [place], [place], [address] Phone: [phone]   Fax:  [phone]  Physical Therapy Treatment  Patient Details  Name: Elizabeth Lozano MRN: [id number] Date of Birth: [birth year] Referring Provider (PT): Dr. Limmie Patricia. Philip Aspen   Encounter Date: 07/18/2019  PT End of Session - 07/18/19 1307    Visit Number  13    Date for PT Re-Evaluation  08/22/19    Authorization Type  BCBS    PT Start Time  1231    PT Stop Time  1313    PT Time Calculation (min)  42 min    Activity Tolerance  Patient tolerated treatment well    Behavior During Therapy  WFL for tasks assessed/performed       Past Medical History:  Diagnosis Date   Allergy    she has seen Dr. Eileen Stanford    Anemia    long term issues per pt   Anxiety    due to back pain    Asthma    Depression    Heart murmur    History of stress test    ETT-echo (9/14):  ST depression noted on ECGs; no wall motion abnormalities at peak exercise on echocardiogram   VSD (ventricular septal defect)    Cardiac CTA 05/2008: Membranous VSD, 8-9 mm in diameter, mild RVE, EF 64%, calcium score 0, no CAD. Last echo 02/2011: EF 55-60%, mild CAD, perimembranous VSD slightly more prominent but no RVE or pulmonary hypertension;  Echo (9/14): Small perimembranous VSD-no significant change since 02/2011; EF 55-60%, moderate LAE    Past Surgical History:  Procedure Laterality Date   cyst left wrist     dental implants     WISDOM TOOTH EXTRACTION      There were no vitals filed for this visit.  Subjective Assessment - 07/18/19 1236    Subjective  I woke up and I was pain free this morning for an hour.  Now it is mild.  I have slept 7 nights now without waking due to pain.    Currently in Pain?  No/denies                        [facility] Adult PT Treatment/Exercise - 07/18/19 0001      Lumbar Exercises: Aerobic   Elliptical  R4 Inc 5 x  7 min   PT present to discuss progress     Lumbar Exercises: Seated   Other Seated Lumbar Exercises  press into foam roll 2x10- 5 second hold    Other Seated Lumbar Exercises  seated on red ball: 1# 2x10- 3 way raises      Lumbar Exercises: Supine   Straight Leg Raise  20 reps    Straight Leg Raises Limitations  cueing for pelvic tilt to reduce strain on lumbar spine      Lumbar Exercises: Prone   Straight Leg Raise  20 reps    Straight Leg Raises Limitations  pillow under abdomen-tactile and verbal cues needed for Rt LE alignment      Shoulder Exercises: Supine   Horizontal ABduction  Strengthening;20 reps;Theraband    Theraband Level (Shoulder Horizontal ABduction)  Level 3 (Green)    Horizontal ABduction Limitations  on foam roll    External Rotation  Strengthening;Both;20 reps    Theraband Level (Shoulder External Rotation)  Level 3 (Green)    External Rotation Limitations  on foam roll    Diagonals  Strengthening;Both;20 reps    Theraband Level (Shoulder Diagonals)  Level 3 (Green)    Diagonals Limitations  on foam roll      Shoulder Exercises: Standing   Extension  Strengthening;Both;20 reps;Theraband    Theraband Level (Shoulder Extension)  Level 3 (Green)    Extension Limitations  standing on black pad    Row  Strengthening;Both;20 reps;Theraband    Theraband Level (Shoulder Row)  Level 3 (Green)    Row Limitations  standing on black pad      Shoulder Exercises: ROM/Strengthening   UBE (Upper Arm Bike)  L 2  x 6' fwd    Other ROM/Strengthening Exercises  reverse walking 30# 2x10                PT Short Term Goals - 07/16/19 1233      PT SHORT TERM GOAL #2   Title  report a 30% reduction in Lt hip and back pain with sitting and standing    Baseline  50%    Status  Achieved        PT Long Term Goals - 07/16/19 1236      PT LONG TERM GOAL #1   Title  be independent in advanced HEP    Time  12    Period  Weeks    Status  On-going      PT LONG TERM  GOAL #3   Title  report a 60% reduction in Lt hip and back pain with sitting and standing    Baseline  60%    Status  Achieved      PT LONG TERM GOAL #5   Title  able to go up and down stairs with >/= 75% reduction in pain due to improve strength of her hips and core    Time  12    Period  Weeks    Status  On-going            Plan - 07/18/19 1246    Clinical Impression Statement  Pt reports 60% overall improvement in symptoms since the start of care.  Pt has been able to sleep for 7 nights without waking due to pain.  Pt continues to have mild pain with sitting and standing longer periods.  Pt reports she is able to now sit x 15 minutes before pain limits her.  She is now able to drive for shorter distances with less pain.   Pt is tolerating increased endurance and strength without exacerbation of pain today.  Pt will continue to benefit from skilled PT to address hip and core strength and pain.    PT Frequency  2x / week    PT Duration  12 weeks    PT Treatment/Interventions  Cryotherapy;Electrical Stimulation;Iontophoresis 4mg /ml Dexamethasone;Moist Heat;Traction;Ultrasound;Neuromuscular re-education;Therapeutic exercise;Therapeutic activities;Patient/family education;Manual techniques;Dry needling;Taping;Spinal Manipulations;Joint Manipulations    PT Next Visit Plan  core strength, flexibility    PT Home Exercise Plan  Access Code: [email or web address]    Consulted and Agree with Plan of Care  Patient       Patient will benefit from skilled therapeutic intervention in order to improve the following deficits and impairments:  Decreased range of motion, Increased fascial restricitons, Decreased endurance, Decreased activity tolerance, Pain, Decreased mobility, Decreased strength  Visit Diagnosis: Muscle weakness (generalized)  Acute bilateral low back pain without sciatica  Cramp and spasm     Problem List Patient Active Problem List   Diagnosis Date Noted   Low blood monocyte count  07/06/2016  Hypertension 05/03/2016   Low back pain 06/16/2014   Anemia due to other cause 03/24/2014   Routine general medical examination at a health care facility 03/24/2014   VSD (ventricular septal defect) 03/09/2011   URTICARIA 06/03/2008   SEBACEOUS CYST 10/09/2007   Asthma 10/31/2006    Sigurd Sos, PT 07/18/19 1:09 PM  [facility] [address], [place], [place], [address] Phone: [phone]   Fax:  [phone]  Name: Elizabeth Lozano MRN: [id number] Date of Birth: [birth year]

## 2019-07-23 ENCOUNTER — Other Ambulatory Visit: Payer: Self-pay

## 2019-07-23 ENCOUNTER — Ambulatory Visit: Payer: BC Managed Care – PPO

## 2019-07-23 DIAGNOSIS — M545 Low back pain, unspecified: Secondary | ICD-10-CM

## 2019-07-23 DIAGNOSIS — M6281 Muscle weakness (generalized): Secondary | ICD-10-CM

## 2019-07-23 DIAGNOSIS — R252 Cramp and spasm: Secondary | ICD-10-CM

## 2019-07-23 NOTE — Therapy (Signed)
[facility] [address] [place], [place], [address] Phone: [phone]   Fax:  [phone]  Physical Therapy Treatment  Patient Details  Name: Elizabeth Lozano MRN: [id number] Date of Birth: 1970/08/09 Referring Provider (PT): Dr. Rayford Halsted. Isaac Bliss   Encounter Date: 07/23/2019  PT End of Session - 07/23/19 1056    Visit Number  14    Date for PT Re-Evaluation  08/22/19    Authorization Type  BCBS    PT Start Time  1016    PT Stop Time  1101    PT Time Calculation (min)  45 min    Activity Tolerance  Patient tolerated treatment well    Behavior During Therapy  WFL for tasks assessed/performed       Past Medical History:  Diagnosis Date  . Allergy    she has seen Dr. Tiajuana Amass   . Anemia    long term issues per pt  . Anxiety    due to back pain   . Asthma   . Depression   . Heart murmur   . History of stress test    ETT-echo (9/14):  ST depression noted on ECGs; no wall motion abnormalities at peak exercise on echocardiogram  . VSD (ventricular septal defect)    Cardiac CTA 05/2008: Membranous VSD, 8-9 mm in diameter, mild RVE, EF 64%, calcium score 0, no CAD. Last echo 02/2011: EF 55-60%, mild CAD, perimembranous VSD slightly more prominent but no RVE or pulmonary hypertension;  Echo (9/14): Small perimembranous VSD-no significant change since 02/2011; EF 55-60%, moderate LAE    Past Surgical History:  Procedure Laterality Date  . cyst left wrist    . dental implants    . WISDOM TOOTH EXTRACTION      There were no vitals filed for this visit.  Subjective Assessment - 07/23/19 1021    Subjective  I felt good for 24 hours after last session, then I was sore for 24 hours.  Now I feel good.    Currently in Pain?  Yes    Pain Score  1     Pain Location  Back    Pain Orientation  Lower    Pain Descriptors / Indicators  Tightness    Pain Type  Acute pain    Pain Onset  More than a month ago    Pain  Frequency  Intermittent    Aggravating Factors   riding in the car    Pain Relieving Factors  stretch, ice, change of position                        [facility] Adult PT Treatment/Exercise - 07/23/19 0001      Lumbar Exercises: Aerobic   Elliptical  R4 Inc 5 x 7 min   PT present to discuss progress     Lumbar Exercises: Seated   Other Seated Lumbar Exercises  press into foam roll 2x10- 5 second hold    Other Seated Lumbar Exercises  seated on red ball: 1# 2x10- 3 way raises      Lumbar Exercises: Supine   Straight Leg Raise  20 reps    Straight Leg Raises Limitations  cueing for pelvic tilt to reduce strain on lumbar spine      Lumbar Exercises: Prone   Straight Leg Raise  20 reps    Straight Leg Raises Limitations  pillow under abdomen-tactile and verbal cues needed for Rt LE alignment  Shoulder Exercises: Supine   Horizontal ABduction  Strengthening;20 reps;Theraband    Theraband Level (Shoulder Horizontal ABduction)  Level 3 (Green)    Horizontal ABduction Limitations  on foam roll    External Rotation  Strengthening;Both;20 reps    Theraband Level (Shoulder External Rotation)  Level 3 (Green)    External Rotation Limitations  on foam roll    Diagonals  Strengthening;Both;20 reps    Theraband Level (Shoulder Diagonals)  Level 3 (Green)    Diagonals Limitations  on foam roll      Shoulder Exercises: Standing   Extension  Strengthening;Both;20 reps;Weights    Extension Weight (lbs)  15#    Extension Limitations  standing on black pad    Row  Strengthening;Both;20 reps;Weights    Row Weight (lbs)  25#     Row Limitations  standing on black pad      Shoulder Exercises: ROM/Strengthening   UBE (Upper Arm Bike)  L 2  x 6' fwd    Other ROM/Strengthening Exercises  reverse walking 30# 2x10                PT Short Term Goals - 07/16/19 1233      PT SHORT TERM GOAL #2   Title  report a 30% reduction in Lt hip and back pain with sitting and standing     Baseline  50%    Status  Achieved        PT Long Term Goals - 07/23/19 1027      PT LONG TERM GOAL #3   Title  report a 60% reduction in Lt hip and back pain with sitting and standing    Baseline  65%    Status  Achieved      PT LONG TERM GOAL #5   Title  able to go up and down stairs with >/= 75% reduction in pain due to improve strength of her hips and core    Time  12    Period  Weeks    Status  On-going            Plan - 07/23/19 1033    Clinical Impression Statement  Pt reports 65% overall improvement in symptoms since the start of care.  Pt has been able to sleep without waking due to pain.  Pt has tried to reduce her use of pain medication at night and finds she still needs to take one Aleve before bed.  Pt continues to have mild pain with sitting and standing longer periods.   She is now able to drive for shorter distances with less pain.   Pt is tolerating increased endurance and strength without exacerbation of pain today.  Pt demonstrated improved Rt hip extension with neutral hip position today.    Pt will continue to benefit from skilled PT to address hip and core strength and pain.    PT Frequency  2x / week    PT Duration  12 weeks    PT Treatment/Interventions  Cryotherapy;Electrical Stimulation;Iontophoresis 4mg /ml Dexamethasone;Moist Heat;Traction;Ultrasound;Neuromuscular re-education;Therapeutic exercise;Therapeutic activities;Patient/family education;Manual techniques;Dry needling;Taping;Spinal Manipulations;Joint Manipulations    PT Next Visit Plan  core strength, flexibility    PT Home Exercise Plan  Access Code: [email or web address]    Consulted and Agree with Plan of Care  Patient       Patient will benefit from skilled therapeutic intervention in order to improve the following deficits and impairments:  Decreased range of motion, Increased fascial restricitons, Decreased endurance, Decreased activity tolerance, Pain, Decreased mobility,  Decreased strength  Visit  Diagnosis: Muscle weakness (generalized)  Acute bilateral low back pain without sciatica  Cramp and spasm     Problem List Patient Active Problem List   Diagnosis Date Noted  . 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 Hypertension 05/03/2016  . Low back pain 06/16/2014  . Anemia due to other cause 03/24/2014  . Routine general medical examination at a health care facility 03/24/2014  . VSD (ventricular septal defect) 03/09/2011  . URTICARIA 06/03/2008  . SEBACEOUS CYST 10/09/2007  . Asthma 10/31/2006     Lorrene Reid, PT 07/23/19 10:57 AM  [facility] [address], [address] [place], [place], [address] Phone: [phone]   Fax:  [phone]  Name: Elizabeth Lozano MRN: [id number] Date of Birth: [birth year]

## 2019-07-26 ENCOUNTER — Other Ambulatory Visit: Payer: Self-pay

## 2019-07-26 ENCOUNTER — Encounter: Payer: Self-pay | Admitting: Physical Therapy

## 2019-07-26 ENCOUNTER — Ambulatory Visit: Payer: BC Managed Care – PPO | Admitting: Physical Therapy

## 2019-07-26 DIAGNOSIS — M545 Low back pain, unspecified: Secondary | ICD-10-CM

## 2019-07-26 DIAGNOSIS — M6281 Muscle weakness (generalized): Secondary | ICD-10-CM

## 2019-07-26 DIAGNOSIS — R252 Cramp and spasm: Secondary | ICD-10-CM | POA: Diagnosis not present

## 2019-07-26 NOTE — Patient Instructions (Signed)
Access Code: [email or web address] URL: [email or web address] Date: 07/26/2019 Prepared by: Lavinia Sharps  Exercises TL Sidebending Stretch - Arms Overhead - 1 x daily - 7 x weekly - 1 sets - 3 reps - 10-30 hold Half Kneeling Hip Flexor Stretch - 1 x daily - 7 x weekly - 1 sets - 3 reps - 10-30 hold Quadriceps Stretch with Chair - 1 x daily - 7 x weekly - 1 sets - 3 reps - 10-30 hold Seated Trunk Rotation - Arms Crossed - 1 x daily - 7 x weekly - 1 sets - 10 reps - 2-3 hold Seated Thoracic Flexion and Rotation with Swiss Ball - 1 x daily - 7 x weekly - 1 sets - 5 reps - 5-10 hold Thomas Stretch on Table - 1 x daily - 7 x weekly - 1 sets - 1 reps - 20-30 hold Shoulder extension with resistance - Neutral - 1 x daily - 7 x weekly - 2 sets - 10 reps - 5 hold Dead Bug Alternating Arm Extension - 1 x daily - 7 x weekly - 1 sets - 10 reps Supine Chest Stretch on Foam Roll - 1 x daily - 7 x weekly - 1 sets - 1 reps - 30 sec hold Mini Squat/Good Morning - 1 x daily - 7 x weekly - 3 sets - 10 reps Prone Horizontal Abduction with Palms Down - 1 x daily - 7 x weekly - 10 reps - 3 sets Prone Scapular Retraction Y - 1 x daily - 7 x weekly - 10 reps - 3 sets Standing Row with Anchored Resistance - 1 x daily - 7 x weekly - 10 reps - 3 sets Shoulder Extension with Resistance - 1 x daily - 7 x weekly - 10 reps - 3 sets Sidelying Open Book Thoracic Rotation with Knee on Foam Roll - 2 x daily - 7 x weekly - 1 sets - 10 reps Supine Shoulder Horizontal Abduction with Resistance - 2 x daily - 7 x weekly - 10 reps - 2 sets Supine Bilateral Shoulder External Rotation with Resistance - 2 x daily - 7 x weekly - 10 reps - 2 sets Supine PNF D2 Flexion with Resistance - 2 x daily - 7 x weekly - 10 reps - 2 sets Hooklying Isometric Hip Flexion - 1 x daily - 7 x weekly - 1 sets - 10 reps Supine 90/90 Alternating Toe Touch - 1 x daily - 7 x weekly - 1 sets - 10 reps Prone Hip Extension with Pillow Under Abdomen - 1 x  daily - 7 x weekly - 2 sets - 10 reps

## 2019-07-26 NOTE — Therapy (Signed)
[facility] [address] [place], [place], [address] Phone: [phone]   Fax:  [phone]  Physical Therapy Treatment  Patient Details  Name: Elizabeth Lozano MRN: [id number] Date of Birth: 1970/07/01 Referring Provider (PT): Dr. Limmie Patricia. Philip Aspen   Encounter Date: 07/26/2019   PT End of Session - 07/26/19 0922    Visit Number 15    Date for PT Re-Evaluation 08/22/19    Authorization Type BCBS    PT Start Time 0847    PT Stop Time 0927    PT Time Calculation (min) 40 min    Activity Tolerance Patient tolerated treatment well           Past Medical History:  Diagnosis Date  . Allergy    she has seen Dr. Eileen Stanford   . Anemia    long term issues per pt  . Anxiety    due to back pain   . Asthma   . Depression   . Heart murmur   . History of stress test    ETT-echo (9/14):  ST depression noted on ECGs; no wall motion abnormalities at peak exercise on echocardiogram  . VSD (ventricular septal defect)    Cardiac CTA 05/2008: Membranous VSD, 8-9 mm in diameter, mild RVE, EF 64%, calcium score 0, no CAD. Last echo 02/2011: EF 55-60%, mild CAD, perimembranous VSD slightly more prominent but no RVE or pulmonary hypertension;  Echo (9/14): Small perimembranous VSD-no significant change since 02/2011; EF 55-60%, moderate LAE    Past Surgical History:  Procedure Laterality Date  . cyst left wrist    . dental implants    . WISDOM TOOTH EXTRACTION      There were no vitals filed for this visit.   Subjective Assessment - 07/26/19 0849    Subjective I'm having a better day.  Slept good last night.  The SLR exercise bothers my hip/thigh for about 48 hours.  I can go up/down stairs with no problem.    How long can you stand comfortably? 1.5 hours, used to do 12 hours    Currently in Pain? Yes    Pain Score 1     Pain Location Back    Pain Orientation Right;Left    Pain Radiating Towards left > right                              [facility] Adult PT Treatment/Exercise - 07/26/19 0001      Lumbar Exercises: Aerobic   Elliptical R5 Inc 5 x 7 min   PT present to discuss progress     Lumbar Exercises: Supine   Bent Knee Raise 5 reps    Straight Leg Raises Limitations hold secondary to pain     Isometric Hip Flexion 10 reps      Lumbar Exercises: Prone   Straight Leg Raise 20 reps    Straight Leg Raises Limitations pillow under abdomen-tactile and verbal cues needed for Rt LE alignment      Shoulder Exercises: Supine   Horizontal ABduction Strengthening;20 reps;Theraband    Theraband Level (Shoulder Horizontal ABduction) Level 3 (Green)    Horizontal ABduction Limitations on foam roll    External Rotation Strengthening;Both;20 reps    Theraband Level (Shoulder External Rotation) Level 3 (Green)    External Rotation Limitations on foam roll    Diagonals Strengthening;Both;20 reps    Theraband Level (Shoulder Diagonals) Level 3 (Green)  Diagonals Limitations on foam roll      Shoulder Exercises: Standing   Extension Strengthening;Both;20 reps;Weights    Extension Weight (lbs) 20#    Extension Limitations standing on black pad    Row Weight (lbs) 25#     Row Limitations standing on black pad      Shoulder Exercises: ROM/Strengthening   UBE (Upper Arm Bike) L 2.5  x 6' fwd                  PT Education - 07/26/19 0916    Education Details modified HEP hold supine SLR add hand to knee isometric and bent knee raises;  prone over pillow hip extension    Person(s) Educated Patient    Methods Explanation;Demonstration;Handout    Comprehension Returned demonstration;Verbalized understanding            PT Short Term Goals - 07/16/19 1233      PT SHORT TERM GOAL #2   Title report a 30% reduction in Lt hip and back pain with sitting and standing    Baseline 50%    Status Achieved             PT Long Term Goals - 07/23/19 1027      PT LONG TERM GOAL #3    Title report a 60% reduction in Lt hip and back pain with sitting and standing    Baseline 65%    Status Achieved      PT LONG TERM GOAL #5   Title able to go up and down stairs with >/= 75% reduction in pain due to improve strength of her hips and core    Time 12    Period Weeks    Status On-going                 Plan - 07/26/19 0902    Clinical Impression Statement The patient is progressing well with lumbo/pelvic/hip core strengthening.  She is highly compliant with her HEP.  She reports her only difficulty is increased hip pain lasting 48 hours with supine SLR.  Recommended modification of bent knee raise and lowers.  She has some difficulty maintaining abdominal brace with bent knee lowers resulting in low back discomfort.  No pain with bent knee isometrics.  She is able to continue with core challenges in standing on a soft surface with min cues only needed to decrease shoulder shrug compensation.  Good response with aerobic conditioning.  Pain level remains very low during and following treatment session.    Rehab Potential Excellent    PT Duration 12 weeks    PT Treatment/Interventions Cryotherapy;Electrical Stimulation;Iontophoresis 4mg /ml Dexamethasone;Moist Heat;Traction;Ultrasound;Neuromuscular re-education;Therapeutic exercise;Therapeutic activities;Patient/family education;Manual techniques;Dry needling;Taping;Spinal Manipulations;Joint Manipulations    PT Next Visit Plan core strength, flexibility    PT Home Exercise Plan Access Code: [place]           Patient will benefit from skilled therapeutic intervention in order to improve the following deficits and impairments:  Decreased range of motion, Increased fascial restricitons, Decreased endurance, Decreased activity tolerance, Pain, Decreased mobility, Decreased strength  Visit Diagnosis: Muscle weakness (generalized)  Acute bilateral low back pain without sciatica  Cramp and spasm     Problem List Patient  Active Problem List   Diagnosis Date Noted  . 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 Hypertension 05/03/2016  . Low back pain 06/16/2014  . Anemia due to other cause 03/24/2014  . Routine general medical examination at a health care facility 03/24/2014  .  VSD (ventricular septal defect) 03/09/2011  . URTICARIA 06/03/2008  . SEBACEOUS CYST 10/09/2007  . Asthma 10/31/2006   Lavinia Sharps, PT 07/26/19 10:32 AM Phone: [phone] Fax: [phone] Vivien Presto 07/26/2019, 10:31 AM  [facility] [address] [place], [place], [address] Phone: [phone]   Fax:  [phone]  Name: Cybele Maule MRN: [id number] Date of Birth: 06-24-1970

## 2019-07-31 ENCOUNTER — Other Ambulatory Visit: Payer: Self-pay

## 2019-07-31 ENCOUNTER — Ambulatory Visit: Payer: BC Managed Care – PPO | Admitting: Physical Therapy

## 2019-07-31 ENCOUNTER — Encounter: Payer: Self-pay | Admitting: Physical Therapy

## 2019-07-31 DIAGNOSIS — M545 Low back pain, unspecified: Secondary | ICD-10-CM

## 2019-07-31 DIAGNOSIS — R252 Cramp and spasm: Secondary | ICD-10-CM | POA: Diagnosis not present

## 2019-07-31 DIAGNOSIS — M6281 Muscle weakness (generalized): Secondary | ICD-10-CM

## 2019-07-31 NOTE — Therapy (Signed)
[facility] [address] [place], [place], [address] Phone: [phone]   Fax:  [phone]  Physical Therapy Treatment  Patient Details  Name: Elizabeth Lozano MRN: [id number] Date of Birth: 1970-05-17 Referring Provider (PT): Dr. Limmie Patricia. Philip Aspen   Encounter Date: 07/31/2019   PT End of Session - 07/31/19 1315    Visit Number 16    Date for PT Re-Evaluation 08/22/19    Authorization Type BCBS    PT Start Time 1309    PT Stop Time 1359    PT Time Calculation (min) 50 min    Activity Tolerance Patient tolerated treatment well    Behavior During Therapy WFL for tasks assessed/performed           Past Medical History:  Diagnosis Date  . Allergy    she has seen Dr. Eileen Stanford   . Anemia    long term issues per pt  . Anxiety    due to back pain   . Asthma   . Depression   . Heart murmur   . History of stress test    ETT-echo (9/14):  ST depression noted on ECGs; no wall motion abnormalities at peak exercise on echocardiogram  . VSD (ventricular septal defect)    Cardiac CTA 05/2008: Membranous VSD, 8-9 mm in diameter, mild RVE, EF 64%, calcium score 0, no CAD. Last echo 02/2011: EF 55-60%, mild CAD, perimembranous VSD slightly more prominent but no RVE or pulmonary hypertension;  Echo (9/14): Small perimembranous VSD-no significant change since 02/2011; EF 55-60%, moderate LAE    Past Surgical History:  Procedure Laterality Date  . cyst left wrist    . dental implants    . WISDOM TOOTH EXTRACTION      There were no vitals filed for this visit.   Subjective Assessment - 07/31/19 1316    Subjective I had a really bad day yesterday.  I pushed it this weekend and felt things got really sore    Patient Stated Goals standing, sitting, return to daily tasks    Currently in Pain? No/denies                             [facility] Adult PT Treatment/Exercise - 07/31/19 0001      Self-Care    Other Self-Care Comments  MFR with spike roller on feet and calves; foam mat to stand on at desk fwd/back rocking to activate core      Lumbar Exercises: Aerobic   Elliptical R5 Inc 5 x 7 min   PT present to discuss progress     Lumbar Exercises: Supine   Bent Knee Raise 5 reps    Straight Leg Raises Limitations hold secondary to pain     Isometric Hip Flexion 10 reps      Lumbar Exercises: Prone   Straight Leg Raise 20 reps    Straight Leg Raises Limitations pillow under abdomen-tactile and verbal cues needed for Rt LE alignment      Lumbar Exercises: Quadruped   Madcat/Old Horse 10 reps      Shoulder Exercises: Supine   Horizontal ABduction Strengthening;20 reps;Theraband    Theraband Level (Shoulder Horizontal ABduction) Level 4 (Blue)    Horizontal ABduction Limitations on foam roll    External Rotation Strengthening;Both;20 reps    Theraband Level (Shoulder External Rotation) Level 4 (Blue)    External Rotation Limitations on foam roll    Diagonals  Strengthening;Both;20 reps    Theraband Level (Shoulder Diagonals) Level 4 (Blue)    Diagonals Limitations on foam roll      Shoulder Exercises: Standing   Extension Strengthening;Both;20 reps;Weights    Extension Weight (lbs) 20#    Extension Limitations standing on black pad    Row Weight (lbs) 25#     Row Limitations standing on black pad      Shoulder Exercises: ROM/Strengthening   UBE (Upper Arm Bike) L 2.5  x 6' fwd                  PT Education - 07/31/19 1357    Education Details cat cow and SKTC    Person(s) Educated Patient    Methods Explanation;Demonstration;Verbal cues;Handout;Tactile cues    Comprehension Verbalized understanding;Returned demonstration            PT Short Term Goals - 07/16/19 1233      PT SHORT TERM GOAL #2   Title report a 30% reduction in Lt hip and back pain with sitting and standing    Baseline 50%    Status Achieved             PT Long Term Goals - 07/23/19 1027       PT LONG TERM GOAL #3   Title report a 60% reduction in Lt hip and back pain with sitting and standing    Baseline 65%    Status Achieved      PT LONG TERM GOAL #5   Title able to go up and down stairs with >/= 75% reduction in pain due to improve strength of her hips and core    Time 12    Period Weeks    Status On-going                 Plan - 07/31/19 1357    Clinical Impression Statement Pt was able to increase band resistance to blue and given a blue band for home use.  She was educated in rolling feet for fascial release from standing, SKTC and cat cow stretches for fascial release throughout her back.  Pt reported feeling better after exercises today.  Continue to work on functional goals as stated.    PT Treatment/Interventions Cryotherapy;Electrical Stimulation;Iontophoresis 4mg /ml Dexamethasone;Moist Heat;Traction;Ultrasound;Neuromuscular re-education;Therapeutic exercise;Therapeutic activities;Patient/family education;Manual techniques;Dry needling;Taping;Spinal Manipulations;Joint Manipulations    PT Next Visit Plan core strength, flexibility, f/u on lumbar stretches, add thoracic extension, add double knee to chest if tolerated    PT Home Exercise Plan Access Code: [email or web address]    Consulted and Agree with Plan of Care Patient           Patient will benefit from skilled therapeutic intervention in order to improve the following deficits and impairments:  Decreased range of motion, Increased fascial restricitons, Decreased endurance, Decreased activity tolerance, Pain, Decreased mobility, Decreased strength  Visit Diagnosis: Muscle weakness (generalized)  Acute bilateral low back pain without sciatica  Cramp and spasm     Problem List Patient Active Problem List   Diagnosis Date Noted  . 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 Hypertension 05/03/2016  . Low back pain 06/16/2014  . Anemia due to other cause 03/24/2014  . Routine general medical examination  at a health care facility 03/24/2014  . VSD (ventricular septal defect) 03/09/2011  . URTICARIA 06/03/2008  . SEBACEOUS CYST 10/09/2007  . Asthma 10/31/2006    Jule Ser, PT 07/31/2019, 3:33 PM  [facility] [address] [place], [place], [address] Phone: [phone]   Fax:  [phone]  Name: Ghazal Pevey MRN: [id number] Date of Birth: Jun 27, 1970

## 2019-07-31 NOTE — Patient Instructions (Signed)
Access Code: [email or web address] URL: [email or web address] Date: 07/31/2019 Prepared by: Dwana Curd  Exercises TL Sidebending Stretch - Arms Overhead - 1 x daily - 7 x weekly - 1 sets - 3 reps - 10-30 hold Half Kneeling Hip Flexor Stretch - 1 x daily - 7 x weekly - 1 sets - 3 reps - 10-30 hold Quadriceps Stretch with Chair - 1 x daily - 7 x weekly - 1 sets - 3 reps - 10-30 hold Seated Trunk Rotation - Arms Crossed - 1 x daily - 7 x weekly - 1 sets - 10 reps - 2-3 hold Seated Thoracic Flexion and Rotation with Swiss Ball - 1 x daily - 7 x weekly - 1 sets - 5 reps - 5-10 hold Thomas Stretch on Table - 1 x daily - 7 x weekly - 1 sets - 1 reps - 20-30 hold Shoulder extension with resistance - Neutral - 1 x daily - 7 x weekly - 2 sets - 10 reps - 5 hold Dead Bug Alternating Arm Extension - 1 x daily - 7 x weekly - 1 sets - 10 reps Supine Chest Stretch on Foam Roll - 1 x daily - 7 x weekly - 1 sets - 1 reps - 30 sec hold Mini Squat/Good Morning - 1 x daily - 7 x weekly - 3 sets - 10 reps Prone Horizontal Abduction with Palms Down - 1 x daily - 7 x weekly - 10 reps - 3 sets Prone Scapular Retraction Y - 1 x daily - 7 x weekly - 10 reps - 3 sets Standing Row with Anchored Resistance - 1 x daily - 7 x weekly - 10 reps - 3 sets Shoulder Extension with Resistance - 1 x daily - 7 x weekly - 10 reps - 3 sets Sidelying Open Book Thoracic Rotation with Knee on Foam Roll - 2 x daily - 7 x weekly - 1 sets - 10 reps Supine Shoulder Horizontal Abduction with Resistance - 2 x daily - 7 x weekly - 10 reps - 2 sets Supine Bilateral Shoulder External Rotation with Resistance - 2 x daily - 7 x weekly - 10 reps - 2 sets Supine PNF D2 Flexion with Resistance - 2 x daily - 7 x weekly - 10 reps - 2 sets Hooklying Isometric Hip Flexion - 1 x daily - 7 x weekly - 1 sets - 10 reps Supine 90/90 Alternating Toe Touch - 1 x daily - 7 x weekly - 1 sets - 10 reps Prone Hip Extension with Pillow Under Abdomen - 1  x daily - 7 x weekly - 2 sets - 10 reps Cat-Camel - 1 x daily - 7 x weekly - 10 reps - 3 sets Hooklying Single Knee to Chest Stretch - 1 x daily - 7 x weekly - 5 reps - 1 sets - 20 sec hold

## 2019-08-06 ENCOUNTER — Encounter: Payer: BC Managed Care – PPO | Admitting: Physical Therapy

## 2019-08-08 ENCOUNTER — Ambulatory Visit: Payer: BC Managed Care – PPO | Admitting: Physical Therapy

## 2019-08-08 ENCOUNTER — Other Ambulatory Visit: Payer: Self-pay

## 2019-08-08 ENCOUNTER — Encounter: Payer: Self-pay | Admitting: Physical Therapy

## 2019-08-08 DIAGNOSIS — M6281 Muscle weakness (generalized): Secondary | ICD-10-CM | POA: Diagnosis not present

## 2019-08-08 DIAGNOSIS — M545 Low back pain, unspecified: Secondary | ICD-10-CM

## 2019-08-08 DIAGNOSIS — R252 Cramp and spasm: Secondary | ICD-10-CM | POA: Diagnosis not present

## 2019-08-08 NOTE — Therapy (Signed)
[facility] [address], [address] [place], [place], [address] Phone: [phone]   Fax:  [phone]  Physical Therapy Treatment  Patient Details  Name: Elizabeth Lozano MRN: [id number] Date of Birth: 1970-04-30 Referring Provider (PT): Dr. Limmie Patricia. Philip Aspen   Encounter Date: 08/08/2019   PT End of Session - 08/08/19 1147    Visit Number 17    Date for PT Re-Evaluation 08/22/19    Authorization Type BCBS    PT Start Time 1101    PT Stop Time 1149    PT Time Calculation (min) 48 min    Activity Tolerance Patient tolerated treatment well    Behavior During Therapy WFL for tasks assessed/performed           Past Medical History:  Diagnosis Date  . Allergy    she has seen Dr. Eileen Stanford   . Anemia    long term issues per pt  . Anxiety    due to back pain   . Asthma   . Depression   . Heart murmur   . History of stress test    ETT-echo (9/14):  ST depression noted on ECGs; no wall motion abnormalities at peak exercise on echocardiogram  . VSD (ventricular septal defect)    Cardiac CTA 05/2008: Membranous VSD, 8-9 mm in diameter, mild RVE, EF 64%, calcium score 0, no CAD. Last echo 02/2011: EF 55-60%, mild CAD, perimembranous VSD slightly more prominent but no RVE or pulmonary hypertension;  Echo (9/14): Small perimembranous VSD-no significant change since 02/2011; EF 55-60%, moderate LAE    Past Surgical History:  Procedure Laterality Date  . cyst left wrist    . dental implants    . WISDOM TOOTH EXTRACTION      There were no vitals filed for this visit.   Subjective Assessment - 08/08/19 1104    Subjective I did a little swimming on Thursday and Friday was painful.  Took a muscle relaxer that night then everything was fine.  I joined a gym and Tuesday I did the eliptical and arm bike.  There was a lot of standing and grocery carrying yesterday.    Currently in Pain? No/denies                              [facility] Adult PT Treatment/Exercise - 08/08/19 0001      Lumbar Exercises: Aerobic   Elliptical R5 Inc 5 x 7 min   PT present to discuss progress     Lumbar Exercises: Standing   Other Standing Lumbar Exercises walking in reverse 25# 2x10    Other Standing Lumbar Exercises SLS hip circles and swinging 45 sec bilat      Lumbar Exercises: Supine   Bent Knee Raise 5 reps    Straight Leg Raises Limitations hold secondary to pain     Isometric Hip Flexion 10 reps      Lumbar Exercises: Prone   Straight Leg Raise 20 reps    Straight Leg Raises Limitations pillow under abdomen-tactile and verbal cues needed for Rt LE alignment      Shoulder Exercises: Supine   Horizontal ABduction Strengthening;20 reps;Theraband    Theraband Level (Shoulder Horizontal ABduction) Level 4 (Blue)    Horizontal ABduction Limitations on foam roll    External Rotation Strengthening;Both;20 reps    Theraband Level (Shoulder External Rotation) Level 4 (Blue)    External Rotation Limitations on foam roll  Diagonals Strengthening;Both;20 reps    Theraband Level (Shoulder Diagonals) Level 4 (Blue)    Diagonals Limitations on foam roll      Shoulder Exercises: Standing   Extension Strengthening;Both;20 reps;Weights    Extension Weight (lbs) 25#    Extension Limitations standing on black pad    Row Strengthening;Both;20 reps;Weights    Row Weight (lbs) 25#     Row Limitations standing on black pad      Shoulder Exercises: ROM/Strengthening   UBE (Upper Arm Bike) L 2.5  x 6' fwd    Other ROM/Strengthening Exercises reverse walking 30# 2x10       Shoulder Exercises: Power Soil scientist Exercises side bending with TFL/IT band stretch at eliptical - 60 sec each side      Manual Therapy   Joint Mobilization PA mobs to sacrum slight rotation to the Left side                    PT Short Term Goals - 07/16/19 1233      PT SHORT TERM GOAL #2   Title  report a 30% reduction in Lt hip and back pain with sitting and standing    Baseline 50%    Status Achieved             PT Long Term Goals - 07/23/19 1027      PT LONG TERM GOAL #3   Title report a 60% reduction in Lt hip and back pain with sitting and standing    Baseline 65%    Status Achieved      PT LONG TERM GOAL #5   Title able to go up and down stairs with >/= 75% reduction in pain due to improve strength of her hips and core    Time 12    Period Weeks    Status On-going                 Plan - 08/08/19 1145    Clinical Impression Statement Pt able to progress resistance.  She is demonstrating improvements overall. Pt felt better hip extension with LE pendulums afte sacral PAs.  pt will benefit from skilled PT to progress strength and mobility for return to function without increased pain during her normal activities.    PT Treatment/Interventions Cryotherapy;Electrical Stimulation;Iontophoresis 4mg /ml Dexamethasone;Moist Heat;Traction;Ultrasound;Neuromuscular re-education;Therapeutic exercise;Therapeutic activities;Patient/family education;Manual techniques;Dry needling;Taping;Spinal Manipulations;Joint Manipulations    PT Next Visit Plan core strength, flexibility, f/u on lumbar stretches, add thoracic extension, add double knee to chest if tolerated    PT Home Exercise Plan Access Code: [email or web address]    Consulted and Agree with Plan of Care Patient           Patient will benefit from skilled therapeutic intervention in order to improve the following deficits and impairments:  Decreased range of motion, Increased fascial restricitons, Decreased endurance, Decreased activity tolerance, Pain, Decreased mobility, Decreased strength  Visit Diagnosis: Muscle weakness (generalized)  Acute bilateral low back pain without sciatica  Cramp and spasm     Problem List Patient Active Problem List   Diagnosis Date Noted  . 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  Hypertension 05/03/2016  . Low back pain 06/16/2014  . Anemia due to other cause 03/24/2014  . Routine general medical examination at a health care facility 03/24/2014  . VSD (ventricular septal defect) 03/09/2011  . URTICARIA 06/03/2008  . SEBACEOUS CYST 10/09/2007  . Asthma 10/31/2006    11/02/2006, PT 08/08/2019,  11:48 AM  [facility] [address], [place], [place], [address] Phone: [phone]   Fax:  [phone]  Name: Zavannah Deblois MRN: [id number] Date of Birth: [birth year]

## 2019-08-09 ENCOUNTER — Encounter: Payer: Self-pay | Admitting: Gastroenterology

## 2019-08-13 ENCOUNTER — Other Ambulatory Visit: Payer: Self-pay

## 2019-08-13 ENCOUNTER — Ambulatory Visit: Payer: BC Managed Care – PPO | Admitting: Physical Therapy

## 2019-08-13 ENCOUNTER — Encounter: Payer: Self-pay | Admitting: Physical Therapy

## 2019-08-13 DIAGNOSIS — M545 Low back pain, unspecified: Secondary | ICD-10-CM

## 2019-08-13 DIAGNOSIS — R252 Cramp and spasm: Secondary | ICD-10-CM

## 2019-08-13 DIAGNOSIS — M6281 Muscle weakness (generalized): Secondary | ICD-10-CM

## 2019-08-13 NOTE — Therapy (Signed)
[facility] [address] [place], [place], [address] Phone: [phone]   Fax:  [phone]  Physical Therapy Treatment  Patient Details  Name: Elizabeth Lozano MRN: [id number] Date of Birth: 1970-09-16 Referring Provider (PT): Dr. Rayford Halsted. Isaac Bliss   Encounter Date: 08/13/2019   PT End of Session - 08/13/19 0847    Visit Number 18    Date for PT Re-Evaluation 08/22/19    Authorization Type [employer]    PT Start Time [phone]    PT Stop Time 0930    PT Time Calculation (min) 43 min    Activity Tolerance Patient tolerated treatment well    Behavior During Therapy WFL for tasks assessed/performed           Past Medical History:  Diagnosis Date   Allergy    she has seen Dr. Tiajuana Amass    Anemia    long term issues per pt   Anxiety    due to back pain    Asthma    Depression    Heart murmur    History of stress test    ETT-echo (9/14):  ST depression noted on ECGs; no wall motion abnormalities at peak exercise on echocardiogram   VSD (ventricular septal defect)    Cardiac CTA 05/2008: Membranous VSD, 8-9 mm in diameter, mild RVE, EF 64%, calcium score 0, no CAD. Last echo 02/2011: EF 55-60%, mild CAD, perimembranous VSD slightly more prominent but no RVE or pulmonary hypertension;  Echo (9/14): Small perimembranous VSD-no significant change since 02/2011; EF 55-60%, moderate LAE    Past Surgical History:  Procedure Laterality Date   cyst left wrist     dental implants     WISDOM TOOTH EXTRACTION      There were no vitals filed for this visit.   Subjective Assessment - 08/13/19 0848    Subjective Patient went to the [place] and sat for 15 min comfortably. She stood yesterday for 2 hours (not consecutively) but tolerated well. She had some tightness in her left hip and was able to release it later with massage. Able to load dishwasher without pain and carry full stack of plates when unloading without pain.     How long can you sit comfortably? 15 minutes    Currently in Pain? Yes    Pain Score 1     Pain Location Back    Pain Orientation Left    Pain Descriptors / Indicators Tightness                             [facility] Adult PT Treatment/Exercise - 08/13/19 0001      Lumbar Exercises: Aerobic   Elliptical R5 Inc 5 x 7 min   PT present to discuss progress     Lumbar Exercises: Machines for Strengthening   Leg Press 60# x 10 bil; then unilateral x 5 each    Other Lumbar Machine Exercise 30# chest press x 5; lat pull 35# x 10      Lumbar Exercises: Sidelying   Other Sidelying Lumbar Exercises side plank right side 2 reps; then modified plank on bent knee with leg lift      Lumbar Exercises: Quadruped   Straight Leg Raise 10 reps   bil   Opposite Arm/Leg Raise Right arm/Left leg;Left arm/Right leg;5 reps    Opposite Arm/Leg Raise Limitations more challenging with left arm/right leg      Shoulder  Exercises: Standing   Other Standing Exercises 6# biceps curls for core engagement; also with single leg stance      Shoulder Exercises: ROM/Strengthening   UBE (Upper Arm Bike) L3 x 6 min fwd                    PT Short Term Goals - 07/16/19 1233      PT SHORT TERM GOAL #2   Title report a 30% reduction in Lt hip and back pain with sitting and standing    Baseline 50%    Status Achieved             PT Long Term Goals - 08/13/19 0857      PT LONG TERM GOAL #1   Title be independent in advanced HEP    Status Achieved      PT LONG TERM GOAL #3   Title report a 60% reduction in Lt hip and back pain with sitting and standing    Status Achieved      PT LONG TERM GOAL #4   Title able to perfrom her usual daily activities with pain decreased >/= 75%    Baseline Patient reports 95% improvement in pain. Intermittent pain with vacuuming. Can carry dog and leash dog without pain.    Status Achieved      PT LONG TERM GOAL #5   Title able to go up and down  stairs with >/= 75% reduction in pain due to improve strength of her hips and core    Baseline no problem with stairs    Status Achieved                 Plan - 08/13/19 0929    Clinical Impression Statement Patient has met her LTGs  and is ready for discharge. FOTO to be completed at last visit. She has joined a gym and we discussed how and when to progress weights. We worked on machines that she has at the gym and how to engage core with various exercises. We re-introduced side plank and quadriped stabilization and these should be reviewed and added to HEP at last visit. Patient is pleased with current functional level.    PT Treatment/Interventions Cryotherapy;Electrical Stimulation;Iontophoresis 34m/ml Dexamethasone;Moist Heat;Traction;Ultrasound;Neuromuscular re-education;Therapeutic exercise;Therapeutic activities;Patient/family education;Manual techniques;Dry needling;Taping;Spinal Manipulations;Joint Manipulations    PT Next Visit Plan Complete FOTO; add side plank and quad stab to HEP; D/C to HEP    PT Home Exercise Plan Access Code: [facility]          Patient will benefit from skilled therapeutic intervention in order to improve the following deficits and impairments:  Decreased range of motion, Increased fascial restricitons, Decreased endurance, Decreased activity tolerance, Pain, Decreased mobility, Decreased strength  Visit Diagnosis: Muscle weakness (generalized)  Acute bilateral low back pain without sciatica  Cramp and spasm     Problem List Patient Active Problem List   Diagnosis Date Noted   Low blood monocyte count 07/06/2016   Hypertension 05/03/2016   Low back pain 06/16/2014   Anemia due to other cause 03/24/2014   Routine general medical examination at a health care facility 03/24/2014   VSD (ventricular septal defect) 03/09/2011   URTICARIA 06/03/2008   SEBACEOUS CYST 10/09/2007   Asthma 10/31/2006    [name] 08/13/2019, 9:40  AM  [facility] [address] [place] [address]Phone: [phone]  Fax:  [phone] Name: Elizabeth FredricksenMRN: [id number] of  Birth: [birth year]

## 2019-08-15 ENCOUNTER — Other Ambulatory Visit: Payer: Self-pay

## 2019-08-15 ENCOUNTER — Ambulatory Visit: Payer: BC Managed Care – PPO | Attending: Internal Medicine

## 2019-08-15 DIAGNOSIS — R252 Cramp and spasm: Secondary | ICD-10-CM | POA: Diagnosis not present

## 2019-08-15 DIAGNOSIS — M545 Low back pain, unspecified: Secondary | ICD-10-CM

## 2019-08-15 DIAGNOSIS — M6281 Muscle weakness (generalized): Secondary | ICD-10-CM | POA: Diagnosis not present

## 2019-08-15 NOTE — Therapy (Signed)
[facility] [address], [place], [place], [address] Phone: [phone]   Fax:  [phone]  Physical Therapy Treatment  Patient Details  Name: Elizabeth Lozano MRN: [id number] Date of Birth: 1970/04/13 Referring Provider (PT): Dr. Rayford Halsted. Isaac Bliss   Encounter Date: 08/15/2019   PT End of Session - 08/15/19 1009    Visit Number 57    PT Start Time 0932    PT Stop Time 3300    PT Time Calculation (min) 43 min    Activity Tolerance Patient tolerated treatment well    Behavior During Therapy WFL for tasks assessed/performed           Past Medical History:  Diagnosis Date  . Allergy    she has seen Dr. Tiajuana Amass   . Anemia    long term issues per pt  . Anxiety    due to back pain   . Asthma   . Depression   . Heart murmur   . History of stress test    ETT-echo (9/14):  ST depression noted on ECGs; no wall motion abnormalities at peak exercise on echocardiogram  . VSD (ventricular septal defect)    Cardiac CTA 05/2008: Membranous VSD, 8-9 mm in diameter, mild RVE, EF 64%, calcium score 0, no CAD. Last echo 02/2011: EF 55-60%, mild CAD, perimembranous VSD slightly more prominent but no RVE or pulmonary hypertension;  Echo (9/14): Small perimembranous VSD-no significant change since 02/2011; EF 55-60%, moderate LAE    Past Surgical History:  Procedure Laterality Date  . cyst left wrist    . dental implants    . WISDOM TOOTH EXTRACTION      There were no vitals filed for this visit.   Subjective Assessment - 08/15/19 0933    Subjective 90% overall improvement in symptoms since the start of care.  I did 2, 30 minute car rides yesterday without any problem.    Currently in Pain? No/denies              [facility] PT Assessment - 08/15/19 0001      Assessment   Medical Diagnosis M54.5; G89.29 Chronic bilateral low back pain without sciatica    Referring Provider (PT) Dr. Rayford Halsted. Isaac Bliss    Onset  Date/Surgical Date 03/18/19      Cognition   Overall Cognitive Status Within Functional Limits for tasks assessed      Observation/Other Assessments   Focus on Therapeutic Outcomes (FOTO)  37% limitation      Strength   Right Hip ABduction 4/5    Left Hip ABduction 4+/5                         [facility] Adult PT Treatment/Exercise - 08/15/19 0001      Lumbar Exercises: Aerobic   Elliptical R5 Inc 5 x 7 min   PT present to discuss progress     Lumbar Exercises: Machines for Strengthening   Leg Press 60# x 10 bil; then unilateral x 5 each    Other Lumbar Machine Exercise 30# chest press x 5; lat pull 35# x 10      Lumbar Exercises: Sidelying   Other Sidelying Lumbar Exercises side plank right side 2 reps; then modified plank on bent knee with leg lift      Lumbar Exercises: Quadruped   Straight Leg Raise 10 reps   bil   Opposite Arm/Leg Raise Right arm/Left leg;Left arm/Right leg;5  reps    Opposite Arm/Leg Raise Limitations more challenging with left arm/right leg      Shoulder Exercises: Standing   Other Standing Exercises 6# biceps curls for core engagement; also with single leg stance      Shoulder Exercises: ROM/Strengthening   UBE (Upper Arm Bike) L3 x 6 min fwd                    PT Short Term Goals - 07/16/19 1233      PT SHORT TERM GOAL #2   Title report a 30% reduction in Lt hip and back pain with sitting and standing    Baseline 50%    Status Achieved             PT Long Term Goals - 08/15/19 0934      PT LONG TERM GOAL #1   Title be independent in advanced HEP    Status Achieved      PT LONG TERM GOAL #2   Title reduce FOTO to < or = to 33% limitation    Baseline 37% limitation    Status Partially Met      PT LONG TERM GOAL #3   Title report a 60% reduction in Lt hip and back pain with sitting and standing    Baseline 90%    Status Achieved      PT LONG TERM GOAL #4   Title able to perfrom her usual daily activities with  pain decreased >/= 75%    Baseline 90% overall      PT LONG TERM GOAL #5   Title able to go up and down stairs with >/= 75% reduction in pain due to improve strength of her hips and core    Baseline no problem with stairs    Status Achieved                 Plan - 08/15/19 1006    Clinical Impression Statement Pt reports 90% overall improvement in symptoms and is ready to D/C to HEP and gym program.  PT has met all goals.  Lt hip abduction is 4+/5 and FOTO is now 37% improvement.  Pt is able to sleep without pain medication.  Pt is able to ride in the car/drive x 30 minutes without limitation now.  Pt is working toward longer periods of time.  Pt will D/C to HEP today.    PT Next Visit Plan D/C HEP    PT Home Exercise Plan Access Code: [email or web address]    Consulted and Agree with Plan of Care Patient           Patient will benefit from skilled therapeutic intervention in order to improve the following deficits and impairments:     Visit Diagnosis: Acute bilateral low back pain without sciatica  Muscle weakness (generalized)  Cramp and spasm     Problem List Patient Active Problem List   Diagnosis Date Noted  . 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 Hypertension 05/03/2016  . Low back pain 06/16/2014  . Anemia due to other cause 03/24/2014  . Routine general medical examination at a health care facility 03/24/2014  . VSD (ventricular septal defect) 03/09/2011  . URTICARIA 06/03/2008  . SEBACEOUS CYST 10/09/2007  . Asthma 10/31/2006   PHYSICAL THERAPY DISCHARGE SUMMARY  Visits from Start of Care: 19  Current functional level related to goals / functional outcomes: See above for current status.    Remaining deficits: Lt SI joint pain/instability.  Pt has HEP in place to address remaining deficits.     Education / Equipment: HEP, [employer], gym exercises Plan: Patient agrees to discharge.  Patient goals were met. Patient is being discharged due to  meeting the stated rehab goals.  ?????       Sigurd Sos, PT 08/15/19 10:12 AM   [facility] [address], [place], [place], [address] Phone: [phone]   Fax:  [phone]  Name: Elizabeth Lozano MRN: [id number] Date of Birth: [birth year]

## 2019-08-20 ENCOUNTER — Ambulatory Visit: Payer: BC Managed Care – PPO | Admitting: Orthopaedic Surgery

## 2019-08-26 ENCOUNTER — Encounter: Payer: BC Managed Care – PPO | Admitting: Gastroenterology

## 2019-09-02 DIAGNOSIS — Z1231 Encounter for screening mammogram for malignant neoplasm of breast: Secondary | ICD-10-CM | POA: Diagnosis not present

## 2019-09-04 ENCOUNTER — Encounter: Payer: Self-pay | Admitting: Gastroenterology

## 2019-09-04 ENCOUNTER — Ambulatory Visit (AMBULATORY_SURGERY_CENTER): Payer: BC Managed Care – PPO | Admitting: Gastroenterology

## 2019-09-04 VITALS — BP 131/76 | HR 62 | Temp 96.8°F | Resp 13 | Ht 66.0 in | Wt 140.0 lb

## 2019-09-04 DIAGNOSIS — Z1211 Encounter for screening for malignant neoplasm of colon: Secondary | ICD-10-CM

## 2019-09-04 MED ORDER — SODIUM CHLORIDE 0.9 % IV SOLN: 500.0000 mL | INTRAVENOUS | Status: DC

## 2019-09-04 NOTE — Progress Notes (Signed)
To PACU, VSS. Report to [email or web address] 

## 2019-09-04 NOTE — Progress Notes (Signed)
cw VS  

## 2019-09-04 NOTE — Op Note (Signed)
[facility] Patient Name: Elizabeth Lozano Procedure Date: 09/04/2019 8:27 AM MRN: [id number] Endoscopist: Meryl Dare , MD Age: 49 Referring MD:  Date of Birth: 13-Jul-1970 Gender: Female Account #: [id number] Procedure:                Colonoscopy Indications:              Screening for colorectal malignant neoplasm Medicines:                Monitored Anesthesia Care Procedure:                Pre-Anesthesia Assessment:                           - Prior to the procedure, a History and Physical                            was performed, and patient medications and                            allergies were reviewed. The patient's tolerance of                            previous anesthesia was also reviewed. The risks                            and benefits of the procedure and the sedation                            options and risks were discussed with the patient.                            All questions were answered, and informed consent                            was obtained. Prior Anticoagulants: The patient has                            taken no previous anticoagulant or antiplatelet                            agents. ASA Grade Assessment: II - A patient with                            mild systemic disease. After reviewing the risks                            and benefits, the patient was deemed in                            satisfactory condition to undergo the procedure.                           After obtaining informed consent, the colonoscope  was passed under direct vision. Throughout the                            procedure, the patient's blood pressure, pulse, and                            oxygen saturations were monitored continuously. The                            Colonoscope was introduced through the anus and                            advanced to the the cecum, identified by                            appendiceal orifice and  ileocecal valve. The                            ileocecal valve, appendiceal orifice, and rectum                            were photographed. The quality of the bowel                            preparation was excellent. The colonoscopy was                            performed without difficulty. The patient tolerated                            the procedure well. Scope In: 8:40:59 AM Scope Out: 8:55:07 AM Scope Withdrawal Time: 0 hours 9 minutes 38 seconds  Total Procedure Duration: 0 hours 14 minutes 8 seconds  Findings:                 The perianal and digital rectal examinations were                            normal.                           Internal hemorrhoids were found during                            retroflexion. The hemorrhoids were small and Grade                            I (internal hemorrhoids that do not prolapse).                           The exam was otherwise without abnormality on                            direct and retroflexion views. Complications:            No immediate complications. Estimated blood loss:  None. Estimated Blood Loss:     Estimated blood loss: none. Impression:               - Internal hemorrhoids.                           - The examination was otherwise normal on direct                            and retroflexion views.                           - No specimens collected. Recommendation:           - Repeat colonoscopy in 10 years for screening                            purposes.                           - Patient has a contact number available for                            emergencies. The signs and symptoms of potential                            delayed complications were discussed with the                            patient. Return to normal activities tomorrow.                            Written discharge instructions were provided to the                            patient.                           -  Resume previous diet.                           - Continue present medications. Meryl Dare, MD 09/04/2019 8:57:09 AM This report has been signed electronically.

## 2019-09-04 NOTE — Patient Instructions (Signed)
Handout given for hemorrhoids.  You don't need another colonoscopy for 10 years!   YOU HAD AN ENDOSCOPIC PROCEDURE TODAY AT THE [facility]:   Refer to the procedure report that was given to you for any specific questions about what was found during the examination.  If the procedure report does not answer your questions, please call your gastroenterologist to clarify.  If you requested that your care partner not be given the details of your procedure findings, then the procedure report has been included in a sealed envelope for you to review at your convenience later.  YOU SHOULD EXPECT: Some feelings of bloating in the abdomen. Passage of more gas than usual.  Walking can help get rid of the air that was put into your GI tract during the procedure and reduce the bloating. If you had a lower endoscopy (such as a colonoscopy or flexible sigmoidoscopy) you may notice spotting of blood in your stool or on the toilet paper. If you underwent a bowel prep for your procedure, you may not have a normal bowel movement for a few days.  Please Note:  You might notice some irritation and congestion in your nose or some drainage.  This is from the oxygen used during your procedure.  There is no need for concern and it should clear up in a day or so.  SYMPTOMS TO REPORT IMMEDIATELY:   Following lower endoscopy (colonoscopy or flexible sigmoidoscopy):  Excessive amounts of blood in the stool  Significant tenderness or worsening of abdominal pains  Swelling of the abdomen that is new, acute  Fever of 100F or higher  For urgent or emergent issues, a gastroenterologist can be reached at any hour by calling [phone]. Do not use MyChart messaging for urgent concerns.    DIET:  We do recommend a small meal at first, but then you may proceed to your regular diet.  Drink plenty of fluids but you should avoid alcoholic beverages for 24 hours.  ACTIVITY:  You should plan to take it easy for the  rest of today and you should NOT DRIVE or use heavy machinery until tomorrow (because of the sedation medicines used during the test).    FOLLOW UP: Our staff will call the number listed on your records 48-72 hours following your procedure to check on you and address any questions or concerns that you may have regarding the information given to you following your procedure. If we do not reach you, we will leave a message.  We will attempt to reach you two times.  During this call, we will ask if you have developed any symptoms of COVID 19. If you develop any symptoms (ie: fever, flu-like symptoms, shortness of breath, cough etc.) before then, please call [phone].  If you test positive for Covid 19 in the 2 weeks post procedure, please call and report this information to us.    If any biopsies were taken you will be contacted by phone or by letter within the next 1-3 weeks.  Please call us at [phone] if you have not heard about the biopsies in 3 weeks.    SIGNATURES/CONFIDENTIALITY: You and/or your care partner have signed paperwork which will be entered into your electronic medical record.  These signatures attest to the fact that that the information above on your After Visit Summary has been reviewed and is understood.  Full responsibility of the confidentiality of this discharge information lies with you and/or your care-partner. 

## 2019-09-05 ENCOUNTER — Other Ambulatory Visit: Payer: Self-pay | Admitting: Obstetrics and Gynecology

## 2019-09-05 DIAGNOSIS — R928 Other abnormal and inconclusive findings on diagnostic imaging of breast: Secondary | ICD-10-CM

## 2019-09-06 ENCOUNTER — Telehealth: Payer: Self-pay | Admitting: *Deleted

## 2019-09-06 NOTE — Telephone Encounter (Signed)
°  Follow up Call-  Call back number 09/04/2019  Post procedure Call Back phone  # [phone]  Permission to leave phone message Yes  Some recent data might be hidden     Patient questions:  Do you have a fever, pain , or abdominal swelling? No. Pain Score  0 *  Have you tolerated food without any problems? Yes.    Have you been able to return to your normal activities? Yes.    Do you have any questions about your discharge instructions: Diet   No. Medications  No. Follow up visit  No.  Do you have questions or concerns about your Care? No.  Actions: * If pain score is 4 or above: No action needed, pain <4.  1. Have you developed a fever since your procedure? no  2.   Have you had an respiratory symptoms (SOB or cough) since your procedure? no  3.   Have you tested positive for COVID 19 since your procedure no  4.   Have you had any family members/close contacts diagnosed with the COVID 19 since your procedure?  no   If yes to any of these questions please route to Laverna Peace, RN and Charlett Lango, RN

## 2019-09-17 ENCOUNTER — Other Ambulatory Visit: Payer: Self-pay

## 2019-09-17 ENCOUNTER — Ambulatory Visit: Payer: BC Managed Care – PPO

## 2019-09-17 ENCOUNTER — Ambulatory Visit
Admission: RE | Admit: 2019-09-17 | Discharge: 2019-09-17 | Disposition: A | Discharge status: Home / Self Care | Payer: BC Managed Care – PPO | Source: Ambulatory Visit | Attending: Obstetrics and Gynecology | Admitting: Obstetrics and Gynecology

## 2019-09-17 DIAGNOSIS — R922 Inconclusive mammogram: Secondary | ICD-10-CM | POA: Diagnosis not present

## 2019-09-17 DIAGNOSIS — R928 Other abnormal and inconclusive findings on diagnostic imaging of breast: Secondary | ICD-10-CM

## 2019-11-04 ENCOUNTER — Encounter: Payer: Self-pay | Admitting: Internal Medicine

## 2019-11-04 DIAGNOSIS — M545 Low back pain, unspecified: Secondary | ICD-10-CM

## 2019-11-27 ENCOUNTER — Other Ambulatory Visit: Payer: Self-pay

## 2019-11-27 ENCOUNTER — Ambulatory Visit: Payer: BC Managed Care – PPO | Attending: Internal Medicine | Admitting: Physical Therapy

## 2019-11-27 ENCOUNTER — Encounter: Payer: Self-pay | Admitting: Physical Therapy

## 2019-11-27 DIAGNOSIS — M545 Low back pain, unspecified: Secondary | ICD-10-CM | POA: Insufficient documentation

## 2019-11-27 DIAGNOSIS — M6281 Muscle weakness (generalized): Secondary | ICD-10-CM | POA: Insufficient documentation

## 2019-11-27 DIAGNOSIS — R252 Cramp and spasm: Secondary | ICD-10-CM | POA: Diagnosis not present

## 2019-11-27 NOTE — Patient Instructions (Signed)
Access Code: [facility] URL: [email or web address] Date: 11/27/2019 Prepared by: Loistine Simas Remiel Corti  Exercises Standing Hip Flexor Stretch - 1 x daily - 7 x weekly - 1 sets - 3 reps - 10 hold Side Lunge Adductor Stretch - 1 x daily - 7 x weekly - 1 sets - 3 reps - 10 hold Standing Quadriceps Stretch - 1 x daily - 7 x weekly - 1 sets - 3 reps - 10 hold Seated Figure 4 Piriformis Stretch - 1 x daily - 7 x weekly - 3 sets - 2 reps - 30 hold Supine Figure 4 Piriformis Stretch - 1 x daily - 7 x weekly - 3 sets - 2 reps - 30 hold

## 2019-11-27 NOTE — Therapy (Signed)
[facility] [address] [place], [place], [address] Phone: [phone]   Fax:  [phone]  Physical Therapy Evaluation  Patient Details  Name: Elizabeth Lozano MRN: [id number] Date of Birth: 1970-10-07 Referring Provider (PT): Philip Aspen, Limmie Patricia, MD   Encounter Date: 11/27/2019   PT End of Session - 11/27/19 0928    Visit Number 1    Number of Visits 11    Date for PT Re-Evaluation 01/22/20    Authorization Type [employer]    Authorization Time Period 11 visits left out of 30 for the year - hard stop    Authorization - Visit Number 1    Authorization - Number of Visits 11    PT Start Time 0930    PT Stop Time 1015    PT Time Calculation (min) 45 min    Activity Tolerance Patient tolerated treatment well    Behavior During Therapy [facility] for tasks assessed/performed           Past Medical History:  Diagnosis Date  . Allergy    she has seen Dr. Eileen Stanford   . Anemia    long term issues per pt  . Anxiety    due to back pain   . Asthma   . Depression   . Heart murmur   . History of stress test    ETT-echo (9/14):  ST depression noted on ECGs; no wall motion abnormalities at peak exercise on echocardiogram  . Hypertension   . VSD (ventricular septal defect)    Cardiac CTA 05/2008: Membranous VSD, 8-9 mm in diameter, mild RVE, EF 64%, calcium score 0, no CAD. Last echo 02/2011: EF 55-60%, mild CAD, perimembranous VSD slightly more prominent but no RVE or pulmonary hypertension;  Echo (9/14): Small perimembranous VSD-no significant change since 02/2011; EF 55-60%, moderate LAE    Past Surgical History:  Procedure Laterality Date  . cyst left wrist    . dental implants    . WISDOM TOOTH EXTRACTION      There were no vitals filed for this visit.    Subjective Assessment - 11/27/19 0929    Subjective Previous Pt returns with report of flare up on Sept 1 when I progressed to side planks for my HEP.  Entire left  side from shoulder down got flared.  I have fixed most of it with ice and slow return to some of HEP but what's left is cramping in front of Lt hip and groin in sitting and some QL tension - seems like my psoas.    Pertinent History scoliosis, easily flares up    Limitations Sitting;Walking    How long can you sit comfortably? maybe 15 min    How long can you walk comfortably? walks dog x 15-20 min with pain    Patient Stated Goals get back to being able to sleep through night, sit x 45 min, drive for errands without pain, do HEP with tolerance    Currently in Pain? Yes    Pain Score 4    can reach 9/10   Pain Location Hip    Pain Orientation Anterior;Left    Pain Descriptors / Indicators Tightness;Cramping    Pain Type Acute pain;Chronic pain    Pain Radiating Towards Lt lumbar/QL    Pain Onset More than a month ago    Pain Frequency Constant    Aggravating Factors  sitting, walking    Effect of Pain on Daily Activities pain with all  activity              [facility] PT Assessment - 11/27/19 0001      Assessment   Medical Diagnosis M54.5 (ICD-10-CM) - Left-sided low back pain without sciatica, unspecified chronicity    Referring Provider (PT) Philip Aspen, Limmie Patricia, MD    Onset Date/Surgical Date --   years ago, flared up doing side planks   Next MD Visit physical on Fri    Prior Therapy yes      Precautions   Precaution Comments Pt sensitive to physical flare ups       Restrictions   Weight Bearing Restrictions No      Balance Screen   Has the patient fallen in the past 6 months No      Prior Function   Level of Independence Independent    Vocation Unemployed    Leisure limited by pain, TV      Cognition   Overall Cognitive Status Within Functional Limits for tasks assessed      Observation/Other Assessments   Observations Pt stands and rubs Lt hemipelvis anterior/posterior throughout history taking    Focus on Therapeutic Outcomes (FOTO)  49% goal 31%       Functional Tests   Functional tests Single leg stance;Squat      Squat   Comments able to perform symmetrical squat without pain      Single Leg Stance   Comments able to balance in SLS bil      Posture/Postural Control   Posture/Postural Control Postural limitations    Postural Limitations Decreased lumbar lordosis      ROM / Strength   AROM / PROM / Strength AROM;Strength      AROM   AROM Assessment Site Lumbar;Hip    Right/Left Hip Left    Left Hip External Rotation  50    Lumbar Flexion 50, walks hands up thighs on return, knees bent with flexion    Lumbar Extension 10, no pain    Lumbar - Right Side Bend 10 Lt trunk soft tissue stretch    Lumbar - Left Side Bend 15    Lumbar - Right Rotation limited non-painful    Lumbar - Left Rotation WNL      Strength   Strength Assessment Site Hip    Right/Left Hip Left    Left Hip Flexion 5/5    Left Hip Extension 5/5    Left Hip External Rotation 4/5    Left Hip Internal Rotation 5/5    Left Hip ABduction 4-/5    Left Hip ADduction 5/5      Flexibility   Soft Tissue Assessment /Muscle Length yes   hip flexors and adductors on Lt x 25%   Quadriceps limited on Lt 20%      Palpation   Palpation comment Tender: Lt hip proximal quads, proximal adductors, psoas, glut med, distal ITB on Lt, Lt lumbar paraspinal      Special Tests   Other special tests Lt hip special tests negative      Ambulation/Gait   Gait Pattern Decreased weight shift to left;Decreased step length - right    Gait Comments limited transverse motion of pelvis                      Objective measurements completed on examination: See above findings.       [facility] Adult PT Treatment/Exercise - 11/27/19 0001      Exercises   Exercises Knee/Hip  Knee/Hip Exercises: Stretches   PharmacologistQuad Stretch Left;30 seconds    Quad Stretch Limitations standing    Hip Flexor Stretch Left;10 seconds    Hip Flexor Stretch Limitations standing lunge, add bil  UE overhead reach with Rt SB x 10 sec    Other Knee/Hip Stretches standing side lunge adductor stretch on Lt 1x20 sec, counter support                  PT Education - 11/27/19 1012    Education Details Access Code: [email or web address]    Person(s) Educated Patient    Methods Explanation;Handout    Comprehension Returned demonstration;Verbalized understanding            PT Short Term Goals - 11/27/19 1327      PT SHORT TERM GOAL #1   Title be independent with initial HEP    Baseline -    Time 3    Period Weeks    Status New    Target Date 12/18/19      PT SHORT TERM GOAL #2   Title report a 30% reduction in Lt hip and back pain with sitting and standing    Baseline -    Time 4    Period Weeks    Status New    Target Date 12/25/19      PT SHORT TERM GOAL #3   Title Pt will achieve Lt hip flexibility to [facility] to reduce strain on Lt hemipelvis    Time 4    Period Weeks    Status New    Target Date 12/25/19      PT SHORT TERM GOAL #4   Title Pt will demo symmetrical trunk SB to 15 deg bil    Time 4    Period Weeks    Status New    Target Date 12/25/19             PT Long Term Goals - 11/27/19 1327      PT LONG TERM GOAL #1   Title be independent in advanced HEP    Time 8    Period Weeks    Status New    Target Date 01/22/20      PT LONG TERM GOAL #2   Title reduce FOTO to < or = to 31% limitation    Baseline 49%    Time 8    Period Weeks    Status New    Target Date 01/22/20      PT LONG TERM GOAL #3   Title Pt will be able to sit for driving and watching TV up to 45 min with min pain    Baseline -    Time 8    Period Weeks    Status New    Target Date 01/22/20      PT LONG TERM GOAL #4   Title Improved sleep by 70%    Baseline -    Time 8    Period Weeks    Status New    Target Date 01/22/20      PT LONG TERM GOAL #5   Title Pt will be able to walk her dog with min pain    Baseline -    Time 8    Period Weeks    Status New    Target  Date 01/22/20                  Plan - 11/27/19 1316    Clinical Impression  Statement Pt is a returning Pt who flared up early Sept when adding side planks to HEP.  She has pain and tightness in Lt anterior hip and groin which is worse with sitting.  She also reports tightness in Lt lumbar region.  She has soft tissue limitations in Lt lumbar and anterior/lateral hip which limit trunk A/ROM Rt SB and hip extension and ER.  Special tests to Lt hip are negative.  SI joints have normal alignment and glide.  She has tightness and tenderness with trigger points in proximal quads, adductors, psoas and distal ITB.  She has painful weakness of Lt hip ER and abduction.  Gait is antalgic on Lt.  FOTO score is 49% limited with goal of 31%.  Pt wishes to improve sitting tolerance x 45 min, driving for errands without pain, be able to walk her dog and sleep more comfortably.  She will benefit from skilled PT to address these findings.    Personal Factors and Comorbidities Time since onset of injury/illness/exacerbation    Examination-Activity Limitations Sit;Stand;Locomotion Level    Examination-Participation Restrictions Community Activity;Driving    Stability/Clinical Decision Making Stable/Uncomplicated    Clinical Decision Making Low    Rehab Potential Excellent    PT Frequency 2x / week   1-2x/week, Pt only has 11 visits left for the year   PT Duration 8 weeks    PT Treatment/Interventions Joint Manipulations;Spinal Manipulations;Taping;Dry needling;Passive range of motion;Manual techniques;Patient/family education;Neuromuscular re-education;Therapeutic exercise;Therapeutic activities;Functional mobility training;Electrical Stimulation;Cryotherapy;Moist Heat    PT Next Visit Plan DN Lt proximal quads and adductors, psoas, distal ITB, check QL and lumbar paraspinals for DN, f/u on hip stretches    PT Home Exercise Plan Access Code: [facility]    Consulted and Agree with Plan of Care Patient            Patient will benefit from skilled therapeutic intervention in order to improve the following deficits and impairments:  Abnormal gait, Decreased range of motion, Postural dysfunction, Decreased strength, Decreased mobility, Impaired flexibility, Increased muscle spasms, Pain  Visit Diagnosis: Acute bilateral low back pain without sciatica - Plan: PT plan of care cert/re-cert  Muscle weakness (generalized) - Plan: PT plan of care cert/re-cert  Cramp and spasm - Plan: PT plan of care cert/re-cert     Problem List Patient Active Problem List   Diagnosis Date Noted  . 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 Hypertension 05/03/2016  . Low back pain 06/16/2014  . Anemia due to other cause 03/24/2014  . Routine general medical examination at a health care facility 03/24/2014  . VSD (ventricular septal defect) 03/09/2011  . URTICARIA 06/03/2008  . SEBACEOUS CYST 10/09/2007  . Asthma 10/31/2006    Morton Peters, PT 11/27/19 1:33 PM   [facility] [address], [address] [place], [place], [address] Phone: [phone]   Fax:  [phone]  Name: Elizabeth Lozano MRN: [id number] Date of Birth: Aug 13, 1970

## 2019-11-29 ENCOUNTER — Other Ambulatory Visit: Payer: Self-pay

## 2019-11-29 ENCOUNTER — Encounter: Payer: Self-pay | Admitting: Internal Medicine

## 2019-11-29 ENCOUNTER — Ambulatory Visit (INDEPENDENT_AMBULATORY_CARE_PROVIDER_SITE_OTHER): Payer: BC Managed Care – PPO | Admitting: Internal Medicine

## 2019-11-29 VITALS — BP 138/80 | HR 86 | Temp 98.6°F | Ht 66.0 in | Wt 144.0 lb

## 2019-11-29 DIAGNOSIS — Z23 Encounter for immunization: Secondary | ICD-10-CM | POA: Diagnosis not present

## 2019-11-29 DIAGNOSIS — J4520 Mild intermittent asthma, uncomplicated: Secondary | ICD-10-CM

## 2019-11-29 DIAGNOSIS — Z0000 Encounter for general adult medical examination without abnormal findings: Secondary | ICD-10-CM

## 2019-11-29 DIAGNOSIS — I10 Essential (primary) hypertension: Secondary | ICD-10-CM

## 2019-11-29 DIAGNOSIS — Z1283 Encounter for screening for malignant neoplasm of skin: Secondary | ICD-10-CM

## 2019-11-29 DIAGNOSIS — Q210 Ventricular septal defect: Secondary | ICD-10-CM

## 2019-11-29 NOTE — Progress Notes (Signed)
Established Patient Office Visit     This visit occurred during the SARS-CoV-2 public health emergency.  Safety protocols were in place, including screening questions prior to the visit, additional usage of staff PPE, and extensive cleaning of exam room while observing appropriate contact time as indicated for disinfecting solutions.    CC/Reason for Visit: Annual preventive exam  HPI: Elizabeth Lozano is a 49 y.o. female who is coming in today for the above mentioned reasons. Past Medical History is significant for: Asthma/allergies, depression.  She is not currently medicated for depression, she has been attending cognitive behavioral therapy.  She has a history of chronic back pain and has been attending physical therapy sessions.  She had a colonoscopy earlier this year as well as her mammogram.  She sees GYN and had a Pap smear 2 years ago.  She does not have routine eye or dental care.  She completed her Covid vaccination series and her flu recently.  She is due for her first shingles and Pneumovax today.   Past Medical/Surgical History: Past Medical History:  Diagnosis Date  . Allergy    she has seen Dr. Tiajuana Amass   . Anemia    long term issues per pt  . Anxiety    due to back pain   . Asthma   . Depression   . Heart murmur   . History of stress test    ETT-echo (9/14):  ST depression noted on ECGs; no wall motion abnormalities at peak exercise on echocardiogram  . Hypertension   . VSD (ventricular septal defect)    Cardiac CTA 05/2008: Membranous VSD, 8-9 mm in diameter, mild RVE, EF 64%, calcium score 0, no CAD. Last echo 02/2011: EF 55-60%, mild CAD, perimembranous VSD slightly more prominent but no RVE or pulmonary hypertension;  Echo (9/14): Small perimembranous VSD-no significant change since 02/2011; EF 55-60%, moderate LAE    Past Surgical History:  Procedure Laterality Date  . cyst left wrist    . dental implants    . WISDOM TOOTH EXTRACTION      Social  History:  reports that she has never smoked. She has never used smokeless tobacco. She reports current alcohol use of about 4.0 standard drinks of alcohol per week. She reports that she does not use drugs.  Allergies: Allergies  Allergen Reactions  . Erythromycin Nausea And Vomiting  . Iohexol Hives     Code: HIVES, Desc: RN AMY NOTICED HIVE ON STOMACH AFTER HEART SCAN. ALSO ONE ON BACK. NOT ITCHING UNTIL AFTER SHE KNEW OF HIVES. DR.NISHAN NOTIFIED. GIVEN BENADRYL., Onset Date: [id number]   . Tetracycline Hives    Family History:  Family History  Problem Relation Age of Onset  . Sarcoidosis Mother   . Colon polyps Mother   . Heart attack Father   . Hypertension Father   . Hyperlipidemia Father   . Melanoma Father   . Cancer Father   . Healthy Brother   . Colon cancer Paternal Grandmother   . Crohn's disease Neg Hx   . Esophageal cancer Neg Hx   . Rectal cancer Neg Hx   . Stomach cancer Neg Hx      Current Outpatient Medications:  .  cetirizine (ZYRTEC) 10 MG tablet, Take 10 mg by mouth daily., Disp: , Rfl:  .  cyanocobalamin 1000 MCG tablet, Take 1,000 mcg by mouth daily., Disp: , Rfl:  .  MAGNESIUM PO, Take 200 mg by mouth daily. , Disp: , Rfl:  .  Multiple Vitamin (MULTIVITAMIN) tablet, Take 1 tablet by mouth daily. Centrum Mini's for Women 50 Plus, Disp: , Rfl:  .  norethindrone (MICRONOR) 0.35 MG tablet, Take 1 tablet by mouth daily., Disp: , Rfl:  .  acetaminophen (TYLENOL) 500 MG tablet, Take 500 mg by mouth every 6 (six) hours as needed. (Patient not taking: Reported on 11/29/2019), Disp: , Rfl:   Review of Systems:  Constitutional: Denies fever, chills, diaphoresis, appetite change and fatigue.  HEENT: Denies photophobia, eye pain, redness, hearing loss, ear pain, congestion, sore throat, rhinorrhea, sneezing, mouth sores, trouble swallowing, neck pain, neck stiffness and tinnitus.   Respiratory: Denies SOB, DOE, cough, chest tightness,  and wheezing.    Cardiovascular: Denies chest pain, palpitations and leg swelling.  Gastrointestinal: Denies nausea, vomiting, abdominal pain, diarrhea, constipation, blood in stool and abdominal distention.  Genitourinary: Denies dysuria, urgency, frequency, hematuria, flank pain and difficulty urinating.  Endocrine: Denies: hot or cold intolerance, sweats, changes in hair or nails, polyuria, polydipsia. Musculoskeletal: Denies  joint swelling, arthralgias and gait problem.  Skin: Denies pallor, rash and wound.  Neurological: Denies dizziness, seizures, syncope, weakness, light-headedness, numbness and headaches.  Hematological: Denies adenopathy. Easy bruising, personal or family bleeding history  Psychiatric/Behavioral: Denies suicidal ideation, mood changes, confusion, nervousness, sleep disturbance and agitation    Physical Exam: Vitals:   11/29/19 0833  BP: 138/80  Pulse: 86  Temp: 98.6 F (37 C)  TempSrc: Oral  SpO2: 99%  Weight: 144 lb (65.3 kg)  Height: '5\' 6"'  (1.676 m)    Body mass index is 23.24 kg/m.   Constitutional: NAD, calm, comfortable Eyes: PERRL, lids and conjunctivae normal ENMT: Mucous membranes are moist. Posterior pharynx clear of any exudate or lesions. Normal dentition. Tympanic membrane is pearly white, no erythema or bulging. Neck: normal, supple, no masses, no thyromegaly Respiratory: clear to auscultation bilaterally, no wheezing, no crackles. Normal respiratory effort. No accessory muscle use.  Cardiovascular: Regular rate and rhythm, strong holosystolic murmur, no rubs / gallops. No extremity edema. 2+ pedal pulses.  Abdomen: no tenderness, no masses palpated. No hepatosplenomegaly. Bowel sounds positive.  Musculoskeletal: no clubbing / cyanosis. No joint deformity upper and lower extremities. Good ROM, no contractures. Normal muscle tone.  Skin: no rashes, lesions, ulcers. No induration Neurologic: CN 2-12 grossly intact. Sensation intact, DTR normal. Strength  5/5 in all 4.  Psychiatric: Normal judgment and insight. Alert and oriented x 3. Normal mood.    Impression and Plan:  Encounter for preventive health examination  -Advised routine eye and dental care. -Pneumovax and for shingles vaccine today, otherwise immunizations are up-to-date. -Screening labs today. -Healthy lifestyle discussed in detail. -She had a colonoscopy in July 2021 and is a 10-year callback. -Her mammogram was normal in August 2021. -She has a GYN who does her Pap smears, last was in 2019.  Primary hypertension  -Blood pressures well controlled today and she is not on any antihypertensives, recommended continue ambulatory blood pressure monitoring.  Mild intermittent asthma without complication -Currently manages just with daily antihistamine.  VSD (ventricular septal defect) -Followed by cardiology. -Last echo was in 2018 and showed stability of VSD.  Need for shingles vaccine -For shingles vaccine administered today.  Need for vaccination against Streptococcus pneumoniae -Pneumovax administered today.    Patient Instructions  -Nice seeing you today!!  -Lab work today; will notify you once results are available.  -Pneumonia and first shingles vaccine today.  -Schedule follow up in 6 months.   Preventive Care 24-49 Years Old, Female  Preventive care refers to visits with your health care provider and lifestyle choices that can promote health and wellness. This includes:  A yearly physical exam. This may also be called an annual well check.  Regular dental visits and eye exams.  Immunizations.  Screening for certain conditions.  Healthy lifestyle choices, such as eating a healthy diet, getting regular exercise, not using drugs or products that contain nicotine and tobacco, and limiting alcohol use. What can I expect for my preventive care visit? Physical exam Your health care provider will check your:  Height and weight. This may be used to  calculate body mass index (BMI), which tells if you are at a healthy weight.  Heart rate and blood pressure.  Skin for abnormal spots. Counseling Your health care provider may ask you questions about your:  Alcohol, tobacco, and drug use.  Emotional well-being.  Home and relationship well-being.  Sexual activity.  Eating habits.  Work and work Statistician.  Method of birth control.  Menstrual cycle.  Pregnancy history. What immunizations do I need?  Influenza (flu) vaccine  This is recommended every year. Tetanus, diphtheria, and pertussis (Tdap) vaccine  You may need a Td booster every 10 years. Varicella (chickenpox) vaccine  You may need this if you have not been vaccinated. Zoster (shingles) vaccine  You may need this after age 26. Measles, mumps, and rubella (MMR) vaccine  You may need at least one dose of MMR if you were born in 1957 or later. You may also need a second dose. Pneumococcal conjugate (PCV13) vaccine  You may need this if you have certain conditions and were not previously vaccinated. Pneumococcal polysaccharide (PPSV23) vaccine  You may need one or two doses if you smoke cigarettes or if you have certain conditions. Meningococcal conjugate (MenACWY) vaccine  You may need this if you have certain conditions. Hepatitis A vaccine  You may need this if you have certain conditions or if you travel or work in places where you may be exposed to hepatitis A. Hepatitis B vaccine  You may need this if you have certain conditions or if you travel or work in places where you may be exposed to hepatitis B. Haemophilus influenzae type b (Hib) vaccine  You may need this if you have certain conditions. Human papillomavirus (HPV) vaccine  If recommended by your health care provider, you may need three doses over 6 months. You may receive vaccines as individual doses or as more than one vaccine together in one shot (combination vaccines). Talk with  your health care provider about the risks and benefits of combination vaccines. What tests do I need? Blood tests  Lipid and cholesterol levels. These may be checked every 5 years, or more frequently if you are over 39 years old.  Hepatitis C test.  Hepatitis B test. Screening  Lung cancer screening. You may have this screening every year starting at age 69 if you have a 30-pack-year history of smoking and currently smoke or have quit within the past 15 years.  Colorectal cancer screening. All adults should have this screening starting at age 87 and continuing until age 40. Your health care provider may recommend screening at age 51 if you are at increased risk. You will have tests every 1-10 years, depending on your results and the type of screening test.  Diabetes screening. This is done by checking your blood sugar (glucose) after you have not eaten for a while (fasting). You may have this done every 1-3 years.  Mammogram. This may be done every 1-2 years. Talk with your health care provider about when you should start having regular mammograms. This may depend on whether you have a family history of breast cancer.  BRCA-related cancer screening. This may be done if you have a family history of breast, ovarian, tubal, or peritoneal cancers.  Pelvic exam and Pap test. This may be done every 3 years starting at age 52. Starting at age 87, this may be done every 5 years if you have a Pap test in combination with an HPV test. Other tests  Sexually transmitted disease (STD) testing.  Bone density scan. This is done to screen for osteoporosis. You may have this scan if you are at high risk for osteoporosis. Follow these instructions at home: Eating and drinking  Eat a diet that includes fresh fruits and vegetables, whole grains, lean protein, and low-fat dairy.  Take vitamin and mineral supplements as recommended by your health care provider.  Do not drink alcohol if: ? Your health  care provider tells you not to drink. ? You are pregnant, may be pregnant, or are planning to become pregnant.  If you drink alcohol: ? Limit how much you have to 0-1 drink a day. ? Be aware of how much alcohol is in your drink. In the [place] one drink equals one 12 oz bottle of beer (355 mL), one 5 oz glass of wine (148 mL), or one 1 oz glass of hard liquor (44 mL). Lifestyle  Take daily care of your teeth and gums.  Stay active. Exercise for at least 30 minutes on 5 or more days each week.  Do not use any products that contain nicotine or tobacco, such as cigarettes, e-cigarettes, and chewing tobacco. If you need help quitting, ask your health care provider.  If you are sexually active, practice safe sex. Use a condom or other form of birth control (contraception) in order to prevent pregnancy and STIs (sexually transmitted infections).  If told by your health care provider, take low-dose aspirin daily starting at age 47. What's next?  Visit your health care provider once a year for a well check visit.  Ask your health care provider how often you should have your eyes and teeth checked.  Stay up to date on all vaccines. This information is not intended to replace advice given to you by your health care provider. Make sure you discuss any questions you have with your health care provider. Document Revised: 10/12/2017 Document Reviewed: 10/12/2017 Elsevier Patient Education  2020 [place], [place] [facility]

## 2019-11-29 NOTE — Patient Instructions (Signed)
-Nice seeing you today!!  -Lab work today; will notify you once results are available.  -Pneumonia and first shingles vaccine today.  -Schedule follow up in 6 months.   Preventive Care 62-50 Years Old, Female Preventive care refers to visits with your health care provider and lifestyle choices that can promote health and wellness. This includes:  A yearly physical exam. This may also be called an annual well check.  Regular dental visits and eye exams.  Immunizations.  Screening for certain conditions.  Healthy lifestyle choices, such as eating a healthy diet, getting regular exercise, not using drugs or products that contain nicotine and tobacco, and limiting alcohol use. What can I expect for my preventive care visit? Physical exam Your health care provider will check your:  Height and weight. This may be used to calculate body mass index (BMI), which tells if you are at a healthy weight.  Heart rate and blood pressure.  Skin for abnormal spots. Counseling Your health care provider may ask you questions about your:  Alcohol, tobacco, and drug use.  Emotional well-being.  Home and relationship well-being.  Sexual activity.  Eating habits.  Work and work Statistician.  Method of birth control.  Menstrual cycle.  Pregnancy history. What immunizations do I need?  Influenza (flu) vaccine  This is recommended every year. Tetanus, diphtheria, and pertussis (Tdap) vaccine  You may need a Td booster every 10 years. Varicella (chickenpox) vaccine  You may need this if you have not been vaccinated. Zoster (shingles) vaccine  You may need this after age 52. Measles, mumps, and rubella (MMR) vaccine  You may need at least one dose of MMR if you were born in 1957 or later. You may also need a second dose. Pneumococcal conjugate (PCV13) vaccine  You may need this if you have certain conditions and were not previously vaccinated. Pneumococcal polysaccharide  (PPSV23) vaccine  You may need one or two doses if you smoke cigarettes or if you have certain conditions. Meningococcal conjugate (MenACWY) vaccine  You may need this if you have certain conditions. Hepatitis A vaccine  You may need this if you have certain conditions or if you travel or work in places where you may be exposed to hepatitis A. Hepatitis B vaccine  You may need this if you have certain conditions or if you travel or work in places where you may be exposed to hepatitis B. Haemophilus influenzae type b (Hib) vaccine  You may need this if you have certain conditions. Human papillomavirus (HPV) vaccine  If recommended by your health care provider, you may need three doses over 6 months. You may receive vaccines as individual doses or as more than one vaccine together in one shot (combination vaccines). Talk with your health care provider about the risks and benefits of combination vaccines. What tests do I need? Blood tests  Lipid and cholesterol levels. These may be checked every 5 years, or more frequently if you are over 26 years old.  Hepatitis C test.  Hepatitis B test. Screening  Lung cancer screening. You may have this screening every year starting at age 28 if you have a 30-pack-year history of smoking and currently smoke or have quit within the past 15 years.  Colorectal cancer screening. All adults should have this screening starting at age 87 and continuing until age 67. Your health care provider may recommend screening at age 48 if you are at increased risk. You will have tests every 1-10 years, depending on your results  and the type of screening test.  Diabetes screening. This is done by checking your blood sugar (glucose) after you have not eaten for a while (fasting). You may have this done every 1-3 years.  Mammogram. This may be done every 1-2 years. Talk with your health care provider about when you should start having regular mammograms. This may  depend on whether you have a family history of breast cancer.  BRCA-related cancer screening. This may be done if you have a family history of breast, ovarian, tubal, or peritoneal cancers.  Pelvic exam and Pap test. This may be done every 3 years starting at age 25. Starting at age 76, this may be done every 5 years if you have a Pap test in combination with an HPV test. Other tests  Sexually transmitted disease (STD) testing.  Bone density scan. This is done to screen for osteoporosis. You may have this scan if you are at high risk for osteoporosis. Follow these instructions at home: Eating and drinking  Eat a diet that includes fresh fruits and vegetables, whole grains, lean protein, and low-fat dairy.  Take vitamin and mineral supplements as recommended by your health care provider.  Do not drink alcohol if: ? Your health care provider tells you not to drink. ? You are pregnant, may be pregnant, or are planning to become pregnant.  If you drink alcohol: ? Limit how much you have to 0-1 drink a day. ? Be aware of how much alcohol is in your drink. In the [place] one drink equals one 12 oz bottle of beer (355 mL), one 5 oz glass of wine (148 mL), or one 1 oz glass of hard liquor (44 mL). Lifestyle  Take daily care of your teeth and gums.  Stay active. Exercise for at least 30 minutes on 5 or more days each week.  Do not use any products that contain nicotine or tobacco, such as cigarettes, e-cigarettes, and chewing tobacco. If you need help quitting, ask your health care provider.  If you are sexually active, practice safe sex. Use a condom or other form of birth control (contraception) in order to prevent pregnancy and STIs (sexually transmitted infections).  If told by your health care provider, take low-dose aspirin daily starting at age 28. What's next?  Visit your health care provider once a year for a well check visit.  Ask your health care provider how often you should  have your eyes and teeth checked.  Stay up to date on all vaccines. This information is not intended to replace advice given to you by your health care provider. Make sure you discuss any questions you have with your health care provider. Document Revised: 10/12/2017 Document Reviewed: 10/12/2017 Elsevier Patient Education  2020 [employer].

## 2019-11-30 LAB — COMPREHENSIVE METABOLIC PANEL
AG Ratio: 1.7 (calc) (ref 1.0–2.5)
ALT: 22 U/L (ref 6–29)
AST: 27 U/L (ref 10–35)
Albumin: 4.5 g/dL (ref 3.6–5.1)
Alkaline phosphatase (APISO): 61 U/L (ref 37–153)
BUN: 9 mg/dL (ref 7–25)
CO2: 28 mmol/L (ref 20–32)
Calcium: 9.7 mg/dL (ref 8.6–10.4)
Chloride: 104 mmol/L (ref 98–110)
Creat: 0.84 mg/dL (ref 0.50–1.05)
Globulin: 2.7 g/dL (calc) (ref 1.9–3.7)
Glucose, Bld: 93 mg/dL (ref 65–99)
Potassium: 4.4 mmol/L (ref 3.5–5.3)
Sodium: 139 mmol/L (ref 135–146)
Total Bilirubin: 0.7 mg/dL (ref 0.2–1.2)
Total Protein: 7.2 g/dL (ref 6.1–8.1)

## 2019-11-30 LAB — CBC WITH DIFFERENTIAL/PLATELET
Absolute Monocytes: 301 cells/uL (ref 200–950)
Basophils Absolute: 82 cells/uL (ref 0–200)
Basophils Relative: 1.6 %
Eosinophils Absolute: 158 cells/uL (ref 15–500)
Eosinophils Relative: 3.1 %
HCT: 40 % (ref 35.0–45.0)
Hemoglobin: 13 g/dL (ref 11.7–15.5)
Lymphs Abs: 1913 cells/uL (ref 850–3900)
MCH: 30 pg (ref 27.0–33.0)
MCHC: 32.5 g/dL (ref 32.0–36.0)
MCV: 92.2 fL (ref 80.0–100.0)
MPV: 9.8 fL (ref 7.5–12.5)
Monocytes Relative: 5.9 %
Neutro Abs: 2647 cells/uL (ref 1500–7800)
Neutrophils Relative %: 51.9 %
Platelets: 238 10*3/uL (ref 140–400)
RBC: 4.34 10*6/uL (ref 3.80–5.10)
RDW: 12.7 % (ref 11.0–15.0)
Total Lymphocyte: 37.5 %
WBC: 5.1 10*3/uL (ref 3.8–10.8)

## 2019-11-30 LAB — LIPID PANEL
Cholesterol: 200 mg/dL — ABNORMAL HIGH (ref <200)
HDL: 67 mg/dL (ref >=50)
LDL Cholesterol (Calc): 118 mg/dL (calc) — ABNORMAL HIGH
Non-HDL Cholesterol (Calc): 133 mg/dL (calc) — ABNORMAL HIGH (ref <130)
Total CHOL/HDL Ratio: 3 (calc) (ref <5.0)
Triglycerides: 56 mg/dL (ref <150)

## 2019-11-30 LAB — HEMOGLOBIN A1C
Hgb A1c MFr Bld: 5.3 % of total Hgb (ref <5.7)
Mean Plasma Glucose: 105 (calc)
eAG (mmol/L): 5.8 (calc)

## 2019-11-30 LAB — VITAMIN D 25 HYDROXY (VIT D DEFICIENCY, FRACTURES): Vit D, 25-Hydroxy: 26 ng/mL — ABNORMAL LOW (ref 30–100)

## 2019-11-30 LAB — TSH: TSH: 1.79 mIU/L

## 2019-11-30 LAB — VITAMIN B12: Vitamin B-12: 469 pg/mL (ref 200–1100)

## 2019-12-02 ENCOUNTER — Encounter: Payer: Self-pay | Admitting: Physical Therapy

## 2019-12-02 ENCOUNTER — Other Ambulatory Visit: Payer: Self-pay

## 2019-12-02 ENCOUNTER — Ambulatory Visit: Payer: BC Managed Care – PPO | Admitting: Physical Therapy

## 2019-12-02 DIAGNOSIS — M545 Low back pain, unspecified: Secondary | ICD-10-CM | POA: Diagnosis not present

## 2019-12-02 DIAGNOSIS — M6281 Muscle weakness (generalized): Secondary | ICD-10-CM | POA: Diagnosis not present

## 2019-12-02 DIAGNOSIS — R252 Cramp and spasm: Secondary | ICD-10-CM

## 2019-12-02 NOTE — Therapy (Signed)
[facility] [address], [address] [place], [place], [address] Phone: [phone]   Fax:  [phone]  Physical Therapy Treatment  Patient Details  Name: Elizabeth Lozano MRN: [id number] Date of Birth: 01-Dec-1970 Referring Provider (PT): Philip Aspen, Limmie Patricia, MD   Encounter Date: 12/02/2019   PT End of Session - 12/02/19 1012    Visit Number 2    Number of Visits 11    Date for PT Re-Evaluation 01/22/20    Authorization Type BCBS    Authorization Time Period 11 visits left out of 30 for the year - hard stop    Authorization - Visit Number 2    Authorization - Number of Visits 11    PT Start Time 1014    PT Stop Time 1055    PT Time Calculation (min) 41 min    Activity Tolerance Patient tolerated treatment well    Behavior During Therapy WFL for tasks assessed/performed           Past Medical History:  Diagnosis Date   Allergy    she has seen Dr. Eileen Stanford    Anemia    long term issues per pt   Anxiety    due to back pain    Asthma    Depression    Heart murmur    History of stress test    ETT-echo (9/14):  ST depression noted on ECGs; no wall motion abnormalities at peak exercise on echocardiogram   Hypertension    VSD (ventricular septal defect)    Cardiac CTA 05/2008: Membranous VSD, 8-9 mm in diameter, mild RVE, EF 64%, calcium score 0, no CAD. Last echo 02/2011: EF 55-60%, mild CAD, perimembranous VSD slightly more prominent but no RVE or pulmonary hypertension;  Echo (9/14): Small perimembranous VSD-no significant change since 02/2011; EF 55-60%, moderate LAE    Past Surgical History:  Procedure Laterality Date   cyst left wrist     dental implants     WISDOM TOOTH EXTRACTION      There were no vitals filed for this visit.   Subjective Assessment - 12/02/19 1013    Subjective The stretches from the eval really helped me especially on my Rt side.  The Lt hip is killing me in the front and  side - I can't get it to open up with the stretches.  It bothered me walking the dog this morning.  I am using lacrosse ball to release hips which helps.    Pertinent History scoliosis, easily flares up    Limitations Sitting;Walking    How long can you sit comfortably? maybe 15 min    How long can you walk comfortably? walks dog x 15-20 min with pain    Patient Stated Goals get back to being able to sleep through night, sit x 45 min, drive for errands without pain, do HEP with tolerance    Pain Score 6     Pain Location Hip    Pain Orientation Left;Anterior;Lateral;Posterior    Pain Descriptors / Indicators Tightness;Cramping    Pain Type Acute pain;Chronic pain    Pain Onset More than a month ago    Pain Frequency Constant    Aggravating Factors  sitting, walking    Effect of Pain on Daily Activities pain with all activity                             [facility] Adult PT  Treatment/Exercise - 12/02/19 0001      Knee/Hip Exercises: Stretches   Quad Stretch Both;1 rep;30 seconds      Manual Therapy   Manual Therapy Soft tissue mobilization    Soft tissue mobilization Lt lateral hip s/p DN, Lt proximal quad stripping and broadening            Trigger Point Dry Needling - 12/02/19 0001    Consent Given? Yes    Education Handout Provided Previously provided    Muscles Treated Lower Quadrant Vastus lateralis    Muscles Treated Back/Hip Gluteus medius;Tensor fascia lata    Dry Needling Comments LEFT    Vastus lateralis Response Twitch response elicited;Palpable increased muscle length    Gluteus Medius Response Twitch response elicited;Palpable increased muscle length    Tensor Fascia Lata Response Twitch response elicited;Palpable increased muscle length                  PT Short Term Goals - 11/27/19 1327      PT SHORT TERM GOAL #1   Title be independent with initial HEP    Baseline -    Time 3    Period Weeks    Status New    Target Date 12/18/19       PT SHORT TERM GOAL #2   Title report a 30% reduction in Lt hip and back pain with sitting and standing    Baseline -    Time 4    Period Weeks    Status New    Target Date 12/25/19      PT SHORT TERM GOAL #3   Title Pt will achieve Lt hip flexibility to [facility] to reduce strain on Lt hemipelvis    Time 4    Period Weeks    Status New    Target Date 12/25/19      PT SHORT TERM GOAL #4   Title Pt will demo symmetrical trunk SB to 15 deg bil    Time 4    Period Weeks    Status New    Target Date 12/25/19             PT Long Term Goals - 11/27/19 1327      PT LONG TERM GOAL #1   Title be independent in advanced HEP    Time 8    Period Weeks    Status New    Target Date 01/22/20      PT LONG TERM GOAL #2   Title reduce FOTO to < or = to 31% limitation    Baseline 49%    Time 8    Period Weeks    Status New    Target Date 01/22/20      PT LONG TERM GOAL #3   Title Pt will be able to sit for driving and watching TV up to 45 min with min pain    Baseline -    Time 8    Period Weeks    Status New    Target Date 01/22/20      PT LONG TERM GOAL #4   Title Improved sleep by 70%    Baseline -    Time 8    Period Weeks    Status New    Target Date 01/22/20      PT LONG TERM GOAL #5   Title Pt will be able to walk her dog with min pain    Baseline -    Time 8  Period Weeks    Status New    Target Date 01/22/20                 Plan - 12/02/19 1057    Clinical Impression Statement Pt arrived with 6/10 pain in Lt hip having tried her HEP with it only giving relief on the the Rt side.  PT performed DN to Lt lateral and anterior hip with signif twitch and release. Pt reported reduction in pain end of session to 2/10.  Pt was able to lay supine and feel symmetrical and flat.  Improved soft tissue extensibility and reduced anterior pull on ASIS end of session.  PT reviewed Lt quad stretch in standing end of session for HEP.  Continue along POC.    PT  Frequency 2x / week    PT Duration 8 weeks    PT Treatment/Interventions Joint Manipulations;Spinal Manipulations;Taping;Dry needling;Passive range of motion;Manual techniques;Patient/family education;Neuromuscular re-education;Therapeutic exercise;Therapeutic activities;Functional mobility training;Electrical Stimulation;Cryotherapy;Moist Heat    PT Next Visit Plan f/u on Lt quad, TFL and glut med DN, continue soft tissue techniques, stretching and self-release strategies for Lt hip, find neutral hip in standing    PT Home Exercise Plan Access Code: [facility]    Consulted and Agree with Plan of Care Patient           Patient will benefit from skilled therapeutic intervention in order to improve the following deficits and impairments:     Visit Diagnosis: Acute bilateral low back pain without sciatica  Muscle weakness (generalized)  Cramp and spasm     Problem List Patient Active Problem List   Diagnosis Date Noted   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, [address] [place], [place], [address] Phone: [phone]   Fax:  [phone]  Name: Tameyah Koch MRN: [id number] Date of Birth: Sep 08, 1970

## 2019-12-03 ENCOUNTER — Other Ambulatory Visit: Payer: Self-pay | Admitting: Internal Medicine

## 2019-12-03 ENCOUNTER — Encounter: Payer: Self-pay | Admitting: Internal Medicine

## 2019-12-03 DIAGNOSIS — E559 Vitamin D deficiency, unspecified: Secondary | ICD-10-CM | POA: Insufficient documentation

## 2019-12-03 DIAGNOSIS — E785 Hyperlipidemia, unspecified: Secondary | ICD-10-CM | POA: Insufficient documentation

## 2019-12-03 MED ORDER — VITAMIN D (ERGOCALCIFEROL) 1.25 MG (50000 UNIT) PO CAPS: 50000.0000 [IU] | ORAL_CAPSULE | ORAL | 0 refills | Status: DC

## 2019-12-04 ENCOUNTER — Ambulatory Visit: Payer: BC Managed Care – PPO | Admitting: Physical Therapy

## 2019-12-04 ENCOUNTER — Other Ambulatory Visit: Payer: Self-pay | Admitting: Internal Medicine

## 2019-12-04 DIAGNOSIS — E1169 Type 2 diabetes mellitus with other specified complication: Secondary | ICD-10-CM

## 2019-12-04 DIAGNOSIS — E559 Vitamin D deficiency, unspecified: Secondary | ICD-10-CM

## 2019-12-04 DIAGNOSIS — E785 Hyperlipidemia, unspecified: Secondary | ICD-10-CM

## 2019-12-06 ENCOUNTER — Encounter: Payer: Self-pay | Admitting: Internal Medicine

## 2019-12-11 ENCOUNTER — Other Ambulatory Visit: Payer: Self-pay

## 2019-12-11 ENCOUNTER — Ambulatory Visit: Payer: BC Managed Care – PPO

## 2019-12-11 DIAGNOSIS — M545 Low back pain, unspecified: Secondary | ICD-10-CM

## 2019-12-11 DIAGNOSIS — R252 Cramp and spasm: Secondary | ICD-10-CM

## 2019-12-11 DIAGNOSIS — M6281 Muscle weakness (generalized): Secondary | ICD-10-CM

## 2019-12-11 NOTE — Therapy (Signed)
[facility] [address] [place], [place], [address] Phone: [phone]   Fax:  [phone]  Physical Therapy Treatment  Patient Details  Name: Elizabeth Lozano MRN: [id number] Date of Birth: 1970-11-23 Referring Provider (PT): Philip Aspen, Limmie Patricia, MD   Encounter Date: 12/11/2019   PT End of Session - 12/11/19 1444    Visit Number 3    Date for PT Re-Evaluation 01/22/20    Authorization Type BCBS    Authorization Time Period 11 visits left out of 30 for the year - hard stop    Authorization - Visit Number 3    Authorization - Number of Visits 11    PT Start Time 1402    PT Stop Time 1445    PT Time Calculation (min) 43 min    Activity Tolerance Patient tolerated treatment well    Behavior During Therapy [facility] for tasks assessed/performed           Past Medical History:  Diagnosis Date  . Allergy    she has seen Dr. Eileen Stanford   . Anemia    long term issues per pt  . Anxiety    due to back pain   . Asthma   . Depression   . Heart murmur   . History of stress test    ETT-echo (9/14):  ST depression noted on ECGs; no wall motion abnormalities at peak exercise on echocardiogram  . Hypertension   . VSD (ventricular septal defect)    Cardiac CTA 05/2008: Membranous VSD, 8-9 mm in diameter, mild RVE, EF 64%, calcium score 0, no CAD. Last echo 02/2011: EF 55-60%, mild CAD, perimembranous VSD slightly more prominent but no RVE or pulmonary hypertension;  Echo (9/14): Small perimembranous VSD-no significant change since 02/2011; EF 55-60%, moderate LAE    Past Surgical History:  Procedure Laterality Date  . cyst left wrist    . dental implants    . WISDOM TOOTH EXTRACTION      There were no vitals filed for this visit.   Subjective Assessment - 12/11/19 1359    Subjective It has been a mixed bag since I was here last.  My quads have been very tired.  I have been using my body in a more balanced way and now  having pain bilaterally.  I didn't sleep much because it hurts to sleep on my sides.    Patient Stated Goals get back to being able to sleep through night, sit x 45 min, drive for errands without pain, do HEP with tolerance    Currently in Pain? Yes    Pain Score 0-No pain   up to 8/10 in bil glutes   Pain Location Buttocks    Pain Orientation Right;Left    Pain Descriptors / Indicators Aching;Burning    Pain Type Acute pain    Pain Onset More than a month ago    Pain Frequency Constant    Aggravating Factors  sleeping on side, unknown trigger    Pain Relieving Factors stretching                             [facility] Adult PT Treatment/Exercise - 12/11/19 0001      Knee/Hip Exercises: Stretches   Knee: Self-Stretch to increase Flexion Left;3 reps;20 seconds    Other Knee/Hip Stretches open book stretch, prone press up x10 each      Manual Therapy   Manual Therapy  Soft tissue mobilization    Manual therapy comments Lt gluteals and bil lumbar spine             Trigger Point Dry Needling - 12/11/19 0001    Consent Given? Yes    Education Handout Provided Previously provided    Muscles Treated Back/Hip Gluteus minimus;Gluteus medius;Gluteus maximus;Lumbar multifidi    Dry Needling Comments LEFT    Gluteus Minimus Response Twitch response elicited;Palpable increased muscle length    Gluteus Medius Response Twitch response elicited;Palpable increased muscle length    Gluteus Maximus Response Twitch response elicited;Palpable increased muscle length    Lumbar multifidi Response Twitch response elicited;Palpable increased muscle length   bilteral               PT Education - 12/11/19 1414    Education Details Access Code: [facility]    Person(s) Educated Patient    Methods Explanation;Demonstration    Comprehension Verbalized understanding;Returned demonstration            PT Short Term Goals - 11/27/19 1327      PT SHORT TERM GOAL #1   Title be  independent with initial HEP    Baseline -    Time 3    Period Weeks    Status New    Target Date 12/18/19      PT SHORT TERM GOAL #2   Title report a 30% reduction in Lt hip and back pain with sitting and standing    Baseline -    Time 4    Period Weeks    Status New    Target Date 12/25/19      PT SHORT TERM GOAL #3   Title Pt will achieve Lt hip flexibility to [id number] to reduce strain on Lt hemipelvis    Time 4    Period Weeks    Status New    Target Date 12/25/19      PT SHORT TERM GOAL #4   Title Pt will demo symmetrical trunk SB to 15 deg bil    Time 4    Period Weeks    Status New    Target Date 12/25/19             PT Long Term Goals - 11/27/19 1327      PT LONG TERM GOAL #1   Title be independent in advanced HEP    Time 8    Period Weeks    Status New    Target Date 01/22/20      PT LONG TERM GOAL #2   Title reduce FOTO to < or = to 31% limitation    Baseline 49%    Time 8    Period Weeks    Status New    Target Date 01/22/20      PT LONG TERM GOAL #3   Title Pt will be able to sit for driving and watching TV up to 45 min with min pain    Baseline -    Time 8    Period Weeks    Status New    Target Date 01/22/20      PT LONG TERM GOAL #4   Title Improved sleep by 70%    Baseline -    Time 8    Period Weeks    Status New    Target Date 01/22/20      PT LONG TERM GOAL #5   Title Pt will be able to walk her dog with min pain  Baseline -    Time 8    Period Weeks    Status New    Target Date 01/22/20                 Plan - 12/11/19 1448    Clinical Impression Statement Pt has had a variety of symptoms since the last session including tension and pain in gluteals, abs and quads.  PT added 2 new flexibility exercises to help with onset of rectus stiffness and lumbar stiffness.  Pt with significant tension in the Lt gluteals and demonstrates reduced Lt hip flexibility into hip flexion and adduction.  Pt demonstrated improved tissue  mobility and A/ROM of the gluteals and lumbar A/ROM after manual therapy and dry needling.  Pt will continue to benefit from skilled PT to address lumbopelvic strength, flexibility and stability.    PT Frequency 2x / week    PT Duration 8 weeks    PT Treatment/Interventions Joint Manipulations;Spinal Manipulations;Taping;Dry needling;Passive range of motion;Manual techniques;Patient/family education;Neuromuscular re-education;Therapeutic exercise;Therapeutic activities;Functional mobility training;Electrical Stimulation;Cryotherapy;Moist Heat    PT Next Visit Plan review new HEP, strength, flexibility, continue dry needling    PT Home Exercise Plan Access Code: [facility]    Consulted and Agree with Plan of Care Patient           Patient will benefit from skilled therapeutic intervention in order to improve the following deficits and impairments:  Abnormal gait, Decreased range of motion, Postural dysfunction, Decreased strength, Decreased mobility, Impaired flexibility, Increased muscle spasms, Pain  Visit Diagnosis: Acute bilateral low back pain without sciatica  Muscle weakness (generalized)  Cramp and spasm     Problem List Patient Active Problem List   Diagnosis Date Noted  . Vitamin D deficiency 12/03/2019  . Hyperlipidemia 12/03/2019  . 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 Hypertension 05/03/2016  . Low back pain 06/16/2014  . Anemia due to other cause 03/24/2014  . Routine general medical examination at a health care facility 03/24/2014  . VSD (ventricular septal defect) 03/09/2011  . URTICARIA 06/03/2008  . SEBACEOUS CYST 10/09/2007  . Asthma 10/31/2006     Lorrene Reid, PT 12/11/19 2:51 PM  [facility] [address] [place], [place], [address] Phone: [phone]   Fax:  [phone]  Name: Lamoyne Palencia MRN: [id number] Date of Birth: 13-Mar-1970

## 2019-12-11 NOTE — Patient Instructions (Signed)
Access Code: [facility] URL: [email or web address] Date: 12/11/2019 Prepared by: Tresa Endo  Exercises  Sidelying Open Book Thoracic Rotation with Knee on Foam Roll - 2 x daily - 7 x weekly - 1 sets - 10 reps Prone Press Up - 2 x daily - 7 x weekly - 1 sets - 10 reps

## 2019-12-18 ENCOUNTER — Other Ambulatory Visit: Payer: Self-pay

## 2019-12-18 ENCOUNTER — Ambulatory Visit: Payer: BC Managed Care – PPO | Attending: Internal Medicine

## 2019-12-18 DIAGNOSIS — M545 Low back pain, unspecified: Secondary | ICD-10-CM | POA: Diagnosis not present

## 2019-12-18 DIAGNOSIS — R252 Cramp and spasm: Secondary | ICD-10-CM

## 2019-12-18 DIAGNOSIS — M6281 Muscle weakness (generalized): Secondary | ICD-10-CM

## 2019-12-18 NOTE — Therapy (Signed)
[facility] [address], [address] [place], [place], [address] Phone: [phone]   Fax:  [phone]  Physical Therapy Treatment  Patient Details  Name: Elizabeth Lozano MRN: [id number] Date of Birth: 1970-03-16 Referring Provider (PT): Philip Aspen, Limmie Patricia, MD   Encounter Date: 12/18/2019   PT End of Session - 12/18/19 1445    Visit Number 4    Date for PT Re-Evaluation 01/22/20    Authorization Type BCBS    Authorization Time Period 11 visits left out of 30 for the year - hard stop    Authorization - Visit Number 4    Authorization - Number of Visits 11    PT Start Time 1400    PT Stop Time 1441    PT Time Calculation (min) 41 min    Activity Tolerance Patient tolerated treatment well    Behavior During Therapy [facility] for tasks assessed/performed           Past Medical History:  Diagnosis Date  . Allergy    she has seen Dr. Eileen Stanford   . Anemia    long term issues per pt  . Anxiety    due to back pain   . Asthma   . Depression   . Heart murmur   . History of stress test    ETT-echo (9/14):  ST depression noted on ECGs; no wall motion abnormalities at peak exercise on echocardiogram  . Hypertension   . VSD (ventricular septal defect)    Cardiac CTA 05/2008: Membranous VSD, 8-9 mm in diameter, mild RVE, EF 64%, calcium score 0, no CAD. Last echo 02/2011: EF 55-60%, mild CAD, perimembranous VSD slightly more prominent but no RVE or pulmonary hypertension;  Echo (9/14): Small perimembranous VSD-no significant change since 02/2011; EF 55-60%, moderate LAE    Past Surgical History:  Procedure Laterality Date  . cyst left wrist    . dental implants    . WISDOM TOOTH EXTRACTION      There were no vitals filed for this visit.   Subjective Assessment - 12/18/19 1359    Subjective I feel 30% better overall. I bought new shoes with arch supports and this has helped.  No change after dry needling last session.    Patient  Stated Goals get back to being able to sleep through night, sit x 45 min, drive for errands without pain, do HEP with tolerance    Currently in Pain? Yes    Pain Score 4     Pain Location Groin    Pain Orientation Right;Left    Pain Descriptors / Indicators Aching;Tightness    Pain Type Acute pain    Pain Onset More than a month ago    Pain Frequency Constant    Aggravating Factors  sitting/driving, sleep at night.    Pain Relieving Factors stretching                             [facility] Adult PT Treatment/Exercise - 12/18/19 0001      Manual Therapy   Manual Therapy Soft tissue mobilization    Manual therapy comments Lt hip flexors, proximal quads, and quadratus            Trigger Point Dry Needling - 12/18/19 0001    Consent Given? Yes    Education Handout Provided Previously provided    Muscles Treated Lower Quadrant Vastus medialis;Rectus femoris    Muscles Treated Back/Hip  Quadratus lumborum;Iliopsoas    Dry Needling Comments Left     Rectus femoris Response Twitch response elicited;Palpable increased muscle length    Vastus lateralis Response Twitch response elicited;Palpable increased muscle length    Vastus medialis Response Twitch response elicited;Palpable increased muscle length    Iliopsoas Response Twitch response elicited;Palpable increased muscle length    Quadratus Lumborum Response Twitch response elicited;Palpable increased muscle length                  PT Short Term Goals - 12/18/19 1406      PT SHORT TERM GOAL #1   Title be independent with initial HEP    Status Achieved      PT SHORT TERM GOAL #2   Title report a 30% reduction in Lt hip and back pain with sitting and standing    Status Achieved      PT SHORT TERM GOAL #3   Title Pt will achieve Lt hip flexibility to [facility] to reduce strain on Lt hemipelvis    Time 4    Period Weeks    Status On-going             PT Long Term Goals - 11/27/19 1327      PT LONG TERM  GOAL #1   Title be independent in advanced HEP    Time 8    Period Weeks    Status New    Target Date 01/22/20      PT LONG TERM GOAL #2   Title reduce FOTO to < or = to 31% limitation    Baseline 49%    Time 8    Period Weeks    Status New    Target Date 01/22/20      PT LONG TERM GOAL #3   Title Pt will be able to sit for driving and watching TV up to 45 min with min pain    Baseline -    Time 8    Period Weeks    Status New    Target Date 01/22/20      PT LONG TERM GOAL #4   Title Improved sleep by 70%    Baseline -    Time 8    Period Weeks    Status New    Target Date 01/22/20      PT LONG TERM GOAL #5   Title Pt will be able to walk her dog with min pain    Baseline -    Time 8    Period Weeks    Status New    Target Date 01/22/20                 Plan - 12/18/19 1448    Clinical Impression Statement Pt no longer has complaints of gluteal pain and now has main complaint of Lt>Rt hip flexor tightness. Pt reports 50% improvement in overall pain with standing and walking.  Sitting remains challenging and pt reports 30% overall improvement with this.    Pt with tension in the hip flexors, quadratus and psoas today.  Pt demonstrated improved tissue mobility and A/ROM of the gluteals and lumbar A/ROM after manual therapy and dry needling.  Pt will continue to benefit from skilled PT to address lumbopelvic strength, flexibility and stability.    Rehab Potential Excellent    PT Frequency 2x / week    PT Duration 8 weeks    PT Treatment/Interventions Joint Manipulations;Spinal Manipulations;Taping;Dry needling;Passive range of motion;Manual techniques;Patient/family education;Neuromuscular re-education;Therapeutic  exercise;Therapeutic activities;Functional mobility training;Electrical Stimulation;Cryotherapy;Moist Heat    PT Next Visit Plan review new HEP, strength, flexibility, continue dry needling    PT Home Exercise Plan Access Code: [facility]    Consulted and  Agree with Plan of Care Patient           Patient will benefit from skilled therapeutic intervention in order to improve the following deficits and impairments:  Abnormal gait, Decreased range of motion, Postural dysfunction, Decreased strength, Decreased mobility, Impaired flexibility, Increased muscle spasms, Pain  Visit Diagnosis: Muscle weakness (generalized)  Acute bilateral low back pain without sciatica  Cramp and spasm     Problem List Patient Active Problem List   Diagnosis Date Noted  . Vitamin D deficiency 12/03/2019  . Hyperlipidemia 12/03/2019  . 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 Hypertension 05/03/2016  . Low back pain 06/16/2014  . Anemia due to other cause 03/24/2014  . Routine general medical examination at a health care facility 03/24/2014  . VSD (ventricular septal defect) 03/09/2011  . URTICARIA 06/03/2008  . SEBACEOUS CYST 10/09/2007  . Asthma 10/31/2006    Lorrene Reid, PT 12/18/19 2:48 PM  [facility] [address] [place], [place], [address] Phone: [phone]   Fax:  [phone]  Name: Elizabeth Lozano MRN: [id number] Date of Birth: 07/01/1970

## 2019-12-25 ENCOUNTER — Other Ambulatory Visit: Payer: Self-pay

## 2019-12-25 ENCOUNTER — Ambulatory Visit: Payer: BC Managed Care – PPO

## 2019-12-25 DIAGNOSIS — R252 Cramp and spasm: Secondary | ICD-10-CM | POA: Diagnosis not present

## 2019-12-25 DIAGNOSIS — M6281 Muscle weakness (generalized): Secondary | ICD-10-CM

## 2019-12-25 DIAGNOSIS — M545 Low back pain, unspecified: Secondary | ICD-10-CM

## 2019-12-25 NOTE — Therapy (Signed)
[facility] [address], [address] [place], [place], [address] Phone: [phone]   Fax:  [phone]  Physical Therapy Treatment  Patient Details  Name: Elizabeth Lozano MRN: [id number] Date of Birth: 1970-03-28 Referring Provider (PT): Philip Aspen, Limmie Patricia, MD   Encounter Date: 12/25/2019   PT End of Session - 12/25/19 1450    Visit Number 5    Date for PT Re-Evaluation 01/22/20    Authorization Type BCBS    Authorization - Visit Number 5    Authorization - Number of Visits 11    PT Start Time 1401    PT Stop Time 1445    PT Time Calculation (min) 44 min    Activity Tolerance Patient tolerated treatment well    Behavior During Therapy [facility] for tasks assessed/performed           Past Medical History:  Diagnosis Date  . Allergy    she has seen Dr. Eileen Stanford   . Anemia    long term issues per pt  . Anxiety    due to back pain   . Asthma   . Depression   . Heart murmur   . History of stress test    ETT-echo (9/14):  ST depression noted on ECGs; no wall motion abnormalities at peak exercise on echocardiogram  . Hypertension   . VSD (ventricular septal defect)    Cardiac CTA 05/2008: Membranous VSD, 8-9 mm in diameter, mild RVE, EF 64%, calcium score 0, no CAD. Last echo 02/2011: EF 55-60%, mild CAD, perimembranous VSD slightly more prominent but no RVE or pulmonary hypertension;  Echo (9/14): Small perimembranous VSD-no significant change since 02/2011; EF 55-60%, moderate LAE    Past Surgical History:  Procedure Laterality Date  . cyst left wrist    . dental implants    . WISDOM TOOTH EXTRACTION      There were no vitals filed for this visit.   Subjective Assessment - 12/25/19 1402    Subjective It has been not a good couple of days.  The pain is now settled in my glutes.  I am trying to not sleep in the fetal position to not aggravate my hip flexors and now I feel like I am not getting a good gluteal stretch.      Patient Stated Goals get back to being able to sleep through night, sit x 45 min, drive for errands without pain, do HEP with tolerance    Currently in Pain? Yes    Pain Score 4     Pain Location Buttocks    Pain Orientation Right;Left    Pain Descriptors / Indicators Aching;Tightness    Pain Type Acute pain    Pain Onset More than a month ago    Pain Frequency Constant    Aggravating Factors  sitting, sleep at night    Pain Relieving Factors stretching                             [facility] Adult PT Treatment/Exercise - 12/25/19 0001      Lumbar Exercises: Stretches   Piriformis Stretch Right;Left;3 reps;20 seconds    Piriformis Stretch Limitations discussed and demoed multiple positions of this stretch to determine best fit      Manual Therapy   Manual Therapy Soft tissue mobilization    Manual therapy comments bil lumbar paraspinals and gluteals  Trigger Point Dry Needling - 12/25/19 0001    Consent Given? Yes    Muscles Treated Back/Hip Lumbar multifidi;Gluteus medius;Gluteus minimus;Piriformis   Rt and LT   Gluteus Minimus Response Twitch response elicited;Palpable increased muscle length    Gluteus Medius Response Twitch response elicited;Palpable increased muscle length    Gluteus Maximus Response Twitch response elicited;Palpable increased muscle length    Piriformis Response Twitch response elicited;Palpable increased muscle length    Lumbar multifidi Response Twitch response elicited;Palpable increased muscle length                  PT Short Term Goals - 12/25/19 1417      PT SHORT TERM GOAL #2   Title report a 30% reduction in Lt hip and back pain with sitting and standing    Status Achieved             PT Long Term Goals - 11/27/19 1327      PT LONG TERM GOAL #1   Title be independent in advanced HEP    Time 8    Period Weeks    Status New    Target Date 01/22/20      PT LONG TERM GOAL #2   Title reduce FOTO to  < or = to 31% limitation    Baseline 49%    Time 8    Period Weeks    Status New    Target Date 01/22/20      PT LONG TERM GOAL #3   Title Pt will be able to sit for driving and watching TV up to 45 min with min pain    Baseline -    Time 8    Period Weeks    Status New    Target Date 01/22/20      PT LONG TERM GOAL #4   Title Improved sleep by 70%    Baseline -    Time 8    Period Weeks    Status New    Target Date 01/22/20      PT LONG TERM GOAL #5   Title Pt will be able to walk her dog with min pain    Baseline -    Time 8    Period Weeks    Status New    Target Date 01/22/20                 Plan - 12/25/19 1449    Clinical Impression Statement Pt arrived frustrated due to flare-up of lumbar and gluteal pain over the past few days.  PT spent session focusing on discussion of stretches and how to perform them regularly and gently.  Pt had been trying to maximize stretch to feel it in her lumbar/gluteal region and PT discussed the importance of gentle and consistent stretching to elongate both the painful area and adjacent areas.  Pt became tearful due to frustration.  Pt does report improved mobility and reduced tension in the quads and hip flexors after last session.  Manual therapy focused on bil gluteals and lumbar spine as this is the area of discomfort today.  Pt reported improved mobility and reduced tension after dry needling and manual therapy with multiple twitch responses in the region today.  Pt will continue to benefit from skilled PT to address hip and lumbar pain and allow for return to function.    PT Frequency 2x / week    PT Duration 8 weeks    PT Treatment/Interventions Joint Manipulations;Spinal  Manipulations;Taping;Dry needling;Passive range of motion;Manual techniques;Patient/family education;Neuromuscular re-education;Therapeutic exercise;Therapeutic activities;Functional mobility training;Electrical Stimulation;Cryotherapy;Moist Heat    PT Next  Visit Plan address pain and muscle tension, lumbopelvic strength and flexibility    PT Home Exercise Plan Access Code: [facility]    Recommended Other Services initial cert is signed    Consulted and Agree with Plan of Care Patient           Patient will benefit from skilled therapeutic intervention in order to improve the following deficits and impairments:  Abnormal gait, Decreased range of motion, Postural dysfunction, Decreased strength, Decreased mobility, Impaired flexibility, Increased muscle spasms, Pain  Visit Diagnosis: Muscle weakness (generalized)  Acute bilateral low back pain without sciatica  Cramp and spasm     Problem List Patient Active Problem List   Diagnosis Date Noted  . Vitamin D deficiency 12/03/2019  . Hyperlipidemia 12/03/2019  . 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 Hypertension 05/03/2016  . Low back pain 06/16/2014  . Anemia due to other cause 03/24/2014  . Routine general medical examination at a health care facility 03/24/2014  . VSD (ventricular septal defect) 03/09/2011  . URTICARIA 06/03/2008  . SEBACEOUS CYST 10/09/2007  . Asthma 10/31/2006     Lorrene Reid, PT 12/25/19 2:54 PM  [facility] [address] [place], [place], [address] Phone: [phone]   Fax:  [phone]  Name: Elizabeth Lozano MRN: [id number] Date of Birth: 08/18/1970

## 2020-01-01 ENCOUNTER — Other Ambulatory Visit: Payer: Self-pay

## 2020-01-01 ENCOUNTER — Encounter: Payer: Self-pay | Admitting: Physical Therapy

## 2020-01-01 ENCOUNTER — Ambulatory Visit: Payer: BC Managed Care – PPO | Admitting: Physical Therapy

## 2020-01-01 DIAGNOSIS — M545 Low back pain, unspecified: Secondary | ICD-10-CM

## 2020-01-01 DIAGNOSIS — R252 Cramp and spasm: Secondary | ICD-10-CM

## 2020-01-01 DIAGNOSIS — M6281 Muscle weakness (generalized): Secondary | ICD-10-CM

## 2020-01-01 NOTE — Therapy (Signed)
[facility] [address], [address] [place], [place], [address] Phone: [phone]   Fax:  [phone]  Physical Therapy Treatment  Patient Details  Name: Elizabeth Lozano MRN: [id number] Date of Birth: [birth year] Referring Provider (PT): Philip Aspen, Limmie Patricia, MD   Encounter Date: 01/01/2020   PT End of Session - 01/01/20 0931    Visit Number 6    Number of Visits 11    Date for PT Re-Evaluation 01/22/20    Authorization Type BCBS    Authorization Time Period 11 visits left out of 30 for the year - hard stop    Authorization - Visit Number 6    Authorization - Number of Visits 11    PT Start Time 0932    PT Stop Time 1014    PT Time Calculation (min) 42 min    Activity Tolerance Patient tolerated treatment well    Behavior During Therapy [facility] for tasks assessed/performed           Past Medical History:  Diagnosis Date  . Allergy    she has seen Dr. Eileen Stanford   . Anemia    long term issues per pt  . Anxiety    due to back pain   . Asthma   . Depression   . Heart murmur   . History of stress test    ETT-echo (9/14):  ST depression noted on ECGs; no wall motion abnormalities at peak exercise on echocardiogram  . Hypertension   . VSD (ventricular septal defect)    Cardiac CTA 05/2008: Membranous VSD, 8-9 mm in diameter, mild RVE, EF 64%, calcium score 0, no CAD. Last echo 02/2011: EF 55-60%, mild CAD, perimembranous VSD slightly more prominent but no RVE or pulmonary hypertension;  Echo (9/14): Small perimembranous VSD-no significant change since 02/2011; EF 55-60%, moderate LAE    Past Surgical History:  Procedure Laterality Date  . cyst left wrist    . dental implants    . WISDOM TOOTH EXTRACTION      There were no vitals filed for this visit.   Subjective Assessment - 01/01/20 0932    Subjective My glutes are the issue today. Able to figure 4 stretch today. Added a 3rd trip to the gym this week. Up to 15 min on  elliptical without hip flexors flaring up.    Pertinent History scoliosis, easily flares up    Limitations Sitting;Walking    How long can you sit comfortably? able to tolerate 10 min in car    Patient Stated Goals get back to being able to sleep through night, sit x 45 min, drive for errands without pain, do HEP with tolerance    Currently in Pain? Yes    Pain Score 4     Pain Location Buttocks    Pain Orientation Right;Left    Pain Descriptors / Indicators Burning                             [facility] Adult PT Treatment/Exercise - 01/01/20 0001      Lumbar Exercises: Stretches   Quad Stretch Right;Left;1 rep;20 seconds    Quad Stretch Limitations with pelvic tilt; standing      Manual Therapy   Manual Therapy Soft tissue mobilization;Joint mobilization    Manual therapy comments Skilled palpation and monitoring of soft tissues during DN    Joint Mobilization left UPA to lumbar to release hip flexor  Soft tissue mobilization to bil gluteals and lumbar            Trigger Point Dry Needling - 01/01/20 0001    Consent Given? Yes    Education Handout Provided Previously provided    Muscles Treated Back/Hip Gluteus minimus;Gluteus medius;Gluteus maximus    Gluteus Minimus Response Twitch response elicited;Palpable increased muscle length    Gluteus Medius Response Twitch response elicited;Palpable increased muscle length    Gluteus Maximus Response Twitch response elicited;Palpable increased muscle length                  PT Short Term Goals - 01/01/20 0937      PT SHORT TERM GOAL #3   Title Pt will achieve Lt hip flexibility to [facility] to reduce strain on Lt hemipelvis    Status On-going      PT SHORT TERM GOAL #4   Title Pt will demo symmetrical trunk SB to 15 deg bil    Baseline 20 deg left; 16 deg right    Status Achieved             PT Long Term Goals - 01/01/20 0940      PT LONG TERM GOAL #1   Title be independent in advanced HEP     Status On-going      PT LONG TERM GOAL #3   Title Pt will be able to sit for driving and watching TV up to 45 min with min pain    Status On-going      PT LONG TERM GOAL #4   Title Improved sleep by 70%    Baseline 80% better    Status Achieved      PT LONG TERM GOAL #5   Title Pt will be able to walk her dog with min pain    Baseline alot better 70% but depends on the day    Status On-going                 Plan - 01/01/20 1718    Clinical Impression Statement Patient is making significant progress with her short and LTGs. She is still having repeated tightness in bil gluteals left>right. We modified her quad stretch to get improved response today. She is stil stiff with PA mobs on the left.    Personal Factors and Comorbidities Time since onset of injury/illness/exacerbation    Examination-Activity Limitations Sit;Stand;Locomotion Level    PT Frequency 2x / week    PT Duration 8 weeks    PT Treatment/Interventions Joint Manipulations;Spinal Manipulations;Taping;Dry needling;Passive range of motion;Manual techniques;Patient/family education;Neuromuscular re-education;Therapeutic exercise;Therapeutic activities;Functional mobility training;Electrical Stimulation;Cryotherapy;Moist Heat    PT Next Visit Plan address pain and muscle tension, lumbopelvic strength and flexibility    PT Home Exercise Plan Access Code: [facility]    Consulted and Agree with Plan of Care Patient           Patient will benefit from skilled therapeutic intervention in order to improve the following deficits and impairments:  Abnormal gait, Decreased range of motion, Postural dysfunction, Decreased strength, Decreased mobility, Impaired flexibility, Increased muscle spasms, Pain  Visit Diagnosis: Muscle weakness (generalized)  Acute bilateral low back pain without sciatica  Cramp and spasm     Problem List Patient Active Problem List   Diagnosis Date Noted  . Vitamin D deficiency 12/03/2019   . Hyperlipidemia 12/03/2019  . 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 Hypertension 05/03/2016  . Low back pain 06/16/2014  . Anemia due to other cause 03/24/2014  .  Routine general medical examination at a health care facility 03/24/2014  . VSD (ventricular septal defect) 03/09/2011  . URTICARIA 06/03/2008  . SEBACEOUS CYST 10/09/2007  . Asthma 10/31/2006    Presley Raddle PT 01/01/2020, 5:22 PM  [facility] [address] [place], [place], [address] Phone: [phone]   Fax:  [phone]  Name: Elizabeth Lozano MRN: [id number] Date of Birth: 11/24/1970

## 2020-01-06 ENCOUNTER — Encounter: Payer: BC Managed Care – PPO | Admitting: Physical Therapy

## 2020-01-13 ENCOUNTER — Encounter: Payer: Self-pay | Admitting: Physical Therapy

## 2020-01-13 ENCOUNTER — Other Ambulatory Visit: Payer: Self-pay

## 2020-01-13 ENCOUNTER — Ambulatory Visit: Payer: BC Managed Care – PPO | Admitting: Physical Therapy

## 2020-01-13 DIAGNOSIS — R252 Cramp and spasm: Secondary | ICD-10-CM | POA: Diagnosis not present

## 2020-01-13 DIAGNOSIS — M6281 Muscle weakness (generalized): Secondary | ICD-10-CM

## 2020-01-13 DIAGNOSIS — M545 Low back pain, unspecified: Secondary | ICD-10-CM | POA: Diagnosis not present

## 2020-01-13 NOTE — Patient Instructions (Signed)
Access Code: [email or web address] URL: [email or web address] Date: 01/13/2020 Prepared by: Raynelle Fanning  Exercises Supine Hamstring Stretch with Strap - 2 x daily - 7 x weekly - 1 sets - 3 reps - 60 sec hold Supine Piriformis Stretch with Leg Straight - 2 x daily - 7 x weekly - 1 sets - 3 reps - 30-60 sec sec hold Seated Upper Trapezius Stretch - 2 x daily - 7 x weekly - 1 sets - 3 reps - 30-60 sec hold Seated Levator Scapulae Stretch - 2 x daily - 7 x weekly - 1 sets - 3 reps - 30-60 sec hold Supine Cervical Retraction with Towel - 1 x daily - 7 x weekly - 3 sets - 10 reps Standing Shoulder Horizontal Abduction with Resistance - 1 x daily - 7 x weekly - 1 sets - 10 reps Standing Row with Anchored Resistance - 1 x daily - 7 x weekly - 1 sets - 10 reps Standing Shoulder Extension with Resistance - 1 x daily - 7 x weekly - 1 sets - 10 reps Standing Quadratus Lumborum Stretch with Doorway - 2 x daily - 7 x weekly - 1 sets - 2 reps - 30-60 sec hold Prone Alternating Arm and Leg Lifts - 1 x daily - 7 x weekly - 10 reps - 3 sets - 5 sec hold Quadruped Alternating Leg Extensions - 1 x daily - 7 x weekly - 10 reps - 3 sets - 3 sec hold Hooklying Sequential Leg March and Lower - 1 x daily - 7 x weekly - 3 sets - 10 reps

## 2020-01-13 NOTE — Therapy (Signed)
[facility] [address], [place], [place], [address] Phone: [phone]   Fax:  [phone]  Physical Therapy Treatment  Patient Details  Name: Elizabeth Lozano MRN: [id number] Date of Birth: Mar 12, 1970 Referring Provider (PT): Isaac Bliss, Rayford Halsted, MD   Encounter Date: 01/13/2020   PT End of Session - 01/13/20 1147    Visit Number 7    Number of Visits 11    Date for PT Re-Evaluation 01/22/20    Authorization Type BCBS    Authorization Time Period 11 visits left out of 30 for the year - hard stop    Authorization - Visit Number 7    Authorization - Number of Visits 11    PT Start Time 1148    PT Stop Time 1229    PT Time Calculation (min) 41 min    Activity Tolerance Patient tolerated treatment well    Behavior During Therapy WFL for tasks assessed/performed           Past Medical History:  Diagnosis Date   Allergy    she has seen Dr. Tiajuana Amass    Anemia    long term issues per pt   Anxiety    due to back pain    Asthma    Depression    Heart murmur    History of stress test    ETT-echo (9/14):  ST depression noted on ECGs; no wall motion abnormalities at peak exercise on echocardiogram   Hypertension    VSD (ventricular septal defect)    Cardiac CTA 05/2008: Membranous VSD, 8-9 mm in diameter, mild RVE, EF 64%, calcium score 0, no CAD. Last echo 02/2011: EF 55-60%, mild CAD, perimembranous VSD slightly more prominent but no RVE or pulmonary hypertension;  Echo (9/14): Small perimembranous VSD-no significant change since 02/2011; EF 55-60%, moderate LAE    Past Surgical History:  Procedure Laterality Date   cyst left wrist     dental implants     WISDOM TOOTH EXTRACTION      There were no vitals filed for this visit.   Subjective Assessment - 01/13/20 1148    Subjective I'm having a bad day. Went to the gym yesterday. Up to 19 min on elliptical.    Patient Stated Goals get back to  being able to sleep through night, sit x 45 min, drive for errands without pain, do HEP with tolerance    Currently in Pain? Yes    Pain Score 4     Pain Location Buttocks    Pain Orientation Left    Pain Descriptors / Indicators Burning    Pain Type Acute pain                             [facility] Adult PT Treatment/Exercise - 01/13/20 0001      Lumbar Exercises: Stretches   Other Lumbar Stretch Exercise pigeon and modified pigeon stretches bil x 30 sec      Knee/Hip Exercises: Stretches   Hip Flexor Stretch Right;Left;1 rep;20 seconds    Hip Flexor Stretch Limitations standing      Knee/Hip Exercises: Supine   Other Supine Knee/Hip Exercises 90/90 posiiton; yellow loop around feet; isometric hip flexor with opp leg extension 2x5 bil      Knee/Hip Exercises: Sidelying   Hip ABduction Left;10 reps    Hip ABduction Limitations jackknife position; then bicycle and reverse bicycle x 5 each; small circles x  10 forward and 4 bwd due to fatigue; taps front and back x 1 rep    Clams 5 reps      Manual Therapy   Manual Therapy Soft tissue mobilization;Joint mobilization;Myofascial release    Manual therapy comments passive left ADDuctor stretch (butterfly)    Soft tissue mobilization to left gluteals using Jstrokes and TPR    Myofascial Release to left ITB                  PT Education - 01/13/20 1454    Education Details HEP modified and progressed    Methods Explanation;Demonstration;Handout;Verbal cues    Comprehension Verbalized understanding;Returned demonstration            PT Short Term Goals - 01/13/20 1459      PT SHORT TERM GOAL #3   Title Pt will achieve Lt hip flexibility to [facility] to reduce strain on Lt hemipelvis    Baseline still tight in left piriformis and adductors    Status Partially Met             PT Long Term Goals - 01/01/20 0940      PT LONG TERM GOAL #1   Title be independent in advanced HEP    Status On-going      PT  LONG TERM GOAL #3   Title Pt will be able to sit for driving and watching TV up to 45 min with min pain    Status On-going      PT LONG TERM GOAL #4   Title Improved sleep by 70%    Baseline 80% better    Status Achieved      PT LONG TERM GOAL #5   Title Pt will be able to walk her dog with min pain    Baseline alot better 70% but depends on the day    Status On-going                 Plan - 01/13/20 1237    Clinical Impression Statement Patient presents today reporting continued overall improvements with pain , but having a bad day today. She is slow to push herself with hip strengthening due to fear of having a flare up, but she agrees to progress some in order to make further gains. HEP progressed with strengthening for hip flexors and ABDuctors and new piriformis stretch issued. Patient fatigues easily with hip ABD exercises and clams.    Personal Factors and Comorbidities Time since onset of injury/illness/exacerbation    Examination-Activity Limitations Sit;Stand;Locomotion Level    PT Frequency 2x / week    PT Duration 8 weeks    PT Treatment/Interventions Joint Manipulations;Spinal Manipulations;Taping;Dry needling;Passive range of motion;Manual techniques;Patient/family education;Neuromuscular re-education;Therapeutic exercise;Therapeutic activities;Functional mobility training;Electrical Stimulation;Cryotherapy;Moist Heat    PT Next Visit Plan push hip ABD with different angles; address pain and muscle tension, lumbopelvic strength and flexibility    PT Home Exercise Plan Access Code: [facility]    Consulted and Agree with Plan of Care Patient           Patient will benefit from skilled therapeutic intervention in order to improve the following deficits and impairments:  Abnormal gait, Decreased range of motion, Postural dysfunction, Decreased strength, Decreased mobility, Impaired flexibility, Increased muscle spasms, Pain  Visit Diagnosis: Muscle weakness  (generalized)  Acute bilateral low back pain without sciatica  Cramp and spasm     Problem List Patient Active Problem List   Diagnosis Date Noted   Vitamin D deficiency 12/03/2019  Hyperlipidemia 12/03/2019   Low blood monocyte count 07/06/2016   Hypertension 05/03/2016   Low back pain 06/16/2014   Anemia due to other cause 03/24/2014   Routine general medical examination at a health care facility 03/24/2014   VSD (ventricular septal defect) 03/09/2011   URTICARIA 06/03/2008   SEBACEOUS CYST 10/09/2007   Asthma 10/31/2006    Madelyn Flavors PT 01/13/2020, 3:03 PM  [facility] [address] [place], [place], [address] Phone: [phone]   Fax:  [phone]  Name: Elizabeth Lozano MRN: [id number] Date of Birth: July 23, 1970

## 2020-01-21 ENCOUNTER — Telehealth: Payer: Self-pay | Admitting: Internal Medicine

## 2020-01-21 NOTE — Telephone Encounter (Signed)
Patient is calling and wanted to know when she was supposed to get her 2ns shingles vaccine, please advise. CB is [phone]

## 2020-01-21 NOTE — Telephone Encounter (Signed)
Patient is aware to schedule an appointment 2-6 months after 1st shingles vaccine.

## 2020-01-23 ENCOUNTER — Ambulatory Visit: Payer: BC Managed Care – PPO | Attending: Internal Medicine

## 2020-01-23 ENCOUNTER — Other Ambulatory Visit: Payer: Self-pay

## 2020-01-23 DIAGNOSIS — M545 Low back pain, unspecified: Secondary | ICD-10-CM | POA: Diagnosis not present

## 2020-01-23 DIAGNOSIS — M6281 Muscle weakness (generalized): Secondary | ICD-10-CM | POA: Insufficient documentation

## 2020-01-23 DIAGNOSIS — R252 Cramp and spasm: Secondary | ICD-10-CM | POA: Diagnosis not present

## 2020-01-23 NOTE — Therapy (Signed)
[facility] [address], [place], [place], [address] Phone: [phone]   Fax:  [phone]  Physical Therapy Treatment  Patient Details  Name: Elizabeth Lozano MRN: [id number] Date of Birth: Dec 23, 1970 Referring Provider (PT): Isaac Bliss, Rayford Halsted, MD   Encounter Date: 01/23/2020   PT End of Session - 01/23/20 1225    Visit Number 8    Date for PT Re-Evaluation 03/19/20    Authorization Type BCBS    Authorization - Visit Number 8    Authorization - Number of Visits 11    PT Start Time 2395    PT Stop Time 1225    PT Time Calculation (min) 39 min    Activity Tolerance Patient tolerated treatment well    Behavior During Therapy [facility] for tasks assessed/performed           Past Medical History:  Diagnosis Date  . Allergy    she has seen Dr. Tiajuana Amass   . Anemia    long term issues per pt  . Anxiety    due to back pain   . Asthma   . Depression   . Heart murmur   . History of stress test    ETT-echo (9/14):  ST depression noted on ECGs; no wall motion abnormalities at peak exercise on echocardiogram  . Hypertension   . VSD (ventricular septal defect)    Cardiac CTA 05/2008: Membranous VSD, 8-9 mm in diameter, mild RVE, EF 64%, calcium score 0, no CAD. Last echo 02/2011: EF 55-60%, mild CAD, perimembranous VSD slightly more prominent but no RVE or pulmonary hypertension;  Echo (9/14): Small perimembranous VSD-no significant change since 02/2011; EF 55-60%, moderate LAE    Past Surgical History:  Procedure Laterality Date  . cyst left wrist    . dental implants    . WISDOM TOOTH EXTRACTION      There were no vitals filed for this visit.   Subjective Assessment - 01/23/20 1151    Subjective My Lt hip flexor is feeling tighter than it was.  80% overall improvement.    Patient Stated Goals get back to being able to sleep through night, sit x 45 min, drive for errands without pain, do HEP with tolerance     Currently in Pain? Yes    Pain Score 1    up to 6/10   Pain Location Buttocks    Pain Orientation Left    Pain Type Acute pain    Pain Onset More than a month ago    Pain Frequency Constant    Aggravating Factors  standing/doing too much, driving car (stick shift), sitting too long    Pain Relieving Factors stretching, change position, rest              [facility] PT Assessment - 01/23/20 0001      Assessment   Medical Diagnosis M54.5 (ICD-10-CM) - Left-sided low back pain without sciatica, unspecified chronicity    Referring Provider (PT) Isaac Bliss, Rayford Halsted, MD    Onset Date/Surgical Date --   years ago, flared up doing side planks     Prior Function   Level of Independence Independent    Vocation Unemployed    Leisure limited by pain, TV      Cognition   Overall Cognitive Status Within Functional Limits for tasks assessed      Observation/Other Assessments   Focus on Therapeutic Outcomes (FOTO)  37% limitation  [facility] Adult PT Treatment/Exercise - 01/23/20 0001      Lumbar Exercises: Stretches   Other Lumbar Stretch Exercise pigeon and modified pigeon stretches bil x 30 sec    Other Lumbar Stretch Exercise frog stretch 3x20 seconds      Knee/Hip Exercises: Stretches   Hip Flexor Stretch Right;Left;1 rep;20 seconds    Hip Flexor Stretch Limitations standing    ITB Stretch Left;Right;2 reps;10 seconds    ITB Stretch Limitations using step and cross midline      Knee/Hip Exercises: Supine   Other Supine Knee/Hip Exercises 90/90 posiiton; yellow loop around feet; isometric hip flexor with opp leg extension 2x5 bil      Knee/Hip Exercises: Sidelying   Hip ABduction Left;10 reps    Hip ABduction Limitations jackknife position; then bicycle and reverse bicycle x 5 each; small circles x 10 forward and 4 bwd due to fatigue; taps front and back x 1 rep    Clams 5 reps                    PT Short Term Goals - 01/13/20  1459      PT SHORT TERM GOAL #3   Title Pt will achieve Lt hip flexibility to [facility] to reduce strain on Lt hemipelvis    Baseline still tight in left piriformis and adductors    Status Partially Met             PT Long Term Goals - 01/23/20 1150      PT LONG TERM GOAL #1   Title be independent in advanced HEP    Time 8    Period Weeks    Status On-going    Target Date 03/19/20      PT LONG TERM GOAL #3   Title Pt will be able to sit for driving and watching TV up to 30 min with min pain    Baseline 15 minutes-this is still very limited- pt is not able to quantify the pain level with sitting "I feel pain immediately"    Time 8    Period Weeks    Status Revised    Target Date 03/19/20      PT LONG TERM GOAL #4   Title Improved sleep by 70%    Baseline 95% better    Status Achieved      PT LONG TERM GOAL #5   Title Pt will be able to walk her dog with min pain    Baseline 95% better    Status Achieved      Additional Long Term Goals   Additional Long Term Goals Yes                 Plan - 01/23/20 1224    Clinical Impression Statement Pt reports 80% overall improvement in symptoms since the start of care.  Pt is continuing to build her gym exercise program and is up to 20 minutes on the elliptical.  Pt is able to sleep and walk her dog without limitation.  Pt is now most limited by sitting as this creates increased pain in her hip flexors.  Pt is limited to sitting 15 minutes for both driving and watching TV.  Pt remains weak in her hip abductors and fatigues easily with strength exercises.  Pt reports max of 6/10 Lt>Rt gluteal and hip flexor pain that is aggravated mostly with sitting.  FOTO is improved to 37% limitation and is moving toward goal of 31% limitation.  Pt required tactile and verbal cues for technique with HEP today and PT demonstrated and worked through best stretch positions for pigeon and ITB stretches.  Pt will continue to benefit from skilled PT to  address core stabilization and tissue mobility associated with pain.    PT Frequency 2x / week    PT Duration 8 weeks    PT Treatment/Interventions Joint Manipulations;Spinal Manipulations;Taping;Dry needling;Passive range of motion;Manual techniques;Patient/family education;Neuromuscular re-education;Therapeutic exercise;Therapeutic activities;Functional mobility training;Electrical Stimulation;Cryotherapy;Moist Heat    PT Next Visit Plan review sidelying exercise again, lumbopelvic strength, probable D/C at the end of December.    PT Home Exercise Plan Access Code: [facility]    Recommended Other Services recert sent 97/0/26    Consulted and Agree with Plan of Care Patient           Patient will benefit from skilled therapeutic intervention in order to improve the following deficits and impairments:  Abnormal gait,Decreased range of motion,Postural dysfunction,Decreased strength,Decreased mobility,Impaired flexibility,Increased muscle spasms,Pain  Visit Diagnosis: Muscle weakness (generalized) - Plan: PT plan of care cert/re-cert  Acute bilateral low back pain without sciatica - Plan: PT plan of care cert/re-cert  Cramp and spasm - Plan: PT plan of care cert/re-cert     Problem List Patient Active Problem List   Diagnosis Date Noted  . Vitamin D deficiency 12/03/2019  . Hyperlipidemia 12/03/2019  . 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 Hypertension 05/03/2016  . Low back pain 06/16/2014  . Anemia due to other cause 03/24/2014  . Routine general medical examination at a health care facility 03/24/2014  . VSD (ventricular septal defect) 03/09/2011  . URTICARIA 06/03/2008  . SEBACEOUS CYST 10/09/2007  . Asthma 10/31/2006     Sigurd Sos, PT 01/23/20 12:28 PM  [facility] [address], [place], [place], [address] Phone: [phone]   Fax:  [phone]  Name: Elizabeth Lozano MRN: [id number] Date of Birth:  1970-03-10

## 2020-02-10 ENCOUNTER — Encounter: Payer: Self-pay | Admitting: Internal Medicine

## 2020-02-10 DIAGNOSIS — M79672 Pain in left foot: Secondary | ICD-10-CM

## 2020-02-13 ENCOUNTER — Ambulatory Visit: Payer: BC Managed Care – PPO

## 2020-02-13 ENCOUNTER — Other Ambulatory Visit: Payer: Self-pay

## 2020-02-13 DIAGNOSIS — M6281 Muscle weakness (generalized): Secondary | ICD-10-CM | POA: Diagnosis not present

## 2020-02-13 DIAGNOSIS — R252 Cramp and spasm: Secondary | ICD-10-CM | POA: Diagnosis not present

## 2020-02-13 DIAGNOSIS — M545 Low back pain, unspecified: Secondary | ICD-10-CM

## 2020-02-13 NOTE — Therapy (Signed)
[facility] [address], [place], [place], [address] Phone: [phone]   Fax:  [phone]  Physical Therapy Treatment  Patient Details  Name: Elizabeth Lozano MRN: [id number] Date of Birth: April 06, 1970 Referring Provider (PT): Isaac Bliss, Rayford Halsted, MD   Encounter Date: 02/13/2020   PT End of Session - 02/13/20 0801    Visit Number 9    Authorization - Visit Number 9    Authorization - Number of Visits 11    PT Start Time 3846    PT Stop Time 0754    PT Time Calculation (min) 23 min    Activity Tolerance Patient tolerated treatment well    Behavior During Therapy [facility] for tasks assessed/performed           Past Medical History:  Diagnosis Date   Allergy    she has seen Dr. Tiajuana Amass    Anemia    long term issues per pt   Anxiety    due to back pain    Asthma    Depression    Heart murmur    History of stress test    ETT-echo (9/14):  ST depression noted on ECGs; no wall motion abnormalities at peak exercise on echocardiogram   Hypertension    VSD (ventricular septal defect)    Cardiac CTA 05/2008: Membranous VSD, 8-9 mm in diameter, mild RVE, EF 64%, calcium score 0, no CAD. Last echo 02/2011: EF 55-60%, mild CAD, perimembranous VSD slightly more prominent but no RVE or pulmonary hypertension;  Echo (9/14): Small perimembranous VSD-no significant change since 02/2011; EF 55-60%, moderate LAE    Past Surgical History:  Procedure Laterality Date   cyst left wrist     dental implants     WISDOM TOOTH EXTRACTION      There were no vitals filed for this visit.   Subjective Assessment - 02/13/20 0736    Subjective I am ready to be discharged.  I got a new car and no longer have to press on the clutch.  I can do all of my exercises at home.  I have stiffness but no significant pain.    Patient Stated Goals get back to being able to sleep through night, sit x 45 min, drive for errands without pain,  do HEP with tolerance    Currently in Pain? No/denies              [facility] PT Assessment - 02/13/20 0001      Assessment   Medical Diagnosis M54.5 (ICD-10-CM) - Left-sided low back pain without sciatica, unspecified chronicity    Referring Provider (PT) Isaac Bliss, Rayford Halsted, MD      Cognition   Overall Cognitive Status Within Functional Limits for tasks assessed      Observation/Other Assessments   Focus on Therapeutic Outcomes (FOTO)  28% limitation                                 PT Education - 02/13/20 0757    Education Details verbal review of all HEP using visuals, plank progression- start with knees bent, increase time, extend 1 leg, extend 2 legs, stay on elbow    Person(s) Educated Patient    Methods Explanation;Demonstration;Handout    Comprehension Verbalized understanding;Returned demonstration            PT Short Term Goals - 01/13/20 1459      PT SHORT  TERM GOAL #3   Title Pt will achieve Lt hip flexibility to [facility] to reduce strain on Lt hemipelvis    Baseline still tight in left piriformis and adductors    Status Partially Met             PT Long Term Goals - 02/13/20 0735      PT LONG TERM GOAL #1   Title be independent in advanced HEP    Status Achieved      PT LONG TERM GOAL #2   Title reduce FOTO to < or = to 31% limitation    Baseline 28% limitation    Status Achieved      PT LONG TERM GOAL #3   Title Pt will be able to sit for driving and watching TV up to 30 min with min pain    Baseline able to sit for 23-30 minutes    Status Achieved      PT LONG TERM GOAL #4   Title Improved sleep by 70%    Status Achieved      PT LONG TERM GOAL #5   Title Pt will be able to walk her dog with min pain    Baseline no pain with this.                 Plan - 02/13/20 0800    Clinical Impression Statement Pt arrived today without pain and ready for D/C to HEP.  Pt is able to sit for 30 minutes, walk her dog, and  sleep without limitation related to pain.  Pt got a new car and is no longer shifting gears and she feels this has been a contributing factor to her pain.  PT spent session reviewing all HEP and discussing how to begin and progress a side plank.  Pt will D/C to HEP today and follow-up with MD as needed.    PT Next Visit Plan D/C PT to HEP    PT Home Exercise Plan Access Code: [facility]    Consulted and Agree with Plan of Care Patient           Patient will benefit from skilled therapeutic intervention in order to improve the following deficits and impairments:     Visit Diagnosis: Muscle weakness (generalized)  Acute bilateral low back pain without sciatica  Cramp and spasm     Problem List Patient Active Problem List   Diagnosis Date Noted   Vitamin D deficiency 12/03/2019   Hyperlipidemia 12/03/2019   Low blood monocyte count 07/06/2016   Hypertension 05/03/2016   Low back pain 06/16/2014   Anemia due to other cause 03/24/2014   Routine general medical examination at a health care facility 03/24/2014   VSD (ventricular septal defect) 03/09/2011   URTICARIA 06/03/2008   SEBACEOUS CYST 10/09/2007   Asthma 10/31/2006   PHYSICAL THERAPY DISCHARGE SUMMARY  Visits from Start of Care: 9  Current functional level related to goals / functional outcomes: See above for current PT status.  Pt has met all goals and denies any pain, just stiffness.     Remaining deficits: Glutal/pelvic stiffness that pt manages with flexibility exercises.    Education / Equipment: HEP, posture/body mechanics Plan: Patient agrees to discharge.  Patient goals were met. Patient is being discharged due to meeting the stated rehab goals.  ?????         Sigurd Sos, PT 02/13/20 8:02 AM  [facility] [address], [place], [place],  [address] Phone: [phone]   Fax:  [phone]  Name: Elizabeth Lozano MRN:  [id number] Date of Birth: 04-21-1970

## 2020-02-17 ENCOUNTER — Ambulatory Visit: Payer: BC Managed Care – PPO | Admitting: Podiatry

## 2020-02-18 ENCOUNTER — Ambulatory Visit: Payer: BC Managed Care – PPO | Admitting: Podiatry

## 2020-02-18 ENCOUNTER — Encounter: Payer: Self-pay | Admitting: Internal Medicine

## 2020-02-18 DIAGNOSIS — E559 Vitamin D deficiency, unspecified: Secondary | ICD-10-CM

## 2020-02-19 MED ORDER — VITAMIN D (ERGOCALCIFEROL) 1.25 MG (50000 UNIT) PO CAPS: 50000.0000 [IU] | ORAL_CAPSULE | ORAL | 0 refills | Status: DC

## 2020-02-24 ENCOUNTER — Encounter: Payer: Self-pay | Admitting: Podiatry

## 2020-02-24 ENCOUNTER — Other Ambulatory Visit: Payer: Self-pay | Admitting: Internal Medicine

## 2020-02-24 ENCOUNTER — Other Ambulatory Visit: Payer: Self-pay | Admitting: Podiatry

## 2020-02-24 ENCOUNTER — Ambulatory Visit (INDEPENDENT_AMBULATORY_CARE_PROVIDER_SITE_OTHER): Payer: BC Managed Care – PPO

## 2020-02-24 ENCOUNTER — Ambulatory Visit: Payer: BC Managed Care – PPO | Admitting: Podiatry

## 2020-02-24 ENCOUNTER — Other Ambulatory Visit: Payer: Self-pay

## 2020-02-24 DIAGNOSIS — M779 Enthesopathy, unspecified: Secondary | ICD-10-CM | POA: Diagnosis not present

## 2020-02-24 DIAGNOSIS — Q6671 Congenital pes cavus, right foot: Secondary | ICD-10-CM

## 2020-02-24 DIAGNOSIS — M216X9 Other acquired deformities of unspecified foot: Secondary | ICD-10-CM

## 2020-02-24 DIAGNOSIS — M722 Plantar fascial fibromatosis: Secondary | ICD-10-CM

## 2020-02-24 DIAGNOSIS — Q6672 Congenital pes cavus, left foot: Secondary | ICD-10-CM | POA: Diagnosis not present

## 2020-02-24 DIAGNOSIS — E559 Vitamin D deficiency, unspecified: Secondary | ICD-10-CM

## 2020-02-25 MED ORDER — VITAMIN D (ERGOCALCIFEROL) 1.25 MG (50000 UNIT) PO CAPS: 50000.0000 [IU] | ORAL_CAPSULE | ORAL | 0 refills | Status: AC

## 2020-03-02 NOTE — Progress Notes (Signed)
Subjective:   Patient ID: Elizabeth Lozano, female   DOB: 50 y.o.   MRN: [id number]   HPI 50 year old female presents the office today for concerns of "high arches" and "weak ankles".  She states that she is not having specific pain to the feet however she has a history of orthotics about 40 years ago.  She wants to discuss different shoes and talk about orthotics.  She is previous been physical therapy for her left hip flexors for about a year.  She denies recent injury or trauma to her feet.  She does feel that her feet are was causing her other leg pain.  She states that her arches have been the same for many years and has not changed or worsened.  No other concerns today.  Review of Systems  All other systems reviewed and are negative.  Past Medical History:  Diagnosis Date  . Allergy    she has seen Dr. Eileen Lozano   . Anemia    long term issues per pt  . Anxiety    due to back pain   . Asthma   . Depression   . Heart murmur   . History of stress test    ETT-echo (9/14):  ST depression noted on ECGs; no wall motion abnormalities at peak exercise on echocardiogram  . Hypertension   . VSD (ventricular septal defect)    Cardiac CTA 05/2008: Membranous VSD, 8-9 mm in diameter, mild RVE, EF 64%, calcium score 0, no CAD. Last echo 02/2011: EF 55-60%, mild CAD, perimembranous VSD slightly more prominent but no RVE or pulmonary hypertension;  Echo (9/14): Small perimembranous VSD-no significant change since 02/2011; EF 55-60%, moderate LAE    Past Surgical History:  Procedure Laterality Date  . cyst left wrist    . dental implants    . WISDOM TOOTH EXTRACTION       Current Outpatient Medications:  .  acetaminophen (TYLENOL) 500 MG tablet, Take 500 mg by mouth every 6 (six) hours as needed. (Patient not taking: Reported on 11/29/2019), Disp: , Rfl:  .  cetirizine (ZYRTEC) 10 MG tablet, Take 10 mg by mouth daily., Disp: , Rfl:  .  cyanocobalamin 1000 MCG tablet, Take 1,000 mcg by mouth daily.,  Disp: , Rfl:  .  MAGNESIUM PO, Take 200 mg by mouth daily. , Disp: , Rfl:  .  Multiple Vitamin (MULTIVITAMIN) tablet, Take 1 tablet by mouth daily. Centrum Mini's for Women 50 Plus, Disp: , Rfl:  .  norethindrone (MICRONOR) 0.35 MG tablet, Take 1 tablet by mouth daily., Disp: , Rfl:  .  Vitamin D, Ergocalciferol, (DRISDOL) 1.25 MG (50000 UNIT) CAPS capsule, Take 1 capsule (50,000 Units total) by mouth every 7 (seven) days for 12 doses., Disp: 12 capsule, Rfl: 0  Allergies  Allergen Reactions  . Erythromycin Nausea And Vomiting  . Iohexol Hives     Code: HIVES, Desc: RN Elizabeth Lozano NOTICED HIVE ON STOMACH AFTER HEART SCAN. ALSO ONE ON BACK. NOT ITCHING UNTIL AFTER SHE KNEW OF HIVES. DR.NISHAN NOTIFIED. GIVEN BENADRYL., Onset Date: [id number]   . Tetracycline Hives         Objective:  Physical Exam  General: AAO x3, NAD  Dermatological: Skin is warm, dry and supple bilateral. There are no open sores, no preulcerative lesions, no rash or signs of infection present.  Vascular: Dorsalis Pedis artery and Posterior Tibial artery pedal pulses are 2/4 bilateral with immedate capillary fill time. There is no pain with calf compression, swelling, warmth, erythema.  Neruologic: Grossly intact via light touch bilateral. Negative tinel sign.    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  There is no area of tenderness identified today.  No edema, erythema.  Muscular strength 5/5 in all groups tested bilateral.  Gait: Unassisted, Nonantalgic.       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  Cavus foot type is evident. -We discussed modifications, orthotics.  I had her evaluated today by our pedorthotist, Raiford Noble, to get measured for orthotics. -Continue physical therapy for her  hip  Vivi Barrack DPM

## 2020-03-03 ENCOUNTER — Other Ambulatory Visit: Payer: Self-pay

## 2020-03-16 ENCOUNTER — Ambulatory Visit: Payer: BC Managed Care – PPO

## 2020-03-20 ENCOUNTER — Telehealth: Payer: Self-pay | Admitting: Orthotics

## 2020-03-20 NOTE — Addendum Note (Signed)
Addended by: Lerry Liner on: 03/20/2020 03:25 PM   Modules accepted: Orders

## 2020-03-20 NOTE — Addendum Note (Signed)
Addended by: Lerry Liner on: 03/20/2020 03:24 PM   Modules accepted: Orders

## 2020-03-20 NOTE — Telephone Encounter (Signed)
Patient called in expressing concerns about an appointment to pick up orthotics, called patient back, LVM stating if they weren't ready before appointment patient would then receive call to reschedule appointment

## 2020-03-23 ENCOUNTER — Other Ambulatory Visit: Payer: BC Managed Care – PPO

## 2020-03-23 ENCOUNTER — Ambulatory Visit: Payer: BC Managed Care – PPO

## 2020-03-24 ENCOUNTER — Other Ambulatory Visit: Payer: Self-pay

## 2020-03-24 ENCOUNTER — Other Ambulatory Visit (INDEPENDENT_AMBULATORY_CARE_PROVIDER_SITE_OTHER): Payer: BC Managed Care – PPO

## 2020-03-24 DIAGNOSIS — E559 Vitamin D deficiency, unspecified: Secondary | ICD-10-CM | POA: Diagnosis not present

## 2020-03-24 LAB — VITAMIN D 25 HYDROXY (VIT D DEFICIENCY, FRACTURES): VITD: 53.86 ng/mL (ref 30.00–100.00)

## 2020-03-26 ENCOUNTER — Encounter: Payer: Self-pay | Admitting: Internal Medicine

## 2020-03-30 ENCOUNTER — Other Ambulatory Visit: Payer: Self-pay

## 2020-03-30 ENCOUNTER — Ambulatory Visit: Payer: BC Managed Care – PPO | Admitting: Orthotics

## 2020-03-30 DIAGNOSIS — M779 Enthesopathy, unspecified: Secondary | ICD-10-CM

## 2020-03-30 DIAGNOSIS — M216X9 Other acquired deformities of unspecified foot: Secondary | ICD-10-CM

## 2020-03-30 NOTE — Progress Notes (Signed)
Patient picked up f/o and was pleased with fit, comfort, and function.  Worked well with footwear.  Told of rbeak in period and how to report any issues.  

## 2020-04-02 ENCOUNTER — Other Ambulatory Visit: Payer: Self-pay

## 2020-04-02 ENCOUNTER — Ambulatory Visit (INDEPENDENT_AMBULATORY_CARE_PROVIDER_SITE_OTHER): Payer: BC Managed Care – PPO | Admitting: *Deleted

## 2020-04-02 DIAGNOSIS — Z23 Encounter for immunization: Secondary | ICD-10-CM

## 2020-05-11 DIAGNOSIS — M461 Sacroiliitis, not elsewhere classified: Secondary | ICD-10-CM | POA: Diagnosis not present

## 2020-05-11 DIAGNOSIS — M25552 Pain in left hip: Secondary | ICD-10-CM | POA: Diagnosis not present

## 2020-05-11 DIAGNOSIS — M791 Myalgia, unspecified site: Secondary | ICD-10-CM | POA: Diagnosis not present

## 2020-05-11 DIAGNOSIS — R2689 Other abnormalities of gait and mobility: Secondary | ICD-10-CM | POA: Diagnosis not present

## 2020-05-18 DIAGNOSIS — M461 Sacroiliitis, not elsewhere classified: Secondary | ICD-10-CM | POA: Diagnosis not present

## 2020-05-18 DIAGNOSIS — R2689 Other abnormalities of gait and mobility: Secondary | ICD-10-CM | POA: Diagnosis not present

## 2020-05-18 DIAGNOSIS — M25552 Pain in left hip: Secondary | ICD-10-CM | POA: Diagnosis not present

## 2020-05-18 DIAGNOSIS — M791 Myalgia, unspecified site: Secondary | ICD-10-CM | POA: Diagnosis not present

## 2020-05-25 DIAGNOSIS — M25552 Pain in left hip: Secondary | ICD-10-CM | POA: Diagnosis not present

## 2020-05-25 DIAGNOSIS — M461 Sacroiliitis, not elsewhere classified: Secondary | ICD-10-CM | POA: Diagnosis not present

## 2020-05-25 DIAGNOSIS — M791 Myalgia, unspecified site: Secondary | ICD-10-CM | POA: Diagnosis not present

## 2020-05-25 DIAGNOSIS — R2689 Other abnormalities of gait and mobility: Secondary | ICD-10-CM | POA: Diagnosis not present

## 2020-05-28 DIAGNOSIS — M791 Myalgia, unspecified site: Secondary | ICD-10-CM | POA: Diagnosis not present

## 2020-05-28 DIAGNOSIS — M461 Sacroiliitis, not elsewhere classified: Secondary | ICD-10-CM | POA: Diagnosis not present

## 2020-05-28 DIAGNOSIS — M25552 Pain in left hip: Secondary | ICD-10-CM | POA: Diagnosis not present

## 2020-05-28 DIAGNOSIS — R2689 Other abnormalities of gait and mobility: Secondary | ICD-10-CM | POA: Diagnosis not present

## 2020-06-01 ENCOUNTER — Other Ambulatory Visit (INDEPENDENT_AMBULATORY_CARE_PROVIDER_SITE_OTHER): Payer: BC Managed Care – PPO

## 2020-06-01 ENCOUNTER — Other Ambulatory Visit: Payer: Self-pay

## 2020-06-01 DIAGNOSIS — E785 Hyperlipidemia, unspecified: Secondary | ICD-10-CM | POA: Diagnosis not present

## 2020-06-01 DIAGNOSIS — M25552 Pain in left hip: Secondary | ICD-10-CM | POA: Diagnosis not present

## 2020-06-01 DIAGNOSIS — M461 Sacroiliitis, not elsewhere classified: Secondary | ICD-10-CM | POA: Diagnosis not present

## 2020-06-01 DIAGNOSIS — M791 Myalgia, unspecified site: Secondary | ICD-10-CM | POA: Diagnosis not present

## 2020-06-01 DIAGNOSIS — R2689 Other abnormalities of gait and mobility: Secondary | ICD-10-CM | POA: Diagnosis not present

## 2020-06-01 LAB — LIPID PANEL
Cholesterol: 165 mg/dL (ref 0–200)
HDL: 55.9 mg/dL (ref >39.00)
LDL Cholesterol: 97 mg/dL (ref 0–99)
NonHDL: 109.09
Total CHOL/HDL Ratio: 3
Triglycerides: 58 mg/dL (ref 0.0–149.0)
VLDL: 11.6 mg/dL (ref 0.0–40.0)

## 2020-06-04 DIAGNOSIS — M461 Sacroiliitis, not elsewhere classified: Secondary | ICD-10-CM | POA: Diagnosis not present

## 2020-06-04 DIAGNOSIS — M25552 Pain in left hip: Secondary | ICD-10-CM | POA: Diagnosis not present

## 2020-06-04 DIAGNOSIS — M791 Myalgia, unspecified site: Secondary | ICD-10-CM | POA: Diagnosis not present

## 2020-06-04 DIAGNOSIS — R2689 Other abnormalities of gait and mobility: Secondary | ICD-10-CM | POA: Diagnosis not present

## 2020-06-08 ENCOUNTER — Encounter: Payer: Self-pay | Admitting: Internal Medicine

## 2020-06-08 DIAGNOSIS — M791 Myalgia, unspecified site: Secondary | ICD-10-CM | POA: Diagnosis not present

## 2020-06-08 DIAGNOSIS — M461 Sacroiliitis, not elsewhere classified: Secondary | ICD-10-CM | POA: Diagnosis not present

## 2020-06-08 DIAGNOSIS — R2689 Other abnormalities of gait and mobility: Secondary | ICD-10-CM | POA: Diagnosis not present

## 2020-06-08 DIAGNOSIS — M25552 Pain in left hip: Secondary | ICD-10-CM | POA: Diagnosis not present

## 2020-06-08 DIAGNOSIS — M545 Low back pain, unspecified: Secondary | ICD-10-CM

## 2020-06-11 DIAGNOSIS — M25552 Pain in left hip: Secondary | ICD-10-CM | POA: Diagnosis not present

## 2020-06-11 DIAGNOSIS — M461 Sacroiliitis, not elsewhere classified: Secondary | ICD-10-CM | POA: Diagnosis not present

## 2020-06-11 DIAGNOSIS — R2689 Other abnormalities of gait and mobility: Secondary | ICD-10-CM | POA: Diagnosis not present

## 2020-06-11 DIAGNOSIS — M791 Myalgia, unspecified site: Secondary | ICD-10-CM | POA: Diagnosis not present

## 2020-06-16 ENCOUNTER — Other Ambulatory Visit: Payer: Self-pay

## 2020-06-16 ENCOUNTER — Ambulatory Visit: Payer: BC Managed Care – PPO | Attending: Internal Medicine | Admitting: Physical Therapy

## 2020-06-16 ENCOUNTER — Encounter: Payer: Self-pay | Admitting: Physical Therapy

## 2020-06-16 DIAGNOSIS — M6281 Muscle weakness (generalized): Secondary | ICD-10-CM | POA: Diagnosis not present

## 2020-06-16 DIAGNOSIS — R252 Cramp and spasm: Secondary | ICD-10-CM | POA: Diagnosis not present

## 2020-06-16 DIAGNOSIS — M545 Low back pain, unspecified: Secondary | ICD-10-CM | POA: Insufficient documentation

## 2020-06-16 NOTE — Therapy (Signed)
[facility] [address] [place], [place], [address] Phone: [phone]   Fax:  [phone]  Physical Therapy Evaluation  Patient Details  Name: Elizabeth Lozano MRN: [id number] Date of Birth: 28-Apr-1970 Referring Provider (PT): Dr Philip Aspen   Encounter Date: 06/16/2020   PT End of Session - 06/16/20 2101    Visit Number 1    Number of Visits 24    Date for PT Re-Evaluation 08/11/20    Authorization Type [employer] 30 visit limit/6 used    Authorization - Visit Number 1    Authorization - Number of Visits 24    PT Start Time 0930    PT Stop Time 1020    PT Time Calculation (min) 50 min    Activity Tolerance Patient tolerated treatment well           Past Medical History:  Diagnosis Date  . Allergy    she has seen Dr. Eileen Stanford   . Anemia    long term issues per pt  . Anxiety    due to back pain   . Asthma   . Depression   . Heart murmur   . History of stress test    ETT-echo (9/14):  ST depression noted on ECGs; no wall motion abnormalities at peak exercise on echocardiogram  . Hypertension   . VSD (ventricular septal defect)    Cardiac CTA 05/2008: Membranous VSD, 8-9 mm in diameter, mild RVE, EF 64%, calcium score 0, no CAD. Last echo 02/2011: EF 55-60%, mild CAD, perimembranous VSD slightly more prominent but no RVE or pulmonary hypertension;  Echo (9/14): Small perimembranous VSD-no significant change since 02/2011; EF 55-60%, moderate LAE    Past Surgical History:  Procedure Laterality Date  . cyst left wrist    . dental implants    . WISDOM TOOTH EXTRACTION      There were no vitals filed for this visit.    Subjective Assessment - 06/16/20 0935    Subjective Last here in December for treatment.  Doing well until January.   A lot of these issues are from high arches left > right.  Saw podiatrist for custom orthotics in February.  Developed tight hip flexors.  Tried riding bike for 20 minutes and then  could hardly get out of bed.  Went to see Apolinar Junes at [facility].  Did core  ex's like planks, side planks and squats and that threw me over the edge.  I could barely move on Friday.  I need to progress slowly.  Had to stop wearing orthotics.  Responded well to DN on left in past.  Both sides bother.    Limitations House hold activities;Walking;Sitting    How long can you sit comfortably? 5 minutes    How long can you walk comfortably? not at all.    Diagnostic tests none    Patient Stated Goals I just want to get back to where I was in January.  I know I'll always have some pain.    Currently in Pain? Yes    Pain Score 7     Pain Location Hip    Pain Orientation Left;Right;Anterior    Pain Type Chronic pain    Pain Radiating Towards left > right  anterior hip and left > right LBP    Aggravating Factors  sitting, standing, walking    Pain Relieving Factors DN in the past; heat  [facility] PT Assessment - 06/16/20 0001      Assessment   Medical Diagnosis left sided low back pain    Referring Provider (PT) Dr Philip Aspen    Onset Date/Surgical Date --   > 6 months   Next MD Visit tomorrow    Prior Therapy Fall/winter      Precautions   Precautions None      Restrictions   Weight Bearing Restrictions No      Balance Screen   Has the patient fallen in the past 6 months No    Has the patient had a decrease in activity level because of a fear of falling?  No    Is the patient reluctant to leave their home because of a fear of falling?  No      Home [employer]ist information centre manager residence      Prior Function   Level of Independence Independent with basic ADLs   I lie down mostly   Vocation Other (comment)   currently not working   Leisure TV watching; computer games      Observation/Other Assessments   Observations bil pes cavus right > left    Focus on Therapeutic Outcomes (FOTO)  48%      AROM   Lumbar Flexion 50    Lumbar Extension 10     Lumbar - Right Side Bend 32    Lumbar - Left Side Bend 28      PROM   Overall PROM Comments decreased right external rotation ROM 15 degrees      Strength   Overall Strength Comments dec trunk strength grossly 4/5    Right Hip Flexion 4-/5    Right Hip Extension 4-/5    Right Hip External Rotation  4-/5    Right Hip Internal Rotation 4-/5    Right Hip ABduction 4-/5    Left Hip Flexion 4/5    Left Hip Extension 4/5    Left Hip External Rotation 4/5    Left Hip Internal Rotation 4/5    Left Hip ABduction 4/5      Flexibility   Soft Tissue Assessment /Muscle Length yes    Hamstrings HS 80 degrees bil    Quadriceps dec bil hip flexor lengths      Palpation   Palpation comment no specific trigger points but decreased fascial/soft tissue mobility of left > right lateral quads, gluteals and thoracolumbar fascia      Ambulation/Gait   Gait Comments antalgia with dec stance time on right                      Objective measurements completed on examination: See above findings.       [facility] Adult PT Treatment/Exercise - 06/16/20 0001      Moist Heat Therapy   Number Minutes Moist Heat 5 Minutes    Moist Heat Location Hip      Manual Therapy   Soft tissue mobilization bil hip flexors, lateral quads, TFL            Trigger Point Dry Needling - 06/16/20 0001    Consent Given? Yes    Education Handout Provided Previously provided    Muscles Treated Lower Quadrant Vastus lateralis;Rectus femoris    Muscles Treated Back/Hip Tensor fascia lata    Dry Needling Comments bil    Rectus femoris Response Palpable increased muscle length    Vastus lateralis Response Palpable increased muscle length    Tensor Fascia  Lata Response Palpable increased muscle length                PT Education - 06/16/20 2100    Education Details Reminder of DN after care (pt has been needled on previous occasions)    Person(s) Educated Patient    Methods Explanation     Comprehension Verbalized understanding            PT Short Term Goals - 06/16/20 2126      PT SHORT TERM GOAL #1   Title be independent with initial HEP    Time 4    Period Weeks    Status New    Target Date 07/14/20      PT SHORT TERM GOAL #2   Title report a 30% reduction in bil hip and back pain with sitting and standing    Time 4    Period Weeks    Status New      PT SHORT TERM GOAL #3   Title Pt with improved bil hip flexibility (quads, hip flexorsand right external rotators)   to improve walking time to 10 min    Time 4    Period Weeks    Status New      PT SHORT TERM GOAL #4   Title Pt will be able to sit 10 min with pain level 4/10    Time 4    Period Weeks    Status New      PT SHORT TERM GOAL #5   Title Lumbar extension ROM to 20 degrees for improved stride length with gait             PT Long Term Goals - 06/16/20 2128      PT LONG TERM GOAL #1   Title be independent in advanced HEP    Time 8    Period Weeks    Status New    Target Date 08/11/20      PT LONG TERM GOAL #2   Title Improve FOTO score from 48% to 65%    Time 8    Period Weeks    Status New      PT LONG TERM GOAL #3   Title Pt will be able to sit for driving, play computer games, watching TV up to 20 min with min pain    Time 8    Period Weeks    Status New      PT LONG TERM GOAL #4   Title The patient will have improved hip and lumbo pelvic strength to grossly 4/5 needed for standing/walking longer periods of time    Time 8    Period Weeks    Status New      PT LONG TERM GOAL #5   Title The patient will be able to walk 15-20 min with min pain    Time 8    Period Weeks    Status New      Additional Long Term Goals   Additional Long Term Goals Yes                  Plan - 06/16/20 2102    Clinical Impression Statement The patient is well known to this facility for previous courses of treatment for LBP and hip pain which included therapeutic exercise, manual  therapy and dry needling.   She completed her last series in December.  She states she was doing well until February when she started wearing custom orthotics which seemed  to cause cause a flare up of her proximal symptoms.  She also attributes the exacerbation to riding a bike.  She attended PT at another facility until last week with focus on core strengthening which she felt was too much/too soon.  She reports poor tolerance to sitting, standing and walking.    Her lumbar ROM has mild movement loss in all planes but especially limited in extension. Decreased bil hip extension and dec right hip external rotation passive ROM.  Right hip weakness grossly 4-/5, left 4/5. Lumbo/pelvic strength  4/5.  FOTO score is 48% indicating moderate difficulty with ADLs.  She would benefit from PT to address these deficits.    Personal Factors and Comorbidities Comorbidity 1;Comorbidity 2;Comorbidity 3+    Comorbidities chronic history; multi regions affected;  Depression; Asthma; HTN    Examination-Activity Limitations Locomotion Level;Sit;Carry;Squat;Lift;Stand;Stairs    Examination-Participation Restrictions Meal Prep;Cleaning;Occupation;Shop;Laundry;Community Activity    Stability/Clinical Decision Making Evolving/Moderate complexity    Clinical Decision Making Moderate    Rehab Potential Good    PT Frequency 2x / week    PT Duration 8 weeks    PT Treatment/Interventions ADLs/Self Care Home Management;Aquatic Therapy;Cryotherapy;Electrical Stimulation;Ultrasound;Traction;Moist Heat;Iontophoresis 4mg /ml Dexamethasone;Therapeutic activities;Therapeutic exercise;Neuromuscular re-education;Manual techniques;Patient/family education;Dry needling;Spinal Manipulations;Taping    PT Next Visit Plan assess response to Dn to bil hip flexors; add ES to DN; DN to gluteals and/or lumbar muscles; passive hip flexor stretching; manual therapy; 2nd step stretch for hip flexors           Patient will benefit from skilled  therapeutic intervention in order to improve the following deficits and impairments:  Decreased range of motion,Difficulty walking,Increased fascial restricitons,Impaired UE functional use,Pain,Decreased activity tolerance,Impaired flexibility,Decreased strength  Visit Diagnosis: Muscle weakness (generalized) - Plan: PT plan of care cert/re-cert  Acute bilateral low back pain without sciatica - Plan: PT plan of care cert/re-cert  Cramp and spasm - Plan: PT plan of care cert/re-cert     Problem List Patient Active Problem List   Diagnosis Date Noted  . Vitamin D deficiency 12/03/2019  . Hyperlipidemia 12/03/2019  . 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 Hypertension 05/03/2016  . Low back pain 06/16/2014  . Anemia due to other cause 03/24/2014  . Routine general medical examination at a health care facility 03/24/2014  . VSD (ventricular septal defect) 03/09/2011  . URTICARIA 06/03/2008  . SEBACEOUS CYST 10/09/2007  . Asthma 10/31/2006   11/02/2006, PT 06/16/20 9:35 PM Phone: [phone] Fax: [phone] 06/16/2020, 9:34 PM  [facility] [address], [address] [place], [place], [place] Phone: [phone]   Fax:  [phone]  Name: Jovana Rembold MRN: [name] Date of Birth: 12/17/1970

## 2020-06-17 ENCOUNTER — Ambulatory Visit (INDEPENDENT_AMBULATORY_CARE_PROVIDER_SITE_OTHER): Payer: BC Managed Care – PPO | Admitting: Internal Medicine

## 2020-06-17 ENCOUNTER — Encounter: Payer: Self-pay | Admitting: Internal Medicine

## 2020-06-17 VITALS — BP 130/80 | HR 77 | Temp 98.2°F | Wt 154.2 lb

## 2020-06-17 DIAGNOSIS — F339 Major depressive disorder, recurrent, unspecified: Secondary | ICD-10-CM

## 2020-06-17 MED ORDER — SERTRALINE HCL 50 MG PO TABS: 50.0000 mg | ORAL_TABLET | Freq: Every day | ORAL | 1 refills | Status: DC

## 2020-06-17 NOTE — Progress Notes (Addendum)
Established Patient Office Visit     This visit occurred during the SARS-CoV-2 public health emergency.  Safety protocols were in place, including screening questions prior to the visit, additional usage of staff PPE, and extensive cleaning of exam room while observing appropriate contact time as indicated for disinfecting solutions.    CC/Reason for Visit: Discuss depression  HPI: Elizabeth Lozano is a 50 y.o. female who is coming in today for the above mentioned reasons.  She has been having significant issues with depression.  She has scored a 3 on almost every item on PHQ-9 screening today.  At one point she had been prescribed Zoloft but she never took it.  She has been having a lot of muscle pain issues that are unresolved by physical therapy.  Her dog died 3 weeks ago.  She feels extremely emotional at times, "sometimes I think the world would be better without me".  She has no active suicide plan.  She is very tearful today.  Past Medical/Surgical History: Past Medical History:  Diagnosis Date  . Allergy    she has seen Dr. Eileen Stanford   . Anemia    long term issues per pt  . Anxiety    due to back pain   . Asthma   . Depression   . Heart murmur   . History of stress test    ETT-echo (9/14):  ST depression noted on ECGs; no wall motion abnormalities at peak exercise on echocardiogram  . Hypertension   . VSD (ventricular septal defect)    Cardiac CTA 05/2008: Membranous VSD, 8-9 mm in diameter, mild RVE, EF 64%, calcium score 0, no CAD. Last echo 02/2011: EF 55-60%, mild CAD, perimembranous VSD slightly more prominent but no RVE or pulmonary hypertension;  Echo (9/14): Small perimembranous VSD-no significant change since 02/2011; EF 55-60%, moderate LAE    Past Surgical History:  Procedure Laterality Date  . cyst left wrist    . dental implants    . WISDOM TOOTH EXTRACTION      Social History:  reports that she has never smoked. She has never used smokeless tobacco. She  reports current alcohol use of about 4.0 standard drinks of alcohol per week. She reports that she does not use drugs.  Allergies: Allergies  Allergen Reactions  . Erythromycin Nausea And Vomiting  . Iohexol Hives     Code: HIVES, Desc: RN AMY NOTICED HIVE ON STOMACH AFTER HEART SCAN. ALSO ONE ON BACK. NOT ITCHING UNTIL AFTER SHE KNEW OF HIVES. DR.NISHAN NOTIFIED. GIVEN BENADRYL., Onset Date: [id number]   . Tetracycline Hives    Family History:  Family History  Problem Relation Age of Onset  . Sarcoidosis Mother   . Colon polyps Mother   . Heart attack Father   . Hypertension Father   . Hyperlipidemia Father   . Melanoma Father   . Cancer Father   . Healthy Brother   . Colon cancer Paternal Grandmother   . Crohn's disease Neg Hx   . Esophageal cancer Neg Hx   . Rectal cancer Neg Hx   . Stomach cancer Neg Hx      Current Outpatient Medications:  .  cetirizine (ZYRTEC) 10 MG tablet, Take 10 mg by mouth daily., Disp: , Rfl:  .  MAGNESIUM PO, Take 200 mg by mouth daily. , Disp: , Rfl:  .  Multiple Vitamin (MULTIVITAMIN) tablet, Take 1 tablet by mouth daily. Centrum Mini's for Women 50 Plus, Disp: , Rfl:  .  norethindrone (MICRONOR) 0.35 MG tablet, Take 1 tablet by mouth daily., Disp: , Rfl:  .  sertraline (ZOLOFT) 50 MG tablet, Take 1 tablet (50 mg total) by mouth daily., Disp: 90 tablet, Rfl: 1  Review of Systems:  Constitutional: Denies fever, chills, diaphoresis, appetite change and fatigue.  HEENT: Denies photophobia, eye pain, redness, hearing loss, ear pain, congestion, sore throat, rhinorrhea, sneezing, mouth sores, trouble swallowing, neck pain, neck stiffness and tinnitus.   Respiratory: Denies SOB, DOE, cough, chest tightness,  and wheezing.   Cardiovascular: Denies chest pain, palpitations and leg swelling.  Gastrointestinal: Denies nausea, vomiting, abdominal pain, diarrhea, constipation, blood in stool and abdominal distention.  Genitourinary: Denies dysuria,  urgency, frequency, hematuria, flank pain and difficulty urinating.  Endocrine: Denies: hot or cold intolerance, sweats, changes in hair or nails, polyuria, polydipsia. Musculoskeletal: Denies  joint swelling. Skin: Denies pallor, rash and wound.  Neurological: Denies dizziness, seizures, syncope, weakness, light-headedness, numbness and headaches.  Hematological: Denies adenopathy. Easy bruising, personal or family bleeding history  Psychiatric/Behavioral: Denies suicidal ideation,  Confusion.    Physical Exam: Vitals:   06/17/20 1459  BP: 130/80  Pulse: 77  Temp: 98.2 F (36.8 C)  TempSrc: Oral  SpO2: 98%  Weight: 154 lb 3.2 oz (69.9 kg)    Body mass index is 24.89 kg/m.   Constitutional: NAD, calm, comfortable, tearful Eyes: PERRL, lids and conjunctivae normal ENMT: Mucous membranes are moist.  Neurologic: Grossly intact and nonfocal Psychiatric: Normal judgment and insight. Alert and oriented x 3. Normal mood.   Impression and Plan:  Depression, recurrent (HCC)  -She has scored quite highly on depression screening today. -I suspect some of her muscular pain issues might be related to severe depression. -She will start Zoloft 50 mg daily. -She had done CBT sessions in the past, have advised that she resume as soon as possible. -She will return in 8 weeks for follow-up.  Time spent: 30 minutes reviewing chart, interviewing patient, obtaining history, formulating plan of care.    Patient Instructions  -Nice seeing you today!!  -Start Zoloft 50 mg at bedtime.  -Make sure you schedule counseling sessions.  -Schedule follow up with me in 8 weeks.     Chaya Jan, MD [facility]

## 2020-06-17 NOTE — Patient Instructions (Signed)
-  Nice seeing you today!!  -Start Zoloft 50 mg at bedtime.  -Make sure you schedule counseling sessions.  -Schedule follow up with me in 8 weeks.

## 2020-06-18 ENCOUNTER — Other Ambulatory Visit: Payer: Self-pay

## 2020-06-18 ENCOUNTER — Ambulatory Visit: Payer: BC Managed Care – PPO | Admitting: Physical Therapy

## 2020-06-18 ENCOUNTER — Encounter: Payer: Self-pay | Admitting: Internal Medicine

## 2020-06-18 DIAGNOSIS — M6281 Muscle weakness (generalized): Secondary | ICD-10-CM | POA: Diagnosis not present

## 2020-06-18 DIAGNOSIS — R252 Cramp and spasm: Secondary | ICD-10-CM

## 2020-06-18 DIAGNOSIS — M545 Low back pain, unspecified: Secondary | ICD-10-CM

## 2020-06-18 NOTE — Therapy (Signed)
[facility] [address] [place], [place], [address] Phone: [phone]   Fax:  [phone]  Physical Therapy Treatment  Patient Details  Name: Elizabeth Lozano MRN: [id number] Date of Birth: 1970-06-07 Referring Provider (PT): Dr Philip Aspen   Encounter Date: 06/18/2020   PT End of Session - 06/18/20 2011    Visit Number 2    Number of Visits 24    Date for PT Re-Evaluation 08/11/20    Authorization Type BC[employer] 30 visit limit/6 used    Authorization - Visit Number 2    Authorization - Number of Visits 24    PT Start Time 1100    PT Stop Time 1145    PT Time Calculation (min) 45 min    Activity Tolerance Patient tolerated treatment well           Past Medical History:  Diagnosis Date  . Allergy    she has seen Dr. Eileen Stanford   . Anemia    long term issues per pt  . Anxiety    due to back pain   . Asthma   . Depression   . Heart murmur   . History of stress test    ETT-echo (9/14):  ST depression noted on ECGs; no wall motion abnormalities at peak exercise on echocardiogram  . Hypertension   . VSD (ventricular septal defect)    Cardiac CTA 05/2008: Membranous VSD, 8-9 mm in diameter, mild RVE, EF 64%, calcium score 0, no CAD. Last echo 02/2011: EF 55-60%, mild CAD, perimembranous VSD slightly more prominent but no RVE or pulmonary hypertension;  Echo (9/14): Small perimembranous VSD-no significant change since 02/2011; EF 55-60%, moderate LAE    Past Surgical History:  Procedure Laterality Date  . cyst left wrist    . dental implants    . WISDOM TOOTH EXTRACTION      There were no vitals filed for this visit.   Subjective Assessment - 06/18/20 1103    Subjective My QL is going crazy and that pulls on my neck/shoulder.  Points to lumbar paraspinals.  Yesterday my glutes were crazy but OK today.  The first day after the DN was better but not complete relief.  Worse on the left side.    Patient Stated Goals I  just want to get back to where I was in January.  I know I'll always have some pain.    Currently in Pain? Yes    Pain Score 6     Pain Location Back    Pain Orientation Right;Left;Lower    Pain Type Chronic pain    Pain Radiating Towards left > right gluteals    Multiple Pain Sites Yes                             [facility] Adult PT Treatment/Exercise - 06/18/20 0001      Lumbar Exercises: Stretches   Other Lumbar Stretch Exercise doorway dynamic stretch:staggered stance 5x, single arm slides on doorframe 5x; arm slides up and overhead 5x right and left      Insurance claims handler Stimulation Location bil lumbar multifidi    Electrical Stimulation Action with DN    Electrical Stimulation Parameters 1.5 V 8 min    Electrical Stimulation Goals Pain      Manual Therapy   Joint Mobilization pelvic distraction and neutral gapping grade 3 3x 30 sec right/left    Soft  tissue mobilization bil lumbar paraspinals, bil QL, bil gluteals and piriformis            Trigger Point Dry Needling - 06/18/20 0001    Consent Given? Yes    Muscles Treated Back/Hip Gluteus minimus;Gluteus medius;Gluteus maximus;Piriformis;Lumbar multifidi;Quadratus lumborum    Dry Needling Comments bil    Electrical Stimulation Performed with Dry Needling Yes    E-stim with Dry Needling Details bil multifidi    Gluteus Minimus Response Palpable increased muscle length    Gluteus Medius Response Palpable increased muscle length    Gluteus Maximus Response Palpable increased muscle length    Piriformis Response Palpable increased muscle length    Lumbar multifidi Response Palpable increased muscle length    Quadratus Lumborum Response Palpable increased muscle length                PT Education - 06/18/20 2011    Education Details doorway dynamic stretch    Person(s) Educated Patient    Methods Explanation;Demonstration;Handout    Comprehension Returned demonstration;Verbalized  understanding            PT Short Term Goals - 06/16/20 2126      PT SHORT TERM GOAL #1   Title be independent with initial HEP    Time 4    Period Weeks    Status New    Target Date 07/14/20      PT SHORT TERM GOAL #2   Title report a 30% reduction in bil hip and back pain with sitting and standing    Time 4    Period Weeks    Status New      PT SHORT TERM GOAL #3   Title Pt with improved bil hip flexibility (quads, hip flexorsand right external rotators)   to improve walking time to 10 min    Time 4    Period Weeks    Status New      PT SHORT TERM GOAL #4   Title Pt will be able to sit 10 min with pain level 4/10    Time 4    Period Weeks    Status New      PT SHORT TERM GOAL #5   Title Lumbar extension ROM to 20 degrees for improved stride length with gait             PT Long Term Goals - 06/16/20 2128      PT LONG TERM GOAL #1   Title be independent in advanced HEP    Time 8    Period Weeks    Status New    Target Date 08/11/20      PT LONG TERM GOAL #2   Title Improve FOTO score from 48% to 65%    Time 8    Period Weeks    Status New      PT LONG TERM GOAL #3   Title Pt will be able to sit for driving, play computer games, watching TV up to 20 min with min pain    Time 8    Period Weeks    Status New      PT LONG TERM GOAL #4   Title The patient will have improved hip and lumbo pelvic strength to grossly 4/5 needed for standing/walking longer periods of time    Time 8    Period Weeks    Status New      PT LONG TERM GOAL #5   Title The patient will be able  to walk 15-20 min with min pain    Time 8    Period Weeks    Status New      Additional Long Term Goals   Additional Long Term Goals Yes                 Plan - 06/18/20 2012    Clinical Impression Statement Treatment focus on addressing pain and soft tissue mobility on lumbar myofascia and gluteals.  Good response to DN, manual therapy and the addition of ES with DN to  lumbar multifidi.   She is able to initiate psoas and QL dynamic lengthening ex's in standing without exacerbation of pain.  Improve hip extension noted with this exercise compared to initial visit 2 days ago.  Therapist monitoring response with all treatment interventions.    Comorbidities chronic history; multi regions affected;  Depression; Asthma; HTN    Examination-Activity Limitations Locomotion Level;Sit;Carry;Squat;Lift;Stand;Stairs    Examination-Participation Restrictions Meal Prep;Cleaning;Occupation;Shop;Laundry;Community Activity    Stability/Clinical Decision Making Evolving/Moderate complexity    Rehab Potential Good    PT Frequency 2x / week    PT Duration 8 weeks    PT Treatment/Interventions ADLs/Self Care Home Management;Aquatic Therapy;Cryotherapy;Electrical Stimulation;Ultrasound;Traction;Moist Heat;Iontophoresis 4mg /ml Dexamethasone;Therapeutic activities;Therapeutic exercise;Neuromuscular re-education;Manual techniques;Patient/family education;Dry needling;Spinal Manipulations;Taping    PT Next Visit Plan assess response to DN/ES to multifidi and DN to QL and gluteals;  progress ex as tolerated; possible Elliptical; glute strengthening; manual therapy           Patient will benefit from skilled therapeutic intervention in order to improve the following deficits and impairments:  Decreased range of motion,Difficulty walking,Increased fascial restricitons,Impaired UE functional use,Pain,Decreased activity tolerance,Impaired flexibility,Decreased strength  Visit Diagnosis: Muscle weakness (generalized)  Acute bilateral low back pain without sciatica  Cramp and spasm     Problem List Patient Active Problem List   Diagnosis Date Noted  . Vitamin D deficiency 12/03/2019  . Hyperlipidemia 12/03/2019  . 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 Hypertension 05/03/2016  . Low back pain 06/16/2014  . Anemia due to other cause 03/24/2014  . Routine general medical  examination at a health care facility 03/24/2014  . VSD (ventricular septal defect) 03/09/2011  . URTICARIA 06/03/2008  . SEBACEOUS CYST 10/09/2007  . Asthma 10/31/2006   11/02/2006, PT 06/18/20 8:20 PM Phone: [phone] Fax: [phone] 06/18/2020, 8:20 PM  [facility] [address] [place], [place], [place] Phone: [phone]   Fax:  [phone]  Name: Elizabeth Lozano MRN: [name] Date of Birth: December 20, 1970

## 2020-06-18 NOTE — Patient Instructions (Signed)
Access Code: [facility] URL: [email or web address] Date: 06/18/2020 Prepared by: Lavinia Sharps  Exercises Standing Hip Flexor Stretch - 1 x daily - 7 x weekly - 1 sets - 3 reps - 10 hold Side Lunge Adductor Stretch - 1 x daily - 7 x weekly - 1 sets - 3 reps - 10 hold Standing Quadriceps Stretch - 1 x daily - 7 x weekly - 1 sets - 3 reps - 10 hold Seated Figure 4 Piriformis Stretch - 1 x daily - 7 x weekly - 3 sets - 2 reps - 30 hold Supine Figure 4 Piriformis Stretch - 1 x daily - 7 x weekly - 3 sets - 2 reps - 30 hold Sidelying Open Book Thoracic Rotation with Knee on Foam Roll - 2 x daily - 7 x weekly - 1 sets - 10 reps Prone Press Up - 2 x daily - 7 x weekly - 1 sets - 10 reps Pigeon Pose - 1 x daily - 7 x weekly - 3 sets - 10 reps [facility] with Gluteal Activation - 1 x daily - 7 x weekly - 3 sets - 10 reps Supine Bilateral Isometric Hip Flexion - 1 x daily - 7 x weekly - 3 sets - 10 reps Quadruped Adductor Stretch - 1 x daily - 7 x weekly - 1 sets - 3 reps - 20-30 hold Single Arm Doorway Pec Stretch at 120 Degrees Abduction - 1 x daily - 7 x weekly - 3 sets - 5 reps

## 2020-06-19 ENCOUNTER — Encounter: Payer: Self-pay | Admitting: Internal Medicine

## 2020-06-19 NOTE — Telephone Encounter (Signed)
Patient has been taking medication for 2 days and wants to stop taking the medication. She wants to  know if she can just stop taking them because of the side effects or does she need to taper off from them.

## 2020-06-23 ENCOUNTER — Ambulatory Visit: Payer: BC Managed Care – PPO | Admitting: Physical Therapy

## 2020-06-23 ENCOUNTER — Encounter: Payer: Self-pay | Admitting: Physical Therapy

## 2020-06-23 ENCOUNTER — Other Ambulatory Visit: Payer: Self-pay

## 2020-06-23 DIAGNOSIS — M545 Low back pain, unspecified: Secondary | ICD-10-CM

## 2020-06-23 DIAGNOSIS — R252 Cramp and spasm: Secondary | ICD-10-CM | POA: Diagnosis not present

## 2020-06-23 DIAGNOSIS — M6281 Muscle weakness (generalized): Secondary | ICD-10-CM | POA: Diagnosis not present

## 2020-06-23 NOTE — Therapy (Signed)
[facility] [address] [place], [place], [address] Phone: [phone]   Fax:  [phone]  Physical Therapy Treatment  Patient Details  Name: Elizabeth Lozano MRN: [id number] Date of Birth: 01/06/1971 Referring Provider (PT): Dr Philip AspenHernandez Acosta   Encounter Date: 06/23/2020   PT End of Session - 06/23/20 1009    Visit Number 3    Number of Visits 24    Date for PT Re-Evaluation 08/11/20    Authorization Type [employer] 30 visit limit/6 used    Authorization - Visit Number 3    Authorization - Number of Visits 24    PT Start Time 1010    PT Stop Time 1100    PT Time Calculation (min) 50 min    Activity Tolerance Patient tolerated treatment well    Behavior [facility] Therapy [facility] for tasks assessed/performed           Past Medical History:  Diagnosis Date  . Allergy    she has seen Dr. Eileen StanfordMeg Whelan   . Anemia    long term issues per pt  . Anxiety    due to back pain   . Asthma   . Depression   . Heart murmur   . History of stress test    ETT-echo (9/14):  ST depression noted on ECGs; no wall motion abnormalities at peak exercise on echocardiogram  . Hypertension   . VSD (ventricular septal defect)    Cardiac CTA 05/2008: Membranous VSD, 8-9 mm in diameter, mild RVE, EF 64%, calcium score 0, no CAD. Last echo 02/2011: EF 55-60%, mild CAD, perimembranous VSD slightly more prominent but no RVE or pulmonary hypertension;  Echo (9/14): Small perimembranous VSD-no significant change since 02/2011; EF 55-60%, moderate LAE    Past Surgical History:  Procedure Laterality Date  . cyst left wrist    . dental implants    . WISDOM TOOTH EXTRACTION      There were no vitals filed for this visit.   Subjective Assessment - 06/23/20 1010    Subjective I was getting some good stretches in with the new stretches and the next day I did the elliptical x 6' and it was kind of a disaster.  I felt my whole core tighten up.  I am trying to  increase my sitting tolerance but haven't sat today b/c it makes things worse.  Overall I do think I'm about 20% better so far with this round of PT.    Limitations House hold activities;Walking;Sitting    How long can you sit comfortably? 5 minutes    How long can you walk comfortably? not at all.    Diagnostic tests none    Patient Stated Goals I just want to get back to where I was in January.  I know I'll always have some pain.    Currently in Pain? Yes    Pain Score 2     Pain Location Back    Pain Orientation Right;Left;Lower    Pain Descriptors / Indicators Tightness    Pain Type Chronic pain    Pain Radiating Towards left>right anterior hips and into core    Pain Onset More than a month ago    Pain Frequency Constant    Aggravating Factors  sitting>standing and walking    Pain Relieving Factors DN, heat              [facility] PT Assessment - 06/23/20 0001      Flexibility  Quadriceps dec bil hip flexor length Lt>Rt      Palpation   Palpation comment Lt QL TP present, Lt glut med TP present and bil diaphram tension present, Lt lower thoracic paraspinals tight but non-tender                         [facility] Adult PT Treatment/Exercise - 06/23/20 0001      Self-Care   Self-Care Other Self-Care Comments    Other Self-Care Comments  HEP and reasoning for/instruction of breathwork for ribcage expansion, diaphram excursion, and use for stress/anxiety      Neuro Re-ed    Neuro Re-ed Details  diaphragmatic breathing supine laying flat, PT VC and TC for abdominal breathing and lateral costal expansion 10 reps with each hand placement (hand on abdomen and chest x 10, then hands on lateral ribcage x 10), inhale through nose x 7 sec, hold 3 sec, exhale as an open mouth sigh x 5 sec      Lumbar Exercises: Stretches   Hip Flexor Stretch Left;Right;30 seconds    Hip Flexor Stretch Limitations in doorway with arms in field goal position, add dynamic heel raises with stretch     Quad Stretch Left;Right;1 rep;30 seconds    Quad Stretch Limitations standing    Other Lumbar Stretch Exercise open book x 5 each side, inhale/exhale at end range      Manual Therapy   Manual Therapy Passive ROM    Passive ROM Lt hip flexor stretch in SL with contract/relax x 3 rounds            Trigger Point Dry Needling - 06/23/20 0001    Consent Given? Yes    Education Handout Provided Previously provided    Dry Needling Comments Lt only    Gluteus Medius Response Twitch response elicited;Palpable increased muscle length    Quadratus Lumborum Response Twitch response elicited;Palpable increased muscle length                PT Education - 06/23/20 1101    Education Details added open book, added heel raises to doorway hip flexor stretch, added supine breathwork for ribcage expansion and diaphram release/excursion    Person(s) Educated Patient    Methods Explanation;Demonstration;Handout    Comprehension Verbalized understanding;Returned demonstration            PT Short Term Goals - 06/16/20 2126      PT SHORT TERM GOAL #1   Title be independent with initial HEP    Time 4    Period Weeks    Status New    Target Date 07/14/20      PT SHORT TERM GOAL #2   Title report a 30% reduction in bil hip and back pain with sitting and standing    Time 4    Period Weeks    Status New      PT SHORT TERM GOAL #3   Title Pt with improved bil hip flexibility (quads, hip flexorsand right external rotators)   to improve walking time to 10 min    Time 4    Period Weeks    Status New      PT SHORT TERM GOAL #4   Title Pt will be able to sit 10 min with pain level 4/10    Time 4    Period Weeks    Status New      PT SHORT TERM GOAL #5   Title Lumbar extension ROM  to 20 degrees for improved stride length with gait             PT Long Term Goals - 06/16/20 2128      PT LONG TERM GOAL #1   Title be independent in advanced HEP    Time 8    Period Weeks     Status New    Target Date 08/11/20      PT LONG TERM GOAL #2   Title Improve FOTO score from 48% to 65%    Time 8    Period Weeks    Status New      PT LONG TERM GOAL #3   Title Pt will be able to sit for driving, play computer games, watching TV up to 20 min with min pain    Time 8    Period Weeks    Status New      PT LONG TERM GOAL #4   Title The patient will have improved hip and lumbo pelvic strength to grossly 4/5 needed for standing/walking longer periods of time    Time 8    Period Weeks    Status New      PT LONG TERM GOAL #5   Title The patient will be able to walk 15-20 min with min pain    Time 8    Period Weeks    Status New      Additional Long Term Goals   Additional Long Term Goals Yes                 Plan - 06/23/20 1315    Clinical Impression Statement Pt arrived with report of 2/10 pain.  PT noted signif improvement in soft tissue tension and palpable trigger points compared to previous visits.  She did have restriction and tenderness in Lt QL and Lt glut med which had good twitch/release today with DN.  Pt had trialed 6 min on elliptical and reported "core disaster" which was a feeling of tightness in abdominal wall that limited movement and ability to be fully upright.  PT noted ongoing tightness in bil hip flexors for which Pt is stretching successfully at home.  PT noted lower ribcage restrictions and diaphram restrictions Rt>Lt so instructed Pt on breathwork to improve excursion and mobility of lower ribcage soft tissues and joints.  PT also educated Pt on how breathwork such as what was added to HEP can help with stress and anxiety that can feed into pain experience.  PT discussed trying sequence of HEP from breathwork to open books to LE stretches to help optimize mobility and "clear the slate" of guarding and tension with goal of building some proper core activation strength over next few visits.    Comorbidities chronic history; multi regions  affected;  Depression; Asthma; HTN    PT Frequency 2x / week    PT Duration 8 weeks    PT Treatment/Interventions ADLs/Self Care Home Management;Aquatic Therapy;Cryotherapy;Electrical Stimulation;Ultrasound;Traction;Moist Heat;Iontophoresis 4mg /ml Dexamethasone;Therapeutic activities;Therapeutic exercise;Neuromuscular re-education;Manual techniques;Patient/family education;Dry needling;Spinal Manipulations;Taping    PT Next Visit Plan DN as needed, review breathwork (diaphragmatic breathing and lateral costal expansion), open books, LE stretches, begin light core activation, try pilates articulating bridge    PT Home Exercise Plan see Pt instructions (breathwork, open books, hip flexor, quad and hamstring stretches from previous HEP)    Consulted and Agree with Plan of Care Patient           Patient will benefit from skilled therapeutic intervention in order to  improve the following deficits and impairments:     Visit Diagnosis: Muscle weakness (generalized)  Acute bilateral low back pain without sciatica  Cramp and spasm     Problem List Patient Active Problem List   Diagnosis Date Noted  . Vitamin D deficiency 12/03/2019  . Hyperlipidemia 12/03/2019  . 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 Hypertension 05/03/2016  . Low back pain 06/16/2014  . Anemia due to other cause 03/24/2014  . Routine general medical examination at a health care facility 03/24/2014  . VSD (ventricular septal defect) 03/09/2011  . URTICARIA 06/03/2008  . SEBACEOUS CYST 10/09/2007  . Asthma 10/31/2006    Morton Peters, PT 06/23/20 1:25 PM    [facility] [address], [address] [place], [place], [address] Phone: [phone]   Fax:  [phone]  Name: Elizabeth Lozano MRN: [id number] Date of Birth: 01-May-1970

## 2020-06-23 NOTE — Patient Instructions (Addendum)
Ribcage expansion breathing and diaphragm release through breathwork:  Lay flat on your back Place hand on abdomen and on chest Belly breathe in THROUGH NOSE x 7 sec, hold 3 sec, exhale passively THROUGH MOUTH (SIGH) over 5 sec. Do this 10 times, taking your time between breaths. Then place hands on outside of ribcage on each side.  Breathe into side walls of your ribcage, same timing and pattern (7 in, hold 3, release 5, inhale through nose, exhale through mouth). Do this for 10 rounds.   THEN OPEN BOOKS X 5 ROUNDS EACH SIDE, BREATHE INTO LOW BACK AT END RANGE AND EXHALE AND RELAX, THEN REPEAT THEN STRETCHES FOR LEGS AND TRUNK

## 2020-06-25 ENCOUNTER — Ambulatory Visit: Payer: BC Managed Care – PPO | Admitting: Physical Therapy

## 2020-06-25 ENCOUNTER — Other Ambulatory Visit: Payer: Self-pay

## 2020-06-25 ENCOUNTER — Encounter: Payer: Self-pay | Admitting: Physical Therapy

## 2020-06-25 DIAGNOSIS — R252 Cramp and spasm: Secondary | ICD-10-CM

## 2020-06-25 DIAGNOSIS — M6281 Muscle weakness (generalized): Secondary | ICD-10-CM | POA: Diagnosis not present

## 2020-06-25 DIAGNOSIS — M545 Low back pain, unspecified: Secondary | ICD-10-CM | POA: Diagnosis not present

## 2020-06-25 NOTE — Therapy (Signed)
[facility] [address], [address] [place], [place], [address] Phone: [phone]   Fax:  [phone]  Physical Therapy Treatment  Patient Details  Name: Elizabeth Lozano MRN: [id number] Date of Birth: [birth year] Referring Provider (PT): Dr Philip Aspen   Encounter Date: 06/25/2020   PT End of Session - 06/25/20 1252    Visit Number 4    Number of Visits 24    Date for PT Re-Evaluation 08/11/20    Authorization Type BC[employer] 30 visit limit/6 used    Authorization - Visit Number 4    Authorization - Number of Visits 24    PT Start Time 1101    PT Stop Time 1145    PT Time Calculation (min) 44 min    Activity Tolerance Patient tolerated treatment well;No increased pain    Behavior During Therapy WFL for tasks assessed/performed           Past Medical History:  Diagnosis Date  . Allergy    she has seen Dr. Eileen Stanford   . Anemia    long term issues per pt  . Anxiety    due to back pain   . Asthma   . Depression   . Heart murmur   . History of stress test    ETT-echo (9/14):  ST depression noted on ECGs; no wall motion abnormalities at peak exercise on echocardiogram  . Hypertension   . VSD (ventricular septal defect)    Cardiac CTA 05/2008: Membranous VSD, 8-9 mm in diameter, mild RVE, EF 64%, calcium score 0, no CAD. Last echo 02/2011: EF 55-60%, mild CAD, perimembranous VSD slightly more prominent but no RVE or pulmonary hypertension;  Echo (9/14): Small perimembranous VSD-no significant change since 02/2011; EF 55-60%, moderate LAE    Past Surgical History:  Procedure Laterality Date  . cyst left wrist    . dental implants    . WISDOM TOOTH EXTRACTION      There were no vitals filed for this visit.   Subjective Assessment - 06/25/20 1106    Subjective Pt states that she felt good after her last session but she did more activity than usual and this has caused things to "tighten up". She is very tight today. There is  some pain in the Lt glute med. Her quads are also sore.    Limitations House hold activities;Walking;Sitting    How long can you sit comfortably? 5 minutes    How long can you walk comfortably? not at all.    Diagnostic tests none    Patient Stated Goals I just want to get back to where I was in January.  I know I'll always have some pain.    Currently in Pain? Yes    Pain Score 4     Pain Location Hip    Pain Orientation Left;Lateral    Pain Descriptors / Indicators Aching    Pain Radiating Towards none noted    Pain Onset More than a month ago                             [facility] Adult PT Treatment/Exercise - 06/25/20 0001      Exercises   Exercises Knee/Hip      Knee/Hip Exercises: Stretches   Quad Stretch 1 rep;Both;10 seconds    Quad Stretch Limitations off edge of table: pt did not want to repeat on the Lt    Other Knee/Hip Stretches  thoracic open book stretch 5x5 sec hold each direction    Other Knee/Hip Stretches hooklying with knees 90/90 hip IR/ER x10 reps      Manual Therapy   Manual therapy comments skilled palpation during dry needling    Joint Mobilization Lt hip inferior/lateral mobilization 3x20 sec grade III-IV    Soft tissue mobilization Lt TFL, Lt quadriceps            Trigger Point Dry Needling - 06/25/20 0001    Consent Given? Yes    Education Handout Provided Previously provided    Vastus lateralis Response Twitch response elicited;Palpable increased muscle length   Lt               PT Education - 06/25/20 1251    Education Details HEP updates; importance of grading activity exposure on days she is feeling good    Person(s) Educated Patient    Methods Explanation;Tactile cues;Verbal cues    Comprehension Verbalized understanding;Returned demonstration            PT Short Term Goals - 06/16/20 2126      PT SHORT TERM GOAL #1   Title be independent with initial HEP    Time 4    Period Weeks    Status New    Target  Date 07/14/20      PT SHORT TERM GOAL #2   Title report a 30% reduction in bil hip and back pain with sitting and standing    Time 4    Period Weeks    Status New      PT SHORT TERM GOAL #3   Title Pt with improved bil hip flexibility (quads, hip flexorsand right external rotators)   to improve walking time to 10 min    Time 4    Period Weeks    Status New      PT SHORT TERM GOAL #4   Title Pt will be able to sit 10 min with pain level 4/10    Time 4    Period Weeks    Status New      PT SHORT TERM GOAL #5   Title Lumbar extension ROM to 20 degrees for improved stride length with gait             PT Long Term Goals - 06/16/20 2128      PT LONG TERM GOAL #1   Title be independent in advanced HEP    Time 8    Period Weeks    Status New    Target Date 08/11/20      PT LONG TERM GOAL #2   Title Improve FOTO score from 48% to 65%    Time 8    Period Weeks    Status New      PT LONG TERM GOAL #3   Title Pt will be able to sit for driving, play computer games, watching TV up to 20 min with min pain    Time 8    Period Weeks    Status New      PT LONG TERM GOAL #4   Title The patient will have improved hip and lumbo pelvic strength to grossly 4/5 needed for standing/walking longer periods of time    Time 8    Period Weeks    Status New      PT LONG TERM GOAL #5   Title The patient will be able to walk 15-20 min with min pain    Time 8  Period Weeks    Status New      Additional Long Term Goals   Additional Long Term Goals Yes                 Plan - 06/25/20 1257    Clinical Impression Statement Pt reports 4/10 pain in her Lt glute med upon arrival. She had a positive response to hear PT session earlier this week, but her tightness and pain were exacerbated after she did more activity than she usually does. Pt did well with a majority of today's stretches, but required modification to thomas stretch for the quads/hip flexors secondary to low back  pain. Pt has had issues with back discomfrot during bridging, but she was willing to try this today. PT encouraged completing through a partial range and working to full hip extension as she is able. Ended the session with dry needling and soft tissue mobilization. Pt was walking with improved hip extension and decreased pain following today's session.    Comorbidities chronic history; multi regions affected;  Depression; Asthma; HTN    PT Frequency 2x / week    PT Duration 8 weeks    PT Treatment/Interventions ADLs/Self Care Home Management;Aquatic Therapy;Cryotherapy;Electrical Stimulation;Ultrasound;Traction;Moist Heat;Iontophoresis 4mg /ml Dexamethasone;Therapeutic activities;Therapeutic exercise;Neuromuscular re-education;Manual techniques;Patient/family education;Dry needling;Spinal Manipulations;Taping    PT Next Visit Plan DN as needed, open books, LE stretches, begin light core activation, progress bridge    PT Home Exercise Plan see Pt instructions (breathwork, open books, hip flexor, quad and hamstring stretches from previous HEP)    Consulted and Agree with Plan of Care Patient           Patient will benefit from skilled therapeutic intervention in order to improve the following deficits and impairments:     Visit Diagnosis: Muscle weakness (generalized)  Acute bilateral low back pain without sciatica  Cramp and spasm     Problem List Patient Active Problem List   Diagnosis Date Noted  . Vitamin D deficiency 12/03/2019  . Hyperlipidemia 12/03/2019  . 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 Hypertension 05/03/2016  . Low back pain 06/16/2014  . Anemia due to other cause 03/24/2014  . Routine general medical examination at a health care facility 03/24/2014  . VSD (ventricular septal defect) 03/09/2011  . URTICARIA 06/03/2008  . SEBACEOUS CYST 10/09/2007  . Asthma 10/31/2006    1:11 PM,06/25/20 08/25/20 PT, DPT [facility]   [phone]  [facility] [address] [place], [place], [place] Phone: [phone]   Fax:  [phone]  Name: Elizabeth Lozano MRN: [name] Date of Birth: 21-May-1970

## 2020-06-29 ENCOUNTER — Encounter: Payer: Self-pay | Admitting: Physical Therapy

## 2020-06-29 ENCOUNTER — Other Ambulatory Visit: Payer: Self-pay

## 2020-06-29 ENCOUNTER — Ambulatory Visit: Payer: BC Managed Care – PPO | Admitting: Physical Therapy

## 2020-06-29 DIAGNOSIS — M545 Low back pain, unspecified: Secondary | ICD-10-CM | POA: Diagnosis not present

## 2020-06-29 DIAGNOSIS — M6281 Muscle weakness (generalized): Secondary | ICD-10-CM

## 2020-06-29 DIAGNOSIS — R252 Cramp and spasm: Secondary | ICD-10-CM | POA: Diagnosis not present

## 2020-06-29 NOTE — Patient Instructions (Signed)
Sink hang  Seated ball rollouts in tucked pelvis, side to side fixing opposite pelvis into chair Child's pose with hands (thumbs up) on large physioball for lat stretch Foot on 2nd step, lunge in for hip flexor stretch, then sit back and straighten leg for hamstring stretch, breathe into muscle you are stretching before switching positions

## 2020-06-29 NOTE — Therapy (Signed)
[facility] [address], [address] [place], [place], [address] Phone: [phone]   Fax:  [phone]  Physical Therapy Treatment  Patient Details  Name: Elizabeth Lozano MRN: [id number] Date of Birth: 1971/01/26 Referring Provider (PT): Dr Philip Aspen   Encounter Date: 06/29/2020   PT End of Session - 06/29/20 1018    Visit Number 5    Number of Visits 24    Date for PT Re-Evaluation 08/11/20    Authorization Type [employer] 30 visit limit/6 used    Authorization - Visit Number 5    Authorization - Number of Visits 24    PT Start Time 0932    PT Stop Time 1015    PT Time Calculation (min) 43 min    Activity Tolerance Patient tolerated treatment well;No increased pain    Behavior During Therapy WFL for tasks assessed/performed           Past Medical History:  Diagnosis Date  . Allergy    she has seen Dr. Eileen Stanford   . Anemia    long term issues per pt  . Anxiety    due to back pain   . Asthma   . Depression   . Heart murmur   . History of stress test    ETT-echo (9/14):  ST depression noted on ECGs; no wall motion abnormalities at peak exercise on echocardiogram  . Hypertension   . VSD (ventricular septal defect)    Cardiac CTA 05/2008: Membranous VSD, 8-9 mm in diameter, mild RVE, EF 64%, calcium score 0, no CAD. Last echo 02/2011: EF 55-60%, mild CAD, perimembranous VSD slightly more prominent but no RVE or pulmonary hypertension;  Echo (9/14): Small perimembranous VSD-no significant change since 02/2011; EF 55-60%, moderate LAE    Past Surgical History:  Procedure Laterality Date  . cyst left wrist    . dental implants    . WISDOM TOOTH EXTRACTION      There were no vitals filed for this visit.   Subjective Assessment - 06/29/20 0934    Subjective The bridges feel like they feed into more tightness.  My QLs start to feel like they'll spasm. I did 8 min on elliptical yesterday and did well.    Limitations House  hold activities;Walking;Sitting    How long can you sit comfortably? 5 minutes    How long can you walk comfortably? not at all.    Diagnostic tests none    Patient Stated Goals I just want to get back to where I was in January.  I know I'll always have some pain.    Pain Score 2     Pain Location Back    Pain Orientation Left;Right;Lower    Pain Type Chronic pain    Pain Onset More than a month ago    Pain Frequency Intermittent                             [facility] Adult PT Treatment/Exercise - 06/29/20 0001      Exercises   Exercises Shoulder      Lumbar Exercises: Stretches   Piriformis Stretch 1 rep;Left;Right;30 seconds    Piriformis Stretch Limitations edge of mat table    Other Lumbar Stretch Exercise lumbar squat hang through UEs holding inside of sink 2x10 sec    Other Lumbar Stretch Exercise lumbar flexion seated edge of mat table with green ball rollouts with SB for fascial stretch  5x5 sec holds each way      Knee/Hip Exercises: Standing   Other Standing Knee Exercises foot on 2nd step hip flexor stretch to hamstring stretch 3 rounds each side, dynamic with 5 sec holds each      Shoulder Exercises: ROM/Strengthening   Other ROM/Strengthening Exercises lat stretch bil UEs on large green ball kneeling child's pose 1x30 sec    Other ROM/Strengthening Exercises kneeling QL stretch SB with overhead reach inhale into ipsilateral side x 3 reps each way      Manual Therapy   Manual Therapy Joint mobilization;Myofascial release    Joint Mobilization lumbar PAs and UPAs on Lt Gr II/III    Myofascial Release thoracolumbar fascia, fascial lines along obliques in prone with manual passive thoracic rotation, fascia along QL bil - all in prone                  PT Education - 06/29/20 1013    Education Details added typed list of stretches from today since specific and not on [employer]    Person(s) Educated Patient    Methods  Explanation;Demonstration;Handout    Comprehension Verbalized understanding;Returned demonstration            PT Short Term Goals - 06/29/20 1252      PT SHORT TERM GOAL #1   Title be independent with initial HEP    Status On-going      PT SHORT TERM GOAL #2   Title report a 30% reduction in bil hip and back pain with sitting and standing    Status On-going      PT SHORT TERM GOAL #3   Title Pt with improved bil hip flexibility (quads, hip flexorsand right external rotators)   to improve walking time to 10 min    Status On-going      PT SHORT TERM GOAL #4   Title Pt will be able to sit 10 min with pain level 4/10    Status On-going      PT SHORT TERM GOAL #5   Title Lumbar extension ROM to 20 degrees for improved stride length with gait    Status On-going             PT Long Term Goals - 06/16/20 2128      PT LONG TERM GOAL #1   Title be independent in advanced HEP    Time 8    Period Weeks    Status New    Target Date 08/11/20      PT LONG TERM GOAL #2   Title Improve FOTO score from 48% to 65%    Time 8    Period Weeks    Status New      PT LONG TERM GOAL #3   Title Pt will be able to sit for driving, play computer games, watching TV up to 20 min with min pain    Time 8    Period Weeks    Status New      PT LONG TERM GOAL #4   Title The patient will have improved hip and lumbo pelvic strength to grossly 4/5 needed for standing/walking longer periods of time    Time 8    Period Weeks    Status New      PT LONG TERM GOAL #5   Title The patient will be able to walk 15-20 min with min pain    Time 8    Period Weeks    Status New  Additional Long Term Goals   Additional Long Term Goals Yes                 Plan - 06/29/20 1246    Clinical Impression Statement Pt arrived having done 8' on elliptical yesterday with good tolerance.  Rating of tightness on arrival was 1-2/10 today.  She is liking her HEP so far but does note increased  tightness along sacrum and lumbar region with small range supine bridges.  PT discussed the likelihood of fascial restriction vs active TPs contributing to her feeling of tightness across abodminals, ribcage and lumbar region.  PT targeted active stretching today with demo and VCs to ensure proper target and form were reached, followed by superficial to progressively deeper myofascial release in prone which was well tolerated by Pt. She reported much reduced tension and improved mobility end of session.  She had signif myofascial restriction Lt>Rt along thoracodorsal fascia, QL, and obliques which responded well to manual therapy today.  She may need a different alternative to bridges for glut strength such as step up with contralateral hip ext.    Comorbidities chronic history; multi regions affected;  Depression; Asthma; HTN    Rehab Potential Good    PT Frequency 2x / week    PT Duration 8 weeks    PT Treatment/Interventions ADLs/Self Care Home Management;Aquatic Therapy;Cryotherapy;Electrical Stimulation;Ultrasound;Traction;Moist Heat;Iontophoresis 4mg /ml Dexamethasone;Therapeutic activities;Therapeutic exercise;Neuromuscular re-education;Manual techniques;Patient/family education;Dry needling;Spinal Manipulations;Taping    PT Next Visit Plan continue active stretching, myofascial release in prone, find alternative to supine bridge (step up with contralateral hip ext?)    PT Home Exercise Plan see Pt instructions (breathwork, open books, hip flexor, quad and hamstring stretches from previous HEP)    Consulted and Agree with Plan of Care Patient           Patient will benefit from skilled therapeutic intervention in order to improve the following deficits and impairments:     Visit Diagnosis: Muscle weakness (generalized)  Acute bilateral low back pain without sciatica     Problem List Patient Active Problem List   Diagnosis Date Noted  . Vitamin D deficiency 12/03/2019  .  Hyperlipidemia 12/03/2019  . 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 Hypertension 05/03/2016  . Low back pain 06/16/2014  . Anemia due to other cause 03/24/2014  . Routine general medical examination at a health care facility 03/24/2014  . VSD (ventricular septal defect) 03/09/2011  . URTICARIA 06/03/2008  . SEBACEOUS CYST 10/09/2007  . Asthma 10/31/2006    Noah Lembke E Bedie Dominey 06/29/2020, 12:53 PM  [facility] [address] [place], [place], [place] Phone: [phone]   Fax:  [phone]  Name: Elizabeth Lozano MRN: [name] Date of Birth: [birth year]

## 2020-07-02 ENCOUNTER — Other Ambulatory Visit: Payer: Self-pay

## 2020-07-02 ENCOUNTER — Ambulatory Visit: Payer: BC Managed Care – PPO | Admitting: Physical Therapy

## 2020-07-02 DIAGNOSIS — R252 Cramp and spasm: Secondary | ICD-10-CM | POA: Diagnosis not present

## 2020-07-02 DIAGNOSIS — M545 Low back pain, unspecified: Secondary | ICD-10-CM

## 2020-07-02 DIAGNOSIS — M6281 Muscle weakness (generalized): Secondary | ICD-10-CM | POA: Diagnosis not present

## 2020-07-02 NOTE — Patient Instructions (Signed)
Access Code: [facility] URL: [email or web address] Date: 07/02/2020 Prepared by: Lavinia Sharps  Exercises Standing Hip Flexor Stretch - 1 x daily - 7 x weekly - 1 sets - 3 reps - 10 hold Side Lunge Adductor Stretch - 1 x daily - 7 x weekly - 1 sets - 3 reps - 10 hold Standing Quadriceps Stretch - 1 x daily - 7 x weekly - 1 sets - 3 reps - 10 hold Seated Figure 4 Piriformis Stretch - 1 x daily - 7 x weekly - 3 sets - 2 reps - 30 hold Supine Figure 4 Piriformis Stretch - 1 x daily - 7 x weekly - 3 sets - 2 reps - 30 hold Sidelying Open Book Thoracic Rotation with Knee on Foam Roll - 2 x daily - 7 x weekly - 1 sets - 10 reps Prone Press Up - 2 x daily - 7 x weekly - 1 sets - 10 reps Pigeon Pose - 1 x daily - 7 x weekly - 3 sets - 10 reps [facility] with Gluteal Activation - 1 x daily - 7 x weekly - 3 sets - 10 reps Supine Bilateral Isometric Hip Flexion - 1 x daily - 7 x weekly - 3 sets - 10 reps Quadruped Adductor Stretch - 1 x daily - 7 x weekly - 1 sets - 3 reps - 20-30 hold Single Arm Doorway Pec Stretch at 120 Degrees Abduction - 1 x daily - 7 x weekly - 3 sets - 5 reps Beginner Bridge - 1 x daily - 7 x weekly - 2-3 sets - 5 reps Clamshell - 1 x daily - 7 x weekly - 1 sets - 10 reps Standing Isometric Hip Abduction with Ball on Wall - 1 x daily - 7 x weekly - 1 sets - 1-3 reps - 5 hold Hooklying Transversus Abdominis Palpation - 1 x daily - 7 x weekly - 1 sets - 5 reps Supine Transversus Abdominis Bracing with Double Leg Fallout - 1 x daily - 7 x weekly - 1 sets - 10 reps Supine Transversus Abdominis Bracing with Heel Slide - 1 x daily - 7 x weekly - 1 sets - 10 reps

## 2020-07-02 NOTE — Therapy (Signed)
[facility] [address] [place], [place], [address] Phone: [phone]   Fax:  [phone]  Physical Therapy Treatment  Patient Details  Name: Elizabeth Lozano MRN: [id number] Date of Birth: 22-Mar-1970 Referring Provider (PT): Dr Philip Aspen   Encounter Date: 07/02/2020   PT End of Session - 07/02/20 2004    Visit Number 6    Number of Visits 24    Date for PT Re-Evaluation 08/11/20    Authorization Type BCBS 30 visit limit/6 used    Authorization - Visit Number 6    Authorization - Number of Visits 24    PT Start Time 0845    PT Stop Time 0930    PT Time Calculation (min) 45 min    Activity Tolerance Patient tolerated treatment well;No increased pain           Past Medical History:  Diagnosis Date  . Allergy    she has seen Dr. Eileen Stanford   . Anemia    long term issues per pt  . Anxiety    due to back pain   . Asthma   . Depression   . Heart murmur   . History of stress test    ETT-echo (9/14):  ST depression noted on ECGs; no wall motion abnormalities at peak exercise on echocardiogram  . Hypertension   . VSD (ventricular septal defect)    Cardiac CTA 05/2008: Membranous VSD, 8-9 mm in diameter, mild RVE, EF 64%, calcium score 0, no CAD. Last echo 02/2011: EF 55-60%, mild CAD, perimembranous VSD slightly more prominent but no RVE or pulmonary hypertension;  Echo (9/14): Small perimembranous VSD-no significant change since 02/2011; EF 55-60%, moderate LAE    Past Surgical History:  Procedure Laterality Date  . cyst left wrist    . dental implants    . WISDOM TOOTH EXTRACTION      There were no vitals filed for this visit.   Subjective Assessment - 07/02/20 0844    Subjective I went to the food distribution the past 2 Wednesdays.  Last week was bad, yesterday not as bad.  Tightness both hip flexors left worse.  Overall the best week I've had in a while.  That deep squat felt good at the time but that  set off my hip flexors.  Even tiny bridges set me off.    Patient Stated Goals I just want to get back to where I was in January.  I know I'll always have some pain.    Currently in Pain? Yes    Pain Score 2     Pain Location Hip    Pain Orientation Right;Left                             [facility] Adult PT Treatment/Exercise - 07/02/20 0001      Lumbar Exercises: Stretches   Piriformis Stretch 1 rep;Left;Right;30 seconds    Piriformis Stretch Limitations edge of mat table      Lumbar Exercises: Supine   Ab Set 10 reps    Heel Slides 10 reps    Heel Slides Limitations 1/2 slide out with core set to pull leg in    Other Supine Lumbar Exercises core set with clams 10x      Knee/Hip Exercises: Standing   Other Standing Knee Exercises foot on 2nd step hip flexor stretch    Other Standing Knee Exercises hip abduction with ball on  wall 3 sec holds submax      Knee/Hip Exercises: Sidelying   Clams 10x tipped forward small arc of movement                  PT Education - 07/02/20 2003    Education Details sidelying clams; ab brace with 1/2 heel slides and supine clams; standing glute med ball on wall isometric    Person(s) Educated Patient    Methods Explanation;Demonstration;Handout    Comprehension Returned demonstration;Verbalized understanding            PT Short Term Goals - 06/29/20 1252      PT SHORT TERM GOAL #1   Title be independent with initial HEP    Status On-going      PT SHORT TERM GOAL #2   Title report a 30% reduction in bil hip and back pain with sitting and standing    Status On-going      PT SHORT TERM GOAL #3   Title Pt with improved bil hip flexibility (quads, hip flexorsand right external rotators)   to improve walking time to 10 min    Status On-going      PT SHORT TERM GOAL #4   Title Pt will be able to sit 10 min with pain level 4/10    Status On-going      PT SHORT TERM GOAL #5   Title Lumbar extension ROM to 20 degrees  for improved stride length with gait    Status On-going             PT Long Term Goals - 06/16/20 2128      PT LONG TERM GOAL #1   Title be independent in advanced HEP    Time 8    Period Weeks    Status New    Target Date 08/11/20      PT LONG TERM GOAL #2   Title Improve FOTO score from 48% to 65%    Time 8    Period Weeks    Status New      PT LONG TERM GOAL #3   Title Pt will be able to sit for driving, play computer games, watching TV up to 20 min with min pain    Time 8    Period Weeks    Status New      PT LONG TERM GOAL #4   Title The patient will have improved hip and lumbo pelvic strength to grossly 4/5 needed for standing/walking longer periods of time    Time 8    Period Weeks    Status New      PT LONG TERM GOAL #5   Title The patient will be able to walk 15-20 min with min pain    Time 8    Period Weeks    Status New      Additional Long Term Goals   Additional Long Term Goals Yes                 Plan - 07/02/20 2004    Clinical Impression Statement Significant asymmetrical glute med weakness on left most evident with isometric wall hip abductions (major difficulty stabilizing on left to push with right thigh).  Discussed concept of tightness in hip flexors and lumbar musculature as a compensatory strategy for this weakness of left glute medius.  Discussed the numerous ex alternatives if one ex aggravates (bridges and deep fascial squat) there are other modifications that can target the same  muscles.  Therapist closely monitoring response, providing technical cues and modifying as needed.    Comorbidities chronic history; multi regions affected;  Depression; Asthma; HTN    Examination-Activity Limitations Locomotion Level;Sit;Carry;Squat;Lift;Stand;Stairs    Examination-Participation Restrictions Meal Prep;Cleaning;Occupation;Shop;Laundry;Community Activity    Rehab Potential Good    PT Frequency 2x / week    PT Duration 8 weeks    PT  Treatment/Interventions ADLs/Self Care Home Management;Aquatic Therapy;Cryotherapy;Electrical Stimulation;Ultrasound;Traction;Moist Heat;Iontophoresis 4mg /ml Dexamethasone;Therapeutic activities;Therapeutic exercise;Neuromuscular re-education;Manual techniques;Patient/family education;Dry needling;Spinal Manipulations;Taping    PT Next Visit Plan follow up on clams, glute med isometric on wall, core setting in supine;  continue active stretching, myofascial release in prone, hold bridge (step up with contralateral hip ext?); hold on deep squat           Patient will benefit from skilled therapeutic intervention in order to improve the following deficits and impairments:  Decreased range of motion,Difficulty walking,Increased fascial restricitons,Impaired UE functional use,Pain,Decreased activity tolerance,Impaired flexibility,Decreased strength  Visit Diagnosis: Muscle weakness (generalized)  Acute bilateral low back pain without sciatica  Cramp and spasm     Problem List Patient Active Problem List   Diagnosis Date Noted  . Vitamin D deficiency 12/03/2019  . Hyperlipidemia 12/03/2019  . 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 Hypertension 05/03/2016  . Low back pain 06/16/2014  . Anemia due to other cause 03/24/2014  . Routine general medical examination at a health care facility 03/24/2014  . VSD (ventricular septal defect) 03/09/2011  . URTICARIA 06/03/2008  . SEBACEOUS CYST 10/09/2007  . Asthma 10/31/2006   11/02/2006, PT 07/02/20 8:13 PM Phone: [phone] Fax: [phone] 07/02/2020, 8:12 PM  [facility] [address], [address] [place], [place], [place] Phone: [phone]   Fax:  [phone]  Name: Elizabeth Lozano MRN: [name] Date of Birth: 1971/01/03

## 2020-07-06 ENCOUNTER — Other Ambulatory Visit: Payer: Self-pay

## 2020-07-06 ENCOUNTER — Encounter: Payer: Self-pay | Admitting: Physical Therapy

## 2020-07-06 ENCOUNTER — Ambulatory Visit: Payer: BC Managed Care – PPO | Admitting: Physical Therapy

## 2020-07-06 DIAGNOSIS — R252 Cramp and spasm: Secondary | ICD-10-CM | POA: Diagnosis not present

## 2020-07-06 DIAGNOSIS — M545 Low back pain, unspecified: Secondary | ICD-10-CM | POA: Diagnosis not present

## 2020-07-06 DIAGNOSIS — M6281 Muscle weakness (generalized): Secondary | ICD-10-CM

## 2020-07-06 NOTE — Patient Instructions (Signed)
Access Code: [facility] URL: [email or web address] Date: 07/06/2020 Prepared by: Loistine Simas Taliana Mersereau  Exercises Standing Hip Flexor Stretch - 1 x daily - 7 x weekly - 1 sets - 3 reps - 10 hold Side Lunge Adductor Stretch - 1 x daily - 7 x weekly - 1 sets - 3 reps - 10 hold Standing Quadriceps Stretch - 1 x daily - 7 x weekly - 1 sets - 3 reps - 10 hold Seated Figure 4 Piriformis Stretch - 1 x daily - 7 x weekly - 3 sets - 2 reps - 30 hold Supine Figure 4 Piriformis Stretch - 1 x daily - 7 x weekly - 3 sets - 2 reps - 30 hold Sidelying Open Book Thoracic Rotation with Knee on Foam Roll - 2 x daily - 7 x weekly - 1 sets - 10 reps Prone Press Up - 2 x daily - 7 x weekly - 1 sets - 10 reps Pigeon Pose - 1 x daily - 7 x weekly - 3 sets - 10 reps [facility] with Gluteal Activation - 1 x daily - 7 x weekly - 3 sets - 10 reps Supine Bilateral Isometric Hip Flexion - 1 x daily - 7 x weekly - 3 sets - 10 reps Quadruped Adductor Stretch - 1 x daily - 7 x weekly - 1 sets - 3 reps - 20-30 hold Single Arm Doorway Pec Stretch at 120 Degrees Abduction - 1 x daily - 7 x weekly - 3 sets - 5 reps Beginner Bridge - 1 x daily - 7 x weekly - 2-3 sets - 5 reps Clamshell - 1 x daily - 7 x weekly - 1 sets - 10 reps Standing Isometric Hip Abduction with Ball on Wall - 1 x daily - 7 x weekly - 1 sets - 1-3 reps - 5 hold Hooklying Transversus Abdominis Palpation - 1 x daily - 7 x weekly - 1 sets - 5 reps Supine Transversus Abdominis Bracing with Double Leg Fallout - 1 x daily - 7 x weekly - 1 sets - 10 reps Supine Transversus Abdominis Bracing with Heel Slide - 1 x daily - 7 x weekly - 1 sets - 10 reps Supine Lower Trunk Rotation - 1 x daily - 7 x weekly - 1 sets - 2 reps - 20 hold Dead Bug - 1 x daily - 7 x weekly - 1 sets - 10 reps

## 2020-07-06 NOTE — Therapy (Signed)
[facility] [address] [place], [place], [address] Phone: [phone]   Fax:  [phone]  Physical Therapy Treatment  Patient Details  Name: Elizabeth Lozano MRN: [id number] Date of Birth: 30-Aug-1970 Referring Provider (PT): Dr Philip Aspen   Encounter Date: 07/06/2020   PT End of Session - 07/06/20 0933    Visit Number 7    Number of Visits 24    Date for PT Re-Evaluation 08/11/20    Authorization Type BCBS 30 visit limit/6 used    Authorization - Visit Number 7    Authorization - Number of Visits 24    PT Start Time 0933    PT Stop Time 1012    PT Time Calculation (min) 39 min    Activity Tolerance Patient tolerated treatment well;No increased pain    Behavior During Therapy WFL for tasks assessed/performed           Past Medical History:  Diagnosis Date  . Allergy    she has seen Dr. Eileen Stanford   . Anemia    long term issues per pt  . Anxiety    due to back pain   . Asthma   . Depression   . Heart murmur   . History of stress test    ETT-echo (9/14):  ST depression noted on ECGs; no wall motion abnormalities at peak exercise on echocardiogram  . Hypertension   . VSD (ventricular septal defect)    Cardiac CTA 05/2008: Membranous VSD, 8-9 mm in diameter, mild RVE, EF 64%, calcium score 0, no CAD. Last echo 02/2011: EF 55-60%, mild CAD, perimembranous VSD slightly more prominent but no RVE or pulmonary hypertension;  Echo (9/14): Small perimembranous VSD-no significant change since 02/2011; EF 55-60%, moderate LAE    Past Surgical History:  Procedure Laterality Date  . cyst left wrist    . dental implants    . WISDOM TOOTH EXTRACTION      There were no vitals filed for this visit.   Subjective Assessment - 07/06/20 0932    Subjective the hip abduction into the wall seems to activate my trunk in the way that a side plank does and I get tight all the way up.  I do feel like we are on the right track  with everything though.    Limitations House hold activities;Walking;Sitting    How long can you sit comfortably? 5 minutes    How long can you walk comfortably? not at all.    Diagnostic tests none    Patient Stated Goals I just want to get back to where I was in January.  I know I'll always have some pain.    Pain Score 4     Pain Location Hip    Pain Orientation Left;Right;Anterior    Pain Descriptors / Indicators Aching    Pain Type Chronic pain    Pain Onset More than a month ago    Pain Frequency Intermittent    Aggravating Factors  sitting>standing, walking    Pain Relieving Factors DN, heat, working on figuring out what I can tolerate for exercise                             [facility] Adult PT Treatment/Exercise - 07/06/20 0001      Neuro Re-ed    Neuro Re-ed Details  ongoing cues for postural, abdominal and hip stabilization with all ther ex  Exercises   Exercises Lumbar;Knee/Hip;Shoulder      Lumbar Exercises: Stretches   Lower Trunk Rotation 1 rep;30 seconds    Lower Trunk Rotation Limitations with active reach of top leg length for added stretch      Lumbar Exercises: Supine   Heel Slides 5 reps    Heel Slides Limitations 1/2 slide out with core set to pull leg in    Dead Bug 5 reps    Dead Bug Limitations 3lb dumbbell in UEs, heel slide with contralateral arm   VC for knitted ribcage and TA awareness     Lumbar Exercises: Sidelying   Clam 10 reps;Both    Clam Limitations 2x5, PT cued hips not as flexed, stack pelvis, engage TA, use bottom knee into table to stabilize, then clam - Pt reports more awareness for gluts in this set up      Knee/Hip Exercises: Stretches   Lobbyist Both;1 rep;30 seconds    Quad Stretch Limitations standing    Hip Flexor Stretch Both;1 rep;30 seconds    Hip Flexor Stretch Limitations with arms overhead with contralateral SB      Knee/Hip Exercises: Aerobic   Elliptical 3.5' PT present to discuss plan for  session      Knee/Hip Exercises: Sidelying   Clams 2x5 bil, TC/VC for stacked pelvis, lower leg into mat table to stabilize, TA, then clam                  PT Education - 07/06/20 1010    Education Details added cues for SL clam to enhance localized strength awareness and stabilization, dead bugs with heel slide, lower trunk rotation    Person(s) Educated Patient    Methods Explanation;Demonstration;Handout    Comprehension Verbalized understanding;Returned demonstration            PT Short Term Goals - 06/29/20 1252      PT SHORT TERM GOAL #1   Title be independent with initial HEP    Status On-going      PT SHORT TERM GOAL #2   Title report a 30% reduction in bil hip and back pain with sitting and standing    Status On-going      PT SHORT TERM GOAL #3   Title Pt with improved bil hip flexibility (quads, hip flexorsand right external rotators)   to improve walking time to 10 min    Status On-going      PT SHORT TERM GOAL #4   Title Pt will be able to sit 10 min with pain level 4/10    Status On-going      PT SHORT TERM GOAL #5   Title Lumbar extension ROM to 20 degrees for improved stride length with gait    Status On-going             PT Long Term Goals - 06/16/20 2128      PT LONG TERM GOAL #1   Title be independent in advanced HEP    Time 8    Period Weeks    Status New    Target Date 08/11/20      PT LONG TERM GOAL #2   Title Improve FOTO score from 48% to 65%    Time 8    Period Weeks    Status New      PT LONG TERM GOAL #3   Title Pt will be able to sit for driving, play computer games, watching TV up to 20 min with min  pain    Time 8    Period Weeks    Status New      PT LONG TERM GOAL #4   Title The patient will have improved hip and lumbo pelvic strength to grossly 4/5 needed for standing/walking longer periods of time    Time 8    Period Weeks    Status New      PT LONG TERM GOAL #5   Title The patient will be able to walk  15-20 min with min pain    Time 8    Period Weeks    Status New      Additional Long Term Goals   Additional Long Term Goals Yes                 Plan - 07/06/20 1013    Clinical Impression Statement Pt arrived with some report of increased sense of tightness along lateral trunk with efforts to do standing hip abd isom into wall.  She also was unsure she was doing and feeling the hip ER clam properly.  Session spent discussing experience of pain/pain neuroscience education, activation of muscles, and tendency for outer muscles to substitute for deeper muscle stabilizer weakness.  Pt felt more focused success with clam with cues for position set up to activate contralateral hip and TA before performing clam.  PT also added supine lower trunk rotation with active reach of top leg for oblique/QL stretch and started dead bug series.  Pt felt very optimistic end of session and felt reduced pain and tightness.  PT progressed HEP and added written cues for clam for improved success with this.    Comorbidities chronic history; multi regions affected;  Depression; Asthma; HTN    Rehab Potential Good    PT Frequency 2x / week    PT Duration 8 weeks    PT Treatment/Interventions ADLs/Self Care Home Management;Aquatic Therapy;Cryotherapy;Electrical Stimulation;Ultrasound;Traction;Moist Heat;Iontophoresis 4mg /ml Dexamethasone;Therapeutic activities;Therapeutic exercise;Neuromuscular re-education;Manual techniques;Patient/family education;Dry needling;Spinal Manipulations;Taping    PT Next Visit Plan elliptical x 3-4', quad/hip flexor stretch, lower trunk rotation, review clams, dead bugs, try lateral step ups, SLS vector taps, pallof press yellow    PT Home Exercise Plan see Pt instructions (breathwork, open books, hip flexor, quad and hamstring stretches from previous HEP)    Consulted and Agree with Plan of Care Patient           Patient will benefit from skilled therapeutic intervention in order  to improve the following deficits and impairments:     Visit Diagnosis: Muscle weakness (generalized)  Acute bilateral low back pain without sciatica  Cramp and spasm     Problem List Patient Active Problem List   Diagnosis Date Noted  . Vitamin D deficiency 12/03/2019  . Hyperlipidemia 12/03/2019  . 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 Hypertension 05/03/2016  . Low back pain 06/16/2014  . Anemia due to other cause 03/24/2014  . Routine general medical examination at a health care facility 03/24/2014  . VSD (ventricular septal defect) 03/09/2011  . URTICARIA 06/03/2008  . SEBACEOUS CYST 10/09/2007  . Asthma 10/31/2006    11/02/2006, PT 07/06/20 12:35 PM   [facility] [address] [place], [place], [place] Phone: [phone]   Fax:  [phone]  Name: Elizabeth Lozano MRN: [name] Date of Birth: 1970/12/22

## 2020-07-08 ENCOUNTER — Ambulatory Visit: Payer: BC Managed Care – PPO | Admitting: Physical Therapy

## 2020-07-14 ENCOUNTER — Ambulatory Visit: Payer: BC Managed Care – PPO | Admitting: Physical Therapy

## 2020-07-14 ENCOUNTER — Other Ambulatory Visit: Payer: Self-pay

## 2020-07-14 DIAGNOSIS — M6281 Muscle weakness (generalized): Secondary | ICD-10-CM | POA: Diagnosis not present

## 2020-07-14 DIAGNOSIS — M545 Low back pain, unspecified: Secondary | ICD-10-CM

## 2020-07-14 DIAGNOSIS — R252 Cramp and spasm: Secondary | ICD-10-CM | POA: Diagnosis not present

## 2020-07-14 NOTE — Patient Instructions (Signed)
Access Code: [facility] URL: [email or web address] Date: 07/14/2020 Prepared by: Lavinia Sharps  Exercises Standing Hip Flexor Stretch - 1 x daily - 7 x weekly - 1 sets - 3 reps - 10 hold Side Lunge Adductor Stretch - 1 x daily - 7 x weekly - 1 sets - 3 reps - 10 hold Standing Quadriceps Stretch - 1 x daily - 7 x weekly - 1 sets - 3 reps - 10 hold Seated Figure 4 Piriformis Stretch - 1 x daily - 7 x weekly - 3 sets - 2 reps - 30 hold Supine Figure 4 Piriformis Stretch - 1 x daily - 7 x weekly - 3 sets - 2 reps - 30 hold Sidelying Open Book Thoracic Rotation with Knee on Foam Roll - 2 x daily - 7 x weekly - 1 sets - 10 reps Prone Press Up - 2 x daily - 7 x weekly - 1 sets - 10 reps Pigeon Pose - 1 x daily - 7 x weekly - 3 sets - 10 reps [facility] with Gluteal Activation - 1 x daily - 7 x weekly - 3 sets - 10 reps Supine Bilateral Isometric Hip Flexion - 1 x daily - 7 x weekly - 3 sets - 10 reps Quadruped Adductor Stretch - 1 x daily - 7 x weekly - 1 sets - 3 reps - 20-30 hold Single Arm Doorway Pec Stretch at 120 Degrees Abduction - 1 x daily - 7 x weekly - 3 sets - 5 reps Beginner Bridge - 1 x daily - 7 x weekly - 2-3 sets - 5 reps Clamshell - 1 x daily - 7 x weekly - 1 sets - 10 reps Standing Isometric Hip Abduction with Ball on Wall - 1 x daily - 7 x weekly - 1 sets - 1-3 reps - 5 hold Hooklying Transversus Abdominis Palpation - 1 x daily - 7 x weekly - 1 sets - 5 reps Supine Transversus Abdominis Bracing with Double Leg Fallout - 1 x daily - 7 x weekly - 1 sets - 10 reps Supine Transversus Abdominis Bracing with Heel Slide - 1 x daily - 7 x weekly - 1 sets - 10 reps Supine Lower Trunk Rotation - 1 x daily - 7 x weekly - 1 sets - 2 reps - 20 hold Dead Bug - 1 x daily - 7 x weekly - 1 sets - 10 reps Standing Anti-Rotation Press with Anchored Resistance - 1 x daily - 7 x weekly - 2 sets - 10 reps

## 2020-07-14 NOTE — Therapy (Signed)
[facility] [address], [address] [place], [place], [address] Phone: [phone]   Fax:  [phone]  Physical Therapy Treatment  Patient Details  Name: Elizabeth Lozano MRN: [id number] Date of Birth: 1970-04-07 Referring Provider (PT): Dr Philip Aspen   Encounter Date: 07/14/2020   PT End of Session - 07/14/20 1922    Visit Number 8    Number of Visits 24    Date for PT Re-Evaluation 08/11/20    Authorization Type BCBS 30 visit limit/6 used    Authorization - Visit Number 8    Authorization - Number of Visits 24    PT Start Time 0930    PT Stop Time 1015    PT Time Calculation (min) 45 min    Activity Tolerance Patient tolerated treatment well           Past Medical History:  Diagnosis Date  . Allergy    she has seen Dr. Eileen Stanford   . Anemia    long term issues per pt  . Anxiety    due to back pain   . Asthma   . Depression   . Heart murmur   . History of stress test    ETT-echo (9/14):  ST depression noted on ECGs; no wall motion abnormalities at peak exercise on echocardiogram  . Hypertension   . VSD (ventricular septal defect)    Cardiac CTA 05/2008: Membranous VSD, 8-9 mm in diameter, mild RVE, EF 64%, calcium score 0, no CAD. Last echo 02/2011: EF 55-60%, mild CAD, perimembranous VSD slightly more prominent but no RVE or pulmonary hypertension;  Echo (9/14): Small perimembranous VSD-no significant change since 02/2011; EF 55-60%, moderate LAE    Past Surgical History:  Procedure Laterality Date  . cyst left wrist    . dental implants    . WISDOM TOOTH EXTRACTION      There were no vitals filed for this visit.   Subjective Assessment - 07/14/20 0934    Subjective Ab work is working.  Doing better with sitting 30 min at a time but hip flexors are triggered to some extent.  Getting some nice stretches to my butt.  I can't get that glute medius to cooperate.  I'm encouraged though.  I changed shoe sizes and  that caused my abdominals to tighten up for 2 days.    Limitations House hold activities;Walking;Sitting    Patient Stated Goals I just want to get back to where I was in January.  I know I'll always have some pain.    Currently in Pain? Yes    Pain Score 4     Pain Location Hip    Pain Type Chronic pain                             [facility] Adult PT Treatment/Exercise - 07/14/20 0001      Neuro Re-ed    Neuro Re-ed Details  neuroscience of pain education; brain's role in pain      Lumbar Exercises: Stretches   Lower Trunk Rotation 5 reps      Lumbar Exercises: Standing   Other Standing Lumbar Exercises pallof press forward in staggered stance 10x to each side      Lumbar Exercises: Supine   Ab Set 10 reps    Heel Slides 5 reps    Heel Slides Limitations 1/2 slide out with core set to pull leg in  Dead Bug 5 reps    Dead Bug Limitations 3lb dumbbell in UEs, heel slide with contralateral arm   VC for knitted ribcage and TA awareness   Other Supine Lumbar Exercises manual resisted anti rotary muscles hooklying      Lumbar Exercises: Sidelying   Clam 10 reps;Both    Clam Limitations tip forward                  PT Education - 07/14/20 1922    Education Details standing anti rotary pallof press in staggered stance    Person(s) Educated Patient    Methods Explanation;Demonstration   [employer] not printing   Comprehension Verbalized understanding;Returned demonstration            PT Short Term Goals - 06/29/20 1252      PT SHORT TERM GOAL #1   Title be independent with initial HEP    Status On-going      PT SHORT TERM GOAL #2   Title report a 30% reduction in bil hip and back pain with sitting and standing    Status On-going      PT SHORT TERM GOAL #3   Title Pt with improved bil hip flexibility (quads, hip flexorsand right external rotators)   to improve walking time to 10 min    Status On-going      PT SHORT TERM GOAL #4   Title Pt will  be able to sit 10 min with pain level 4/10    Status On-going      PT SHORT TERM GOAL #5   Title Lumbar extension ROM to 20 degrees for improved stride length with gait    Status On-going             PT Long Term Goals - 06/16/20 2128      PT LONG TERM GOAL #1   Title be independent in advanced HEP    Time 8    Period Weeks    Status New    Target Date 08/11/20      PT LONG TERM GOAL #2   Title Improve FOTO score from 48% to 65%    Time 8    Period Weeks    Status New      PT LONG TERM GOAL #3   Title Pt will be able to sit for driving, play computer games, watching TV up to 20 min with min pain    Time 8    Period Weeks    Status New      PT LONG TERM GOAL #4   Title The patient will have improved hip and lumbo pelvic strength to grossly 4/5 needed for standing/walking longer periods of time    Time 8    Period Weeks    Status New      PT LONG TERM GOAL #5   Title The patient will be able to walk 15-20 min with min pain    Time 8    Period Weeks    Status New      Additional Long Term Goals   Additional Long Term Goals Yes                 Plan - 07/14/20 1923    Clinical Impression Statement The patient reports functional improvements in sitting tolerance although she reports times of tightness in hip flexors and abdominals that can be easily exacerbated (going up a size in same brand of shoe).  She attributes improvements to the  initiation of transverse abdominus/core setting and demonstrates good set up and performance of these in supine.  Initiated a progression of core strengthening with adding anti-rotary stabilization in supine and standing (for home).  Good carryover with set up for clam exercise although asymmetrical weakness still evident.  Neuroscience of pain education and the brain's role in pain and how to desensitize the CNS.    Comorbidities chronic history; multi regions affected;  Depression; Asthma; HTN    Examination-Activity Limitations  Locomotion Level;Sit;Carry;Squat;Lift;Stand;Stairs    Examination-Participation Restrictions Meal Prep;Cleaning;Occupation;Shop;Laundry;Community Activity    Rehab Potential Good    PT Frequency 2x / week    PT Duration 8 weeks    PT Treatment/Interventions ADLs/Self Care Home Management;Aquatic Therapy;Cryotherapy;Electrical Stimulation;Ultrasound;Traction;Moist Heat;Iontophoresis 4mg /ml Dexamethasone;Therapeutic activities;Therapeutic exercise;Neuromuscular re-education;Manual techniques;Patient/family education;Dry needling;Spinal Manipulations;Taping    PT Next Visit Plan Give print out of Pallof press;  elliptical x 3-4', quad/hip flexor stretch, lower trunk rotation,  clams, dead bugs, try lateral step ups, SLS vector taps, pallof press red           Patient will benefit from skilled therapeutic intervention in order to improve the following deficits and impairments:  Decreased range of motion,Difficulty walking,Increased fascial restricitons,Impaired UE functional use,Pain,Decreased activity tolerance,Impaired flexibility,Decreased strength  Visit Diagnosis: Muscle weakness (generalized)  Acute bilateral low back pain without sciatica  Cramp and spasm     Problem List Patient Active Problem List   Diagnosis Date Noted  . Vitamin D deficiency 12/03/2019  . Hyperlipidemia 12/03/2019  . 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 Hypertension 05/03/2016  . Low back pain 06/16/2014  . Anemia due to other cause 03/24/2014  . Routine general medical examination at a health care facility 03/24/2014  . VSD (ventricular septal defect) 03/09/2011  . URTICARIA 06/03/2008  . SEBACEOUS CYST 10/09/2007  . Asthma 10/31/2006   11/02/2006, PT 07/14/20 7:35 PM Phone: [phone] Fax: [phone] 07/14/2020, 7:34 PM  [facility] [address], [address] [place], [place], [place] Phone: [phone]   Fax:   [phone]  Name: Elizabeth Lozano MRN: [name] Date of Birth: September 12, 1970

## 2020-07-16 ENCOUNTER — Encounter: Payer: BC Managed Care – PPO | Admitting: Physical Therapy

## 2020-07-20 ENCOUNTER — Encounter: Payer: Self-pay | Admitting: Physical Therapy

## 2020-07-20 ENCOUNTER — Ambulatory Visit: Payer: BC Managed Care – PPO | Attending: Internal Medicine | Admitting: Physical Therapy

## 2020-07-20 ENCOUNTER — Other Ambulatory Visit: Payer: Self-pay

## 2020-07-20 DIAGNOSIS — M6281 Muscle weakness (generalized): Secondary | ICD-10-CM | POA: Diagnosis not present

## 2020-07-20 DIAGNOSIS — M545 Low back pain, unspecified: Secondary | ICD-10-CM | POA: Insufficient documentation

## 2020-07-20 DIAGNOSIS — R252 Cramp and spasm: Secondary | ICD-10-CM | POA: Diagnosis not present

## 2020-07-20 NOTE — Therapy (Signed)
[facility] [address] [place], [place], [address] Phone: [phone]   Fax:  [phone]  Physical Therapy Treatment  Patient Details  Name: Elizabeth Lozano MRN: [id number] Date of Birth: 12-30-1970 Referring Provider (PT): Dr Isaac Bliss   Encounter Date: 07/20/2020   PT End of Session - 07/20/20 1154    Visit Number 9    Number of Visits 24    Date for PT Re-Evaluation 08/11/20    Authorization Type [employer]BS 30 visit limit/6 used    Authorization - Visit Number 9    Authorization - Number of Visits 24    PT Start Time 5038    PT Stop Time 1230    PT Time Calculation (min) 42 min    Activity Tolerance Patient tolerated treatment well    Behavior During Therapy [facility] for tasks assessed/performed           Past Medical History:  Diagnosis Date  . Allergy    she has seen Dr. Tiajuana Amass   . Anemia    long term issues per pt  . Anxiety    due to back pain   . Asthma   . Depression   . Heart murmur   . History of stress test    ETT-echo (9/14):  ST depression noted on ECGs; no wall motion abnormalities at peak exercise on echocardiogram  . Hypertension   . VSD (ventricular septal defect)    Cardiac CTA 05/2008: Membranous VSD, 8-9 mm in diameter, mild RVE, EF 64%, calcium score 0, no CAD. Last echo 02/2011: EF 55-60%, mild CAD, perimembranous VSD slightly more prominent but no RVE or pulmonary hypertension;  Echo (9/14): Small perimembranous VSD-no significant change since 02/2011; EF 55-60%, moderate LAE    Past Surgical History:  Procedure Laterality Date  . cyst left wrist    . dental implants    . WISDOM TOOTH EXTRACTION      There were no vitals filed for this visit.   Subjective Assessment - 07/20/20 1148    Subjective I am starting to be less reactive to sitting.  I think I'm finally doing the clamshells correctly but my muscles get so tight.  I am getting the Rt hip to release but cannot get the Lt one  to release.  I was outside active x 2 hours this morning and then sat on unsupported bench so both QLs are a little pained 3/10 today.    Limitations House hold activities;Walking;Sitting    How long can you sit comfortably? can sit up to 45 min now depending on the day    How long can you walk comfortably? not at all.    Diagnostic tests none    Patient Stated Goals I just want to get back to where I was in January.  I know I'll always have some pain.    Currently in Pain? Yes    Pain Score 3     Pain Location Back    Pain Orientation Right;Left;Lateral    Pain Descriptors / Indicators Aching;Tightness    Pain Type Chronic pain                             [facility] Adult PT Treatment/Exercise - 07/20/20 0001      Exercises   Exercises Lumbar;Knee/Hip;Shoulder      Lumbar Exercises: Stretches   Sports administrator Left;Right;1 rep;30 seconds    Quad Stretch Limitations passive  by PT, prone    Piriformis Stretch 1 rep;20 seconds    Piriformis Stretch Limitations in sidelying, draw knee forward after clam    Other Lumbar Stretch Exercise standing QL with overhead reach bil 3x10" each side      Lumbar Exercises: Standing   Other Standing Lumbar Exercises pallof press forward in staggered stance 10x to each side   green   Other Standing Lumbar Exercises 3lb dumbbell hip to hip and diag shoulder to hip 2x10 each      Lumbar Exercises: Sidelying   Clam 10 reps;Both    Clam Limitations tip forward    Other Sidelying Lumbar Exercises draw knee forward to stretch following clam set and give brief flat handed self-massage      Manual Therapy   Manual Therapy Soft tissue mobilization;Myofascial release    Manual therapy comments left QL, thoracic and lumbar paraspinals in prone    Myofascial Release bil thoracolumbar fascia in prone            Trigger Point Dry Needling - 07/20/20 0001    Consent Given? Yes    Education Handout Provided Previously provided    Muscles  Treated Back/Hip Gluteus medius;Piriformis    Dry Needling Comments Lt only    Gluteus Medius Response Twitch response elicited;Palpable increased muscle length    Piriformis Response Twitch response elicited;Palpable increased muscle length                  PT Short Term Goals - 07/20/20 1156      PT SHORT TERM GOAL #1   Title be independent with initial HEP    Status Achieved      PT SHORT TERM GOAL #2   Title report a 30% reduction in bil hip and back pain with sitting and standing    Status Achieved      PT SHORT TERM GOAL #3   Title Pt with improved bil hip flexibility (quads, hip flexorsand right external rotators)   to improve walking time to 10 min    Status Achieved      PT SHORT TERM GOAL #4   Title Pt will be able to sit 10 min with pain level 4/10    Baseline some days can sit 45 min    Status Achieved      PT SHORT TERM GOAL #5   Title Lumbar extension ROM to 20 degrees for improved stride length with gait    Status Achieved             PT Long Term Goals - 07/20/20 1157      PT LONG TERM GOAL #1   Title be independent in advanced HEP    Status On-going      PT LONG TERM GOAL #3   Title Pt will be able to sit for driving, play computer games, watching TV up to 20 min with min pain    Baseline can drive 20 min    Status On-going      PT LONG TERM GOAL #4   Title The patient will have improved hip and lumbo pelvic strength to grossly 4/5 needed for standing/walking longer periods of time    Status On-going      PT LONG TERM GOAL #5   Title The patient will be able to walk 15-20 min with min pain    Status On-going                 Plan - 07/20/20  1245    Clinical Impression Statement Pt has met all STGs. She reports can now sit for up to 45 min.  She drove 20 min each way this morning to meet a friend and was able to be active outdoors x 2 hours prior to coming to session.  She did report increased feeling of tightness in bil hips with  clamshells so PT encouraged drawing knee forward and giving a stretch and self-massage after reps.  Pallof press was reviewed today with good understanding and form.  PT added dumbbell taps hip to hip and diagonal hip to shoulder in standing today for core stabilization addition.  Pt continues to have Lt sided tension and TP in Lt lateral hip and lower quadrant which responded well to STM and DN today.  Pt is very encouraged and is making progress toward LTGs.  She has tapered schedule to 1x/week given improved status.  Continue along POC.    Comorbidities chronic history; multi regions affected;  Depression; Asthma; HTN    PT Frequency 2x / week    PT Duration 8 weeks    PT Treatment/Interventions ADLs/Self Care Home Management;Aquatic Therapy;Cryotherapy;Electrical Stimulation;Ultrasound;Traction;Moist Heat;Iontophoresis 68m/ml Dexamethasone;Therapeutic activities;Therapeutic exercise;Neuromuscular re-education;Manual techniques;Patient/family education;Dry needling;Spinal Manipulations;Taping    PT Next Visit Plan f/u on DN to Lt hip, review core stab from last visit, try lateral step ups, SLS vector taps, manual as needed to thoracic/lumbar fascia and paraspinals, Lt hip    PT Home Exercise Plan see Pt instructions (breathwork, open books, hip flexor, quad and hamstring stretches from previous HEP)    Consulted and Agree with Plan of Care Patient           Patient will benefit from skilled therapeutic intervention in order to improve the following deficits and impairments:     Visit Diagnosis: Muscle weakness (generalized)  Acute bilateral low back pain without sciatica  Cramp and spasm     Problem List Patient Active Problem List   Diagnosis Date Noted  . Vitamin D deficiency 12/03/2019  . Hyperlipidemia 12/03/2019  . 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 Hypertension 05/03/2016  . Low back pain 06/16/2014  . Anemia due to other cause 03/24/2014  . Routine general medical  examination at a health care facility 03/24/2014  . VSD (ventricular septal defect) 03/09/2011  . URTICARIA 06/03/2008  . SEBACEOUS CYST 10/09/2007  . Asthma 10/31/2006    JBaruch Merl PT 07/20/20 12:58 PM   [facility] [address] [place] [address]Phone: [phone]  Fax:  [phone] Name: KMallorie NorrodMRN: [id number] of Birth: 227-Sep-1972

## 2020-07-22 ENCOUNTER — Encounter: Payer: BC Managed Care – PPO | Admitting: Physical Therapy

## 2020-07-30 ENCOUNTER — Ambulatory Visit: Payer: BC Managed Care – PPO | Admitting: Physical Therapy

## 2020-07-30 ENCOUNTER — Other Ambulatory Visit: Payer: Self-pay

## 2020-07-30 DIAGNOSIS — M545 Low back pain, unspecified: Secondary | ICD-10-CM | POA: Diagnosis not present

## 2020-07-30 DIAGNOSIS — R252 Cramp and spasm: Secondary | ICD-10-CM

## 2020-07-30 DIAGNOSIS — M6281 Muscle weakness (generalized): Secondary | ICD-10-CM | POA: Diagnosis not present

## 2020-07-30 NOTE — Therapy (Signed)
[facility] [address], [place], [place], [address] Phone: [phone]   Fax:  [phone]  Physical Therapy Treatment  Patient Details  Name: Elizabeth Lozano MRN: [id number] Date of Birth: 07-May-1970 Referring Provider (PT): Dr Isaac Bliss   Encounter Date: 07/30/2020   PT End of Session - 07/30/20 1125     Visit Number 10    Number of Visits 24    Date for PT Re-Evaluation 08/11/20    Authorization Type BCBS 30 visit limit/6 used    Authorization - Visit Number 10    Authorization - Number of Visits 24    PT Start Time 6237    PT Stop Time 1100    PT Time Calculation (min) 45 min    Activity Tolerance Patient tolerated treatment well             Past Medical History:  Diagnosis Date   Allergy    she has seen Dr. Tiajuana Amass    Anemia    long term issues per pt   Anxiety    due to back pain    Asthma    Depression    Heart murmur    History of stress test    ETT-echo (9/14):  ST depression noted on ECGs; no wall motion abnormalities at peak exercise on echocardiogram   Hypertension    VSD (ventricular septal defect)    Cardiac CTA 05/2008: Membranous VSD, 8-9 mm in diameter, mild RVE, EF 64%, calcium score 0, no CAD. Last echo 02/2011: EF 55-60%, mild CAD, perimembranous VSD slightly more prominent but no RVE or pulmonary hypertension;  Echo (9/14): Small perimembranous VSD-no significant change since 02/2011; EF 55-60%, moderate LAE    Past Surgical History:  Procedure Laterality Date   cyst left wrist     dental implants     WISDOM TOOTH EXTRACTION      There were no vitals filed for this visit.   Subjective Assessment - 07/30/20 1015     Subjective More good days than bad.  But those bad days are tough.  I stopped the dead bugs with the weight b/c it bothers my pec muscle.  Wednesdays I'm busy at the [employer].  Clams going well.  Fewer issues there.  DN went fine.  I tried a Tai Chi class but  had to leave after 25 minutes.    Patient Stated Goals I just want to get back to where I was in January.  I know I'll always have some pain.    Currently in Pain? Yes    Pain Score 3     Pain Location Abdomen    Pain Radiating Towards abdominal tightness                               [facility] Adult PT Treatment/Exercise - 07/30/20 0001       Neuro Re-ed    Neuro Re-ed Details  discussed dosing of exercise to avoid huge swings in soreness/pain; "don't burn the popcorn" analogy      Lumbar Exercises: Stretches   Other Lumbar Stretch Exercise 1/2 kneel posterior pelvic tilt, with arm elevation and sidebend reach over 3 reps each right/left      Lumbar Exercises: Standing   Other Standing Lumbar Exercises discussed Pallof press modifications secondary to delayed soreness in QL given new green band      Lumbar Exercises: Supine   Dead  Bug 5 reps    Dead Bug Limitations no weight secondary to complaints of left pectoral muscle pain    Isometric Hip Flexion 5 reps    Isometric Hip Flexion Limitations opposite hand to knee    Other Supine Lumbar Exercises attempted single knee lift to 90/90 but reports back pain so discontinued      Lumbar Exercises: Sidelying   Clam 10 reps;Both    Clam Limitations tip forward                    PT Education - 07/30/20 1106     Education Details supine hand to opposite knee push isometric; 1/2 kneel dynamic stretch    Person(s) Educated Patient    Methods Explanation;Demonstration;Handout    Comprehension Returned demonstration;Verbalized understanding              PT Short Term Goals - 07/20/20 1156       PT SHORT TERM GOAL #1   Title be independent with initial HEP    Status Achieved      PT SHORT TERM GOAL #2   Title report a 30% reduction in bil hip and back pain with sitting and standing    Status Achieved      PT SHORT TERM GOAL #3   Title Pt with improved bil hip flexibility (quads, hip flexorsand  right external rotators)   to improve walking time to 10 min    Status Achieved      PT SHORT TERM GOAL #4   Title Pt will be able to sit 10 min with pain level 4/10    Baseline some days can sit 45 min    Status Achieved      PT SHORT TERM GOAL #5   Title Lumbar extension ROM to 20 degrees for improved stride length with gait    Status Achieved               PT Long Term Goals - 07/20/20 1157       PT LONG TERM GOAL #1   Title be independent in advanced HEP    Status On-going      PT LONG TERM GOAL #3   Title Pt will be able to sit for driving, play computer games, watching TV up to 20 min with min pain    Baseline can drive 20 min    Status On-going      PT LONG TERM GOAL #4   Title The patient will have improved hip and lumbo pelvic strength to grossly 4/5 needed for standing/walking longer periods of time    Status On-going      PT LONG TERM GOAL #5   Title The patient will be able to walk 15-20 min with min pain    Status On-going                   Plan - 07/30/20 1126     Clinical Impression Statement Improved overall function reported as well as pain modulation (although still intense at times).   Added 1/2 kneeling dynamic stretch to address continued complaints of trunk muscular tightness.  Able to progress supine core strengthening to hand to knee isometric resistance.  Therapist providing cues for optimization of exercise benefit.  Pain education on exercise "dosing" to avoid the up/down swings of "overdoing it" and recovery.    Examination-Activity Limitations Locomotion Level;Sit;Carry;Squat;Lift;Stand;Stairs    Examination-Participation Restrictions Meal Prep;Cleaning;Occupation;Shop;Laundry;Community Activity    Rehab Potential Good  PT Frequency 2x / week    PT Duration 8 weeks    PT Treatment/Interventions ADLs/Self Care Home Management;Aquatic Therapy;Cryotherapy;Electrical Stimulation;Ultrasound;Traction;Moist Heat;Iontophoresis 28m/ml  Dexamethasone;Therapeutic activities;Therapeutic exercise;Neuromuscular re-education;Manual techniques;Patient/family education;Dry needling;Spinal Manipulations;Taping    PT Next Visit Plan review 1/2 knee hip/trunk stretch; review supine hand to knee push;  DN and manual therapy as needed             Patient will benefit from skilled therapeutic intervention in order to improve the following deficits and impairments:  Decreased range of motion, Difficulty walking, Increased fascial restricitons, Impaired UE functional use, Pain, Decreased activity tolerance, Impaired flexibility, Decreased strength  Visit Diagnosis: Muscle weakness (generalized)  Acute bilateral low back pain without sciatica  Cramp and spasm     Problem List Patient Active Problem List   Diagnosis Date Noted   Vitamin D deficiency 12/03/2019   Hyperlipidemia 12/03/2019   Low blood monocyte count 07/06/2016   Hypertension 05/03/2016   Low back pain 06/16/2014   Anemia due to other cause 03/24/2014   Routine general medical examination at a health care facility 03/24/2014   VSD (ventricular septal defect) 03/09/2011   URTICARIA 06/03/2008   SEBACEOUS CYST 10/09/2007   Asthma 10/31/2006   SRuben Im PT 07/30/20 11:41 AM Phone: [phone]: [phone] SAlvera Singh6/16/2022, 11:41 AM  [address] [place] [address]Phone: [phone]  Fax:  [phone] Name: KDariel PellecchiaMRN: [id number] of Birth: [birth year]

## 2020-07-30 NOTE — Patient Instructions (Signed)
Access Code: [facility] URL: [email or web address] Date: 07/30/2020 Prepared by: Lavinia Sharps  Exercises Standing Hip Flexor Stretch - 1 x daily - 7 x weekly - 1 sets - 3 reps - 10 hold Side Lunge Adductor Stretch - 1 x daily - 7 x weekly - 1 sets - 3 reps - 10 hold Standing Quadriceps Stretch - 1 x daily - 7 x weekly - 1 sets - 3 reps - 10 hold Seated Figure 4 Piriformis Stretch - 1 x daily - 7 x weekly - 3 sets - 2 reps - 30 hold Supine Figure 4 Piriformis Stretch - 1 x daily - 7 x weekly - 3 sets - 2 reps - 30 hold Sidelying Open Book Thoracic Rotation with Knee on Foam Roll - 2 x daily - 7 x weekly - 1 sets - 10 reps Prone Press Up - 2 x daily - 7 x weekly - 1 sets - 10 reps Pigeon Pose - 1 x daily - 7 x weekly - 3 sets - 10 reps [facility] with Gluteal Activation - 1 x daily - 7 x weekly - 3 sets - 10 reps Supine Bilateral Isometric Hip Flexion - 1 x daily - 7 x weekly - 3 sets - 10 reps Quadruped Adductor Stretch - 1 x daily - 7 x weekly - 1 sets - 3 reps - 20-30 hold Single Arm Doorway Pec Stretch at 120 Degrees Abduction - 1 x daily - 7 x weekly - 3 sets - 5 reps Beginner Bridge - 1 x daily - 7 x weekly - 2-3 sets - 5 reps Clamshell - 1 x daily - 7 x weekly - 1 sets - 10 reps Standing Isometric Hip Abduction with Ball on Wall - 1 x daily - 7 x weekly - 1 sets - 1-3 reps - 5 hold Hooklying Transversus Abdominis Palpation - 1 x daily - 7 x weekly - 1 sets - 5 reps Supine Transversus Abdominis Bracing with Double Leg Fallout - 1 x daily - 7 x weekly - 1 sets - 10 reps Supine Transversus Abdominis Bracing with Heel Slide - 1 x daily - 7 x weekly - 1 sets - 10 reps Supine Lower Trunk Rotation - 1 x daily - 7 x weekly - 1 sets - 2 reps - 20 hold Dead Bug - 1 x daily - 7 x weekly - 1 sets - 10 reps Standing Anti-Rotation Press with Anchored Resistance - 1 x daily - 7 x weekly - 2 sets - 10 reps Hooklying Isometric Hip Flexion - 1 x daily - 7 x weekly - 1 sets - 5 reps - 5  hold Half Kneeling Hip Flexor Stretch with Sidebend - 1 x daily - 7 x weekly - 3 sets - 3 reps

## 2020-08-06 ENCOUNTER — Other Ambulatory Visit: Payer: Self-pay

## 2020-08-06 ENCOUNTER — Ambulatory Visit: Payer: BC Managed Care – PPO | Admitting: Physical Therapy

## 2020-08-06 DIAGNOSIS — M545 Low back pain, unspecified: Secondary | ICD-10-CM

## 2020-08-06 DIAGNOSIS — R252 Cramp and spasm: Secondary | ICD-10-CM

## 2020-08-06 DIAGNOSIS — M6281 Muscle weakness (generalized): Secondary | ICD-10-CM

## 2020-08-06 NOTE — Therapy (Signed)
[facility] [address], [address] [place], [place], [address] Phone: [phone]   Fax:  [phone]  Physical Therapy Treatment  Patient Details  Name: Elizabeth Lozano MRN: [id number] Date of Birth: Jun 30, 1970 Referring Provider (PT): Dr Philip Aspen   Encounter Date: 08/06/2020   PT End of Session - 08/06/20 2252     Visit Number 11    Number of Visits 24    Date for PT Re-Evaluation 08/11/20    Authorization Type [employer] 30 visit limit/6 used    Authorization - Number of Visits 24    PT Start Time 0930    PT Stop Time 1015    PT Time Calculation (min) 45 min    Activity Tolerance Patient tolerated treatment well             Past Medical History:  Diagnosis Date   Allergy    she has seen Dr. Eileen Stanford    Anemia    long term issues per pt   Anxiety    due to back pain    Asthma    Depression    Heart murmur    History of stress test    ETT-echo (9/14):  ST depression noted on ECGs; no wall motion abnormalities at peak exercise on echocardiogram   Hypertension    VSD (ventricular septal defect)    Cardiac CTA 05/2008: Membranous VSD, 8-9 mm in diameter, mild RVE, EF 64%, calcium score 0, no CAD. Last echo 02/2011: EF 55-60%, mild CAD, perimembranous VSD slightly more prominent but no RVE or pulmonary hypertension;  Echo (9/14): Small perimembranous VSD-no significant change since 02/2011; EF 55-60%, moderate LAE    Past Surgical History:  Procedure Laterality Date   cyst left wrist     dental implants     WISDOM TOOTH EXTRACTION      There were no vitals filed for this visit.   Subjective Assessment - 08/06/20 0931     Subjective I'm not good.  I tried swimming on Sunday and that triggered left > right hip flexors/abdominals.   I laid down for 11 hours to rest it but that didn't help.   I put ice/heat on yesterday and that helped.  It attaches to the left quads.  I'm supposed to go back to work limited hours.     Currently in Pain? Yes    Pain Score 6     Pain Location Hip    Pain Orientation Left;Right    Pain Type Chronic pain    Pain Radiating Towards lower abdominal region left worse than right                               [facility] Adult PT Treatment/Exercise - 08/06/20 0001       Self-Care   Other Self-Care Comments  pt plans on returning to work about 1 hour a day; plans to break it up for 30 min but concerned that this even bothers; discussed 60 sec standing breaks every 10 min; trial of sitting on elevated chair height      Lumbar Exercises: Stretches   Other Lumbar Stretch Exercise verbal review of 1/2 kneel hip flexor stretch for key tips including post pelvic tilt      Lumbar Exercises: Supine   Ab Set 5 reps    Dead Bug 5 reps    Dead Bug Limitations while holding isometric hand to knee isometric on  opp side    Isometric Hip Flexion 5 reps    Isometric Hip Flexion Limitations opposite hand to knee    Other Supine Lumbar Exercises attempted low load assisted leg lift with green and blue bands with resisted push downs 10x right /left but pt states this is still painful on hip flexors      Knee/Hip Exercises: Sidelying   Other Sidelying Knee/Hip Exercises attempted left assist hip abduction 7x but discontinued secondary to inc pain      Knee/Hip Exercises: Prone   Other Prone Exercises discussed prone over 1-2 pillows for hip extension (activation of opposing muscle groups)      Manual Therapy   Kinesiotex Facilitate Muscle;Inhibit Muscle      Kinesiotix   Facilitate Muscle  horizontal strip on lower abs; vertical strips along right/left hip flexors                      PT Short Term Goals - 07/20/20 1156       PT SHORT TERM GOAL #1   Title be independent with initial HEP    Status Achieved      PT SHORT TERM GOAL #2   Title report a 30% reduction in bil hip and back pain with sitting and standing    Status Achieved      PT SHORT  TERM GOAL #3   Title Pt with improved bil hip flexibility (quads, hip flexorsand right external rotators)   to improve walking time to 10 min    Status Achieved      PT SHORT TERM GOAL #4   Title Pt will be able to sit 10 min with pain level 4/10    Baseline some days can sit 45 min    Status Achieved      PT SHORT TERM GOAL #5   Title Lumbar extension ROM to 20 degrees for improved stride length with gait    Status Achieved               PT Long Term Goals - 07/20/20 1157       PT LONG TERM GOAL #1   Title be independent in advanced HEP    Status On-going      PT LONG TERM GOAL #3   Title Pt will be able to sit for driving, play computer games, watching TV up to 20 min with min pain    Baseline can drive 20 min    Status On-going      PT LONG TERM GOAL #4   Title The patient will have improved hip and lumbo pelvic strength to grossly 4/5 needed for standing/walking longer periods of time    Status On-going      PT LONG TERM GOAL #5   Title The patient will be able to walk 15-20 min with min pain    Status On-going                   Plan - 08/06/20 2253     Clinical Impression Statement After a few relatively good weeks with improved mobility, function and pain intensity levels, the patient presents with complaints of exacerbated left > right hip flexor/lower abdominal region pain as the result of swimming for a short duration over the weekend.  She reports she rested it by lying down for 11 hours but that did not provide relief.  She has also tried heat/cold at home.  Discussed the principle of repetitive low load  assisted ex's and /or ROM of opposing muscle groups (hip extensors) as a pain relieving strategy although her tolerance for any ex today was low.  Applied kinesiotape to lower abdominals for facilitation and bil hip flexor strips for muscle deactivation.  Therapist continually modifying treatment plan based on symptom response.  She states she has been  talking to her boss about return to work from home for short periods of time/day and we discussed strategies for this as well.    Comorbidities chronic history; multi regions affected;  Depression; Asthma; HTN    Examination-Activity Limitations Locomotion Level;Sit;Carry;Squat;Lift;Stand;Stairs    Examination-Participation Restrictions Meal Prep;Cleaning;Occupation;Shop;Laundry;Community Activity    Rehab Potential Good    PT Frequency 2x / week    PT Duration 8 weeks    PT Treatment/Interventions ADLs/Self Care Home Management;Aquatic Therapy;Cryotherapy;Electrical Stimulation;Ultrasound;Traction;Moist Heat;Iontophoresis 4mg /ml Dexamethasone;Therapeutic activities;Therapeutic exercise;Neuromuscular re-education;Manual techniques;Patient/family education;Dry needling;Spinal Manipulations;Taping    PT Next Visit Plan assess response to taping; check objective measures, FOTO; progress toward goals next visit to determine future plan             Patient will benefit from skilled therapeutic intervention in order to improve the following deficits and impairments:  Decreased range of motion, Difficulty walking, Increased fascial restricitons, Impaired UE functional use, Pain, Decreased activity tolerance, Impaired flexibility, Decreased strength  Visit Diagnosis: Muscle weakness (generalized)  Acute bilateral low back pain without sciatica  Cramp and spasm     Problem List Patient Active Problem List   Diagnosis Date Noted   Vitamin D deficiency 12/03/2019   Hyperlipidemia 12/03/2019   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, [address] [place], [place], [place] Phone: [phone]   Fax:  [phone]  Name: Elizabeth Lozano MRN: [name] Date of Birth: 1970/07/19

## 2020-08-11 ENCOUNTER — Ambulatory Visit: Payer: BC Managed Care – PPO | Admitting: Physical Therapy

## 2020-08-11 ENCOUNTER — Other Ambulatory Visit: Payer: Self-pay

## 2020-08-11 ENCOUNTER — Encounter: Payer: Self-pay | Admitting: Physical Therapy

## 2020-08-11 DIAGNOSIS — M6281 Muscle weakness (generalized): Secondary | ICD-10-CM

## 2020-08-11 DIAGNOSIS — M545 Low back pain, unspecified: Secondary | ICD-10-CM | POA: Diagnosis not present

## 2020-08-11 DIAGNOSIS — R252 Cramp and spasm: Secondary | ICD-10-CM

## 2020-08-11 NOTE — Therapy (Signed)
[facility] [address], [place], [place], [address] Phone: [phone]   Fax:  [phone]  Physical Therapy Treatment  Patient Details  Name: Kassondra Geil MRN: [id number] Date of Birth: 04-26-1970 Referring Provider (PT): Dr Isaac Bliss   Encounter Date: 08/11/2020   PT End of Session - 08/11/20 0932     Visit Number 12    Date for PT Re-Evaluation 08/11/20    Authorization Type [employer] 30 visit limit/6 used    Authorization - Visit Number 12    Authorization - Number of Visits 24    PT Start Time 0932    PT Stop Time 1012    PT Time Calculation (min) 40 min    Activity Tolerance Patient tolerated treatment well    Behavior During Therapy [facility] for tasks assessed/performed             Past Medical History:  Diagnosis Date   Allergy    she has seen Dr. Tiajuana Amass    Anemia    long term issues per pt   Anxiety    due to back pain    Asthma    Depression    Heart murmur    History of stress test    ETT-echo (9/14):  ST depression noted on ECGs; no wall motion abnormalities at peak exercise on echocardiogram   Hypertension    VSD (ventricular septal defect)    Cardiac CTA 05/2008: Membranous VSD, 8-9 mm in diameter, mild RVE, EF 64%, calcium score 0, no CAD. Last echo 02/2011: EF 55-60%, mild CAD, perimembranous VSD slightly more prominent but no RVE or pulmonary hypertension;  Echo (9/14): Small perimembranous VSD-no significant change since 02/2011; EF 55-60%, moderate LAE    Past Surgical History:  Procedure Laterality Date   cyst left wrist     dental implants     WISDOM TOOTH EXTRACTION      There were no vitals filed for this visit.   Subjective Assessment - 08/11/20 0933     Subjective I had spreading pain and spasm up to my shoulder after last session and needed muscle relaxers and got a massage.  It hurt to breathe, sneeze and cough.  Both of my QLs and paraspinals are very tight.  I haven't  been able to sleep.  I did have a good day yesterday so then I think I overdid it.    Limitations House hold activities;Walking;Sitting    How long can you sit comfortably? can sit up to 45 min now depending on the day    How long can you walk comfortably? not at all.    Diagnostic tests none    Patient Stated Goals I just want to get back to where I was in January.  I know I'll always have some pain.    Currently in Pain? Yes    Pain Score 6     Pain Location Back    Pain Orientation Right;Left;Upper;Mid;Lower    Pain Descriptors / Indicators Aching;Tightness;Sharp    Pain Type Chronic pain    Pain Radiating Towards obliques, QL    Pain Onset More than a month ago    Pain Frequency Intermittent                [facility] PT Assessment - 08/11/20 0001       Assessment   Medical Diagnosis left sided low back pain    Referring Provider (PT) Dr Isaac Bliss    Onset Date/Surgical  Date --   > 6 months   Next MD Visit tomorrow    Prior Therapy Fall/winter      Observation/Other Assessments   Focus on Therapeutic Outcomes (FOTO)  56%      Strength   Overall Strength Comments bil LEs 4+/5 bil      Palpation   Palpation comment Lt QL TP present                           [facility] Adult PT Treatment/Exercise - 08/11/20 0001       Self-Care   Self-Care Other Self-Care Comments    Other Self-Care Comments  discussion of need for ongoing symptom management through activity modification, breathwork/meditation, stretch/lengthen stretches from HEP, new activity intro in small doses, recommend return to MD for new onset of Lt upper quadrant pain in ribcage if continues      Shoulder Exercises: Standing   Other Standing Exercises Lt pectoral doorway stretch, bil pectoral stretch    Other Standing Exercises review of lateral costal expansion breathwork      Manual Therapy   Manual Therapy Soft tissue mobilization    Soft tissue mobilization Lt QL, obliques and  pectorals              Trigger Point Dry Needling - 08/11/20 0001     Consent Given? Yes    Education Handout Provided Previously provided    Muscles Treated Back/Hip Quadratus lumborum    Dry Needling Comments Lt only    Quadratus Lumborum Response Twitch response elicited;Palpable increased muscle length                    PT Short Term Goals - 08/11/20 0937       PT SHORT TERM GOAL #1   Title be independent with initial HEP    Status Achieved      PT SHORT TERM GOAL #2   Title report a 30% reduction in bil hip and back pain with sitting and standing    Status Achieved      PT SHORT TERM GOAL #3   Title Pt with improved bil hip flexibility (quads, hip flexorsand right external rotators)   to improve walking time to 10 min    Status Achieved      PT SHORT TERM GOAL #4   Title Pt will be able to sit 10 min with pain level 4/10    Status Achieved      PT SHORT TERM GOAL #5   Title Lumbar extension ROM to 20 degrees for improved stride length with gait    Status Achieved               PT Long Term Goals - 08/11/20 0937       PT LONG TERM GOAL #1   Title be independent in advanced HEP    Status Achieved      PT LONG TERM GOAL #2   Title Improve FOTO score from 48% to 65%    Baseline 56%    Status Partially Met      PT LONG TERM GOAL #3   Title Pt will be able to sit for driving, play computer games, watching TV up to 20 min with min pain    Baseline 20 min    Status Achieved      PT LONG TERM GOAL #4   Title The patient will have improved hip and lumbo pelvic strength to  grossly 4/5 needed for standing/walking longer periods of time    Baseline 4+/5    Status Achieved      PT LONG TERM GOAL #5   Title The patient will be able to walk 15-20 min with min pain    Status Achieved                   Plan - 08/11/20 1248     Clinical Impression Statement Pt had recent flare up of diffuse pain along both sides of back into Lt  intrascapular region last week following swimming.  Pt reports pain was not relieved with last PT session.   Pt did report that she had a better day yesterday but overdid it with HEP.  Pt did meet most LTGs today on assessment, with FOTO score reaching 56% from 43% and report of improved sitting and walking tolerance of 20' each.  Pt has been educated on pain management strategies, pain neuroscience, and use of HEP to address soft tissue restrictions.  PT reiterated importance of use of self-management strategies and ongoing cognitive-behavioral therapy to target pain management from all angles.  Pt continues to have tightness of soft tissues along anterior, lateral and posterior trunk for which Pt has received HEP stretches and breathwork to address.  PT encouraged Pt to return to MD if her new onset of pain in Lt intrascapular region continues given it differs from current POC focus.  Session spent today reviewing progress and targeting Lt QL tension/TP with DN and manual therapy. Given most goals met and Pt educated on HEP to manage remaining pain for which Pt was referred, PT determined readiness for d/c at this time.    Comorbidities chronic history; multi regions affected;  Depression; Asthma; HTN    PT Frequency 2x / week    PT Duration 8 weeks    PT Treatment/Interventions ADLs/Self Care Home Management;Aquatic Therapy;Cryotherapy;Electrical Stimulation;Ultrasound;Traction;Moist Heat;Iontophoresis 4mg /ml Dexamethasone;Therapeutic activities;Therapeutic exercise;Neuromuscular re-education;Manual techniques;Patient/family education;Dry needling;Spinal Manipulations;Taping    PT Next Visit Plan d/c to HEP with return to MD if new onset of pain continues in Lt intrascapular region    PT Home Exercise Plan HEP for stretching, breathwork/meditation, light core cueing    Consulted and Agree with Plan of Care Patient             Patient will benefit from skilled therapeutic intervention in order to  improve the following deficits and impairments:     Visit Diagnosis: Muscle weakness (generalized)  Acute bilateral low back pain without sciatica  Cramp and spasm     Problem List Patient Active Problem List   Diagnosis Date Noted   Vitamin D deficiency 12/03/2019   Hyperlipidemia 12/03/2019   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 Patient goals were partially met. Patient is being discharged due to maximized rehab potential.   Baruch Merl, PT 08/11/20 12:59 PM    [facility] [address], [address] [place],  [place], [address] Phone: [phone]   Fax:  [phone]  Name: Lillien Petronio MRN: [id number] Date of Birth: 1971/01/09

## 2020-08-18 ENCOUNTER — Other Ambulatory Visit: Payer: Self-pay | Admitting: Internal Medicine

## 2020-08-18 DIAGNOSIS — F339 Major depressive disorder, recurrent, unspecified: Secondary | ICD-10-CM

## 2020-08-28 ENCOUNTER — Encounter: Payer: Self-pay | Admitting: Internal Medicine

## 2020-08-28 DIAGNOSIS — M545 Low back pain, unspecified: Secondary | ICD-10-CM

## 2020-08-28 DIAGNOSIS — G8929 Other chronic pain: Secondary | ICD-10-CM

## 2020-09-02 ENCOUNTER — Ambulatory Visit (INDEPENDENT_AMBULATORY_CARE_PROVIDER_SITE_OTHER): Payer: BC Managed Care – PPO | Admitting: Internal Medicine

## 2020-09-02 ENCOUNTER — Other Ambulatory Visit: Payer: Self-pay

## 2020-09-02 ENCOUNTER — Encounter: Payer: Self-pay | Admitting: Internal Medicine

## 2020-09-02 ENCOUNTER — Other Ambulatory Visit: Payer: Self-pay | Admitting: Internal Medicine

## 2020-09-02 VITALS — BP 120/86 | HR 82 | Temp 98.2°F | Ht 66.0 in | Wt 155.8 lb

## 2020-09-02 DIAGNOSIS — F339 Major depressive disorder, recurrent, unspecified: Secondary | ICD-10-CM

## 2020-09-02 DIAGNOSIS — M545 Low back pain, unspecified: Secondary | ICD-10-CM

## 2020-09-02 MED ORDER — CYCLOBENZAPRINE HCL 5 MG PO TABS: 5.0000 mg | ORAL_TABLET | Freq: Three times a day (TID) | ORAL | 1 refills | Status: DC | PRN

## 2020-09-02 MED ORDER — BUPROPION HCL ER (XL) 150 MG PO TB24: 150.0000 mg | ORAL_TABLET | Freq: Every day | ORAL | 1 refills | Status: DC

## 2020-09-02 NOTE — Progress Notes (Signed)
Established Patient Office Visit     This visit occurred during the SARS-CoV-2 public health emergency.  Safety protocols were in place, including screening questions prior to the visit, additional usage of staff PPE, and extensive cleaning of exam room while observing appropriate contact time as indicated for disinfecting solutions.    CC/Reason for Visit: Follow-up depression  HPI: Elizabeth Lozano is a 50 y.o. female who is coming in today for the above mentioned reasons. I last saw her beginning of May.  She was very depressed and we had started her on Zoloft.  She only took it for about 2 to 3 days before discontinuing due to what she perceived as potential side effects of nausea and hand tremors.  She continues to attend CBT sessions.  She would like to try a different antidepressant.  She has no suicidal intent or ideation.   Past Medical/Surgical History: Past Medical History:  Diagnosis Date   Allergy    she has seen Dr. Eileen Stanford    Anemia    long term issues per pt   Anxiety    due to back pain    Asthma    Depression    Heart murmur    History of stress test    ETT-echo (9/14):  ST depression noted on ECGs; no wall motion abnormalities at peak exercise on echocardiogram   Hypertension    VSD (ventricular septal defect)    Cardiac CTA 05/2008: Membranous VSD, 8-9 mm in diameter, mild RVE, EF 64%, calcium score 0, no CAD. Last echo 02/2011: EF 55-60%, mild CAD, perimembranous VSD slightly more prominent but no RVE or pulmonary hypertension;  Echo (9/14): Small perimembranous VSD-no significant change since 02/2011; EF 55-60%, moderate LAE    Past Surgical History:  Procedure Laterality Date   cyst left wrist     dental implants     WISDOM TOOTH EXTRACTION      Social History:  reports that she has never smoked. She has never used smokeless tobacco. She reports current alcohol use of about 4.0 standard drinks of alcohol per week. She reports that she does not use  drugs.  Allergies: Allergies  Allergen Reactions   Erythromycin Nausea And Vomiting   Iohexol Hives     Code: HIVES, Desc: RN AMY NOTICED HIVE ON STOMACH AFTER HEART SCAN. ALSO ONE ON BACK. NOT ITCHING UNTIL AFTER SHE KNEW OF HIVES. DR.NISHAN NOTIFIED. GIVEN BENADRYL., Onset Date: [id number]    Tetracycline Hives    Family History:  Family History  Problem Relation Age of Onset   Sarcoidosis Mother    Colon polyps Mother    Heart attack Father    Hypertension Father    Hyperlipidemia Father    Melanoma Father    Cancer Father    Healthy Brother    Colon cancer Paternal Grandmother    Crohn's disease Neg Hx    Esophageal cancer Neg Hx    Rectal cancer Neg Hx    Stomach cancer Neg Hx      Current Outpatient Medications:    buPROPion (WELLBUTRIN XL) 150 MG 24 hr tablet, Take 1 tablet (150 mg total) by mouth daily., Disp: 90 tablet, Rfl: 1   cetirizine (ZYRTEC) 10 MG tablet, Take 10 mg by mouth daily., Disp: , Rfl:    Cholecalciferol (VITAMIN D3 PO), Take by mouth., Disp: , Rfl:    cyclobenzaprine (FLEXERIL) 5 MG tablet, Take 1 tablet (5 mg total) by mouth 3 (three) times daily as needed  for muscle spasms., Disp: 30 tablet, Rfl: 1   MAGNESIUM PO, Take 200 mg by mouth daily. , Disp: , Rfl:    Multiple Vitamin (MULTIVITAMIN) tablet, Take 1 tablet by mouth daily. Centrum Mini's for Women 50 Plus, Disp: , Rfl:    norethindrone (MICRONOR) 0.35 MG tablet, Take 1 tablet by mouth daily., Disp: , Rfl:   Review of Systems:  Constitutional: Denies fever, chills, diaphoresis, appetite change and fatigue.  HEENT: Denies photophobia, eye pain, redness, hearing loss, ear pain, congestion, sore throat, rhinorrhea, sneezing, mouth sores, trouble swallowing, neck pain, neck stiffness and tinnitus.   Respiratory: Denies SOB, DOE, cough, chest tightness,  and wheezing.   Cardiovascular: Denies chest pain, palpitations and leg swelling.  Gastrointestinal: Denies nausea, vomiting, abdominal pain,  diarrhea, constipation, blood in stool and abdominal distention.  Genitourinary: Denies dysuria, urgency, frequency, hematuria, flank pain and difficulty urinating.  Endocrine: Denies: hot or cold intolerance, sweats, changes in hair or nails, polyuria, polydipsia. Musculoskeletal: Denies myalgias, back pain, joint swelling, arthralgias and gait problem.  Skin: Denies pallor, rash and wound.  Neurological: Denies dizziness, seizures, syncope, weakness, light-headedness, numbness and headaches.  Hematological: Denies adenopathy. Easy bruising, personal or family bleeding history  Psychiatric/Behavioral: Denies suicidal ideation, confusion, nervousness and agitation    Physical Exam: Vitals:   09/02/20 1401  BP: 120/86  Pulse: 82  Temp: 98.2 F (36.8 C)  TempSrc: Oral  SpO2: 97%  Weight: 155 lb 12.8 oz (70.7 kg)  Height: 5\' 6"  (1.676 m)    Body mass index is 25.15 kg/m.   Constitutional: NAD, calm, comfortable Eyes: PERRL, lids and conjunctivae normal ENMT: Mucous membranes are moist.  Respiratory: clear to auscultation bilaterally, no wheezing, no crackles. Normal respiratory effort. No accessory muscle use.  Cardiovascular: Regular rate and rhythm, strong systolic murmur, no/ rubs / gallops. No extremity edema.  Neurologic: Grossly intact and nonfocal Psychiatric: Normal judgment and insight. Alert and oriented x 3. Normal mood.    Impression and Plan:  Depression, recurrent (HCC)  - Plan: buPROPion (WELLBUTRIN XL) 150 MG 24 hr tablet  -Continues to be very depressed, try Wellbutrin 150 mg daily, she will follow-up in 8 to 12 weeks.  Time spent: 31 minutes reviewing chart, interviewing and examining patient and formulating plan of care.       , MD [facility]

## 2020-09-08 ENCOUNTER — Encounter: Payer: Self-pay | Admitting: Internal Medicine

## 2020-09-11 ENCOUNTER — Encounter: Payer: Self-pay | Admitting: Internal Medicine

## 2020-09-16 ENCOUNTER — Encounter: Payer: Self-pay | Admitting: Internal Medicine

## 2020-09-16 DIAGNOSIS — F339 Major depressive disorder, recurrent, unspecified: Secondary | ICD-10-CM

## 2020-09-16 DIAGNOSIS — G4700 Insomnia, unspecified: Secondary | ICD-10-CM

## 2020-09-18 ENCOUNTER — Ambulatory Visit: Payer: BC Managed Care – PPO | Admitting: Physical Therapy

## 2020-09-21 ENCOUNTER — Encounter: Payer: Self-pay | Admitting: Internal Medicine

## 2020-10-23 ENCOUNTER — Encounter: Payer: Self-pay | Admitting: Internal Medicine

## 2020-10-23 DIAGNOSIS — M545 Low back pain, unspecified: Secondary | ICD-10-CM

## 2020-11-03 ENCOUNTER — Ambulatory Visit: Payer: BC Managed Care – PPO | Admitting: Physical Therapy

## 2020-11-12 ENCOUNTER — Encounter: Payer: Self-pay | Admitting: Physical Therapy

## 2020-11-12 ENCOUNTER — Other Ambulatory Visit: Payer: Self-pay

## 2020-11-12 ENCOUNTER — Ambulatory Visit: Payer: BC Managed Care – PPO | Attending: Internal Medicine | Admitting: Physical Therapy

## 2020-11-12 DIAGNOSIS — M545 Low back pain, unspecified: Secondary | ICD-10-CM | POA: Insufficient documentation

## 2020-11-12 DIAGNOSIS — M6281 Muscle weakness (generalized): Secondary | ICD-10-CM | POA: Insufficient documentation

## 2020-11-12 DIAGNOSIS — R252 Cramp and spasm: Secondary | ICD-10-CM | POA: Insufficient documentation

## 2020-11-12 NOTE — Therapy (Signed)
[facility] [address] [place], [place], [address] Phone: [phone]   Fax:  [phone]  Physical Therapy Evaluation  Patient Details  Name: Elizabeth Lozano MRN: [id number] Date of Birth: 12-Dec-1970 Referring Provider (PT): Dr Philip Aspen   Encounter Date: 11/12/2020   PT End of Session - 11/12/20 1538     Visit Number 1    Number of Visits 10    Date for PT Re-Evaluation 01/21/21    Authorization Type [employer] 30 visit limit:   10 visits remaining    Authorization - Visit Number 1    Authorization - Number of Visits 10    PT Start Time 1017    PT Stop Time 1055    PT Time Calculation (min) 38 min    Activity Tolerance Patient limited by pain             Past Medical History:  Diagnosis Date   Allergy    she has seen Dr. Eileen Stanford    Anemia    long term issues per pt   Anxiety    due to back pain    Asthma    Depression    Heart murmur    History of stress test    ETT-echo (9/14):  ST depression noted on ECGs; no wall motion abnormalities at peak exercise on echocardiogram   Hypertension    VSD (ventricular septal defect)    Cardiac CTA 05/2008: Membranous VSD, 8-9 mm in diameter, mild RVE, EF 64%, calcium score 0, no CAD. Last echo 02/2011: EF 55-60%, mild CAD, perimembranous VSD slightly more prominent but no RVE or pulmonary hypertension;  Echo (9/14): Small perimembranous VSD-no significant change since 02/2011; EF 55-60%, moderate LAE    Past Surgical History:  Procedure Laterality Date   cyst left wrist     dental implants     WISDOM TOOTH EXTRACTION      There were no vitals filed for this visit.    Subjective Assessment - 11/12/20 1022     Subjective 2 1/2 months ago sustained bent over dealing with cicada bugs in the yard.  Aggravated QL and erector spinae.  Spasms.    Only able to do short 5 min trips.  This is the farthest I've traveled in 3 months (15 minutes).  "I've been worrying  about coming here for several days"    How long can you sit comfortably? can sit up to 45 min now depending on the day    How long can you walk comfortably? 15 min walk outside but aggravates hips    Patient Stated Goals I want some basic back exercises to handle QL, erector spinae    Currently in Pain? Yes    Pain Score 2     Pain Location Back    Pain Orientation Right;Left;Lower   more to left   Pain Onset More than a month ago    Aggravating Factors  bending over, sitting, prone, bending to brush teeth; bridges; tried side bridges with weights; quadruped    Pain Relieving Factors supine; sidelying                [facility] PT Assessment - 11/12/20 0001       Assessment   Medical Diagnosis left sided low back pain    Referring Provider (PT) Dr Philip Aspen    Prior Therapy discharged end of June      Precautions   Precautions None  Restrictions   Weight Bearing Restrictions No      Balance Screen   Has the patient fallen in the past 6 months No    Has the patient had a decrease in activity level because of a fear of falling?  No    Is the patient reluctant to leave their home because of a fear of falling?  No      Home Tourist information centre manager residence      Prior Function   Level of Independence Independent with basic ADLs   I lie down mostly   Vocation Other (comment)   currently not working   Leisure TV watching; computer games      Observation/Other Assessments   Focus on Therapeutic Outcomes (FOTO)  49%      Posture/Postural Control   Posture/Postural Control No significant limitations      AROM   Overall AROM Comments pt immediately requests to lie supine secondary to pain/fearfulness of pain exacerbation; formal ROM not performed but gross motor assessment of KTC, lumbar rotation, HS length WFLS      Strength   Overall Strength Comments formal testing not performed but expect deficiencies in posterior chain musculature: lats, lumbar  multifidi, gluteals 4/5      Special Tests   Lumbar Tests Straight Leg Raise      Straight Leg Raise   Findings Negative                        Objective measurements completed on examination: See above findings.                PT Education - 11/12/20 1537     Education Details supine decompression series; supine QL lengthening;  pt requests "strengthening" when lying down: supine blue band LE press downs and UE pull downs    Person(s) Educated Patient    Methods Demonstration;Explanation;Handout    Comprehension Returned demonstration;Verbalized understanding              PT Short Term Goals - 11/12/20 1557       PT SHORT TERM GOAL #1   Title be independent with initial HEP    Time 5    Period Weeks    Status New    Target Date 12/17/20      PT SHORT TERM GOAL #2   Title report a 30% reduction in bil back pain with sitting and standing    Time 5    Period Weeks    Status New      PT SHORT TERM GOAL #3   Title The patient will be able to sit/drive 70-26 minutes with pain level 5/10 or less    Time 5    Period Weeks    Status New      PT SHORT TERM GOAL #4   Title Patient will demonstrate hip hinge to avoid back exacerbation while brushing her teeth    Time 5    Period Weeks    Status New      PT SHORT TERM GOAL #5   Title .Marland KitchenMarland Kitchen               PT Long Term Goals - 11/12/20 1600       PT LONG TERM GOAL #1   Title be independent in advanced HEP    Time 10    Period Weeks    Status New    Target Date 01/21/21  PT LONG TERM GOAL #2   Title Improve FOTO score from 49% to 62%    Time 10    Period Weeks    Status New      PT LONG TERM GOAL #3   Title Pt will be able to sit for driving, play computer games, watching TV up to 25 min with min pain    Time 10    Period Weeks    Status New      PT LONG TERM GOAL #4   Title The patient will have improved hip and lumbo pelvic strength to grossly 4+/5 needed for  standing/walking longer periods of time    Time 10    Period Weeks    Status New      PT LONG TERM GOAL #5   Title The patient will be able to walk 20 min with min pain    Time 10    Period Weeks    Status New                    Plan - 11/12/20 1350     Clinical Impression Statement The patient is well known to this therapist and clinic for prior episodes of care to address LBP and hip pain/tightness.  Her last course of therapy ended 08/11/20.  She returns with complaints of bil LBP which she attributes to sustained bent over positioning 2 1/2 months ago looking for cicada bug nests. She reports she has been in acute spasm of her erector spinae and quadratus lumborum muscles limiting her ability to sit/drive.  She states the 15 minutes to come here today is the farthest she's been in 3 months.  She has limited sitting tolerance and upon entering the treatment room, she requests to lie supine.  She also reports worsening of symptoms with bridging, bending over, using weights, lying prone and quadruped.  She has put a hold on working now but has a standing desk when she feels able to return.  In supine, she is able to tolerate limited repetition mobility ex's with focus on elongation of shortened musculature which she is able to perform without exacerbation.  She requests "strengthening" exercises which I was reluctant to initiate given her history of high pain sensitivity but she agreed to limited reps and perform in the supine position.  She would benefit from graded exposure to exercise to improve function and quality of life.  We discussed benefits of aquatic PT which she will consider.    Personal Factors and Comorbidities Comorbidity 1;Comorbidity 2;Comorbidity 3+    Comorbidities chronic history; multi regions affected;  Depression; Asthma; HTN    Examination-Participation Restrictions Meal Prep;Cleaning;Occupation;Shop;Laundry;Community Activity    Stability/Clinical Decision  Making Evolving/Moderate complexity    Clinical Decision Making Moderate    Rehab Potential Good    PT Frequency 1x / week    PT Duration 8 weeks   10 weeks   PT Treatment/Interventions ADLs/Self Care Home Management;Aquatic Therapy;Cryotherapy;Electrical Stimulation;Ultrasound;Traction;Moist Heat;Iontophoresis 4mg /ml Dexamethasone;Therapeutic activities;Therapeutic exercise;Neuromuscular re-education;Manual techniques;Patient/family education;Dry needling;Spinal Manipulations;Taping    PT Next Visit Plan low intensity grade exposure to exercise with limited reps in supine initially;  high pain sensitivity;  elongation of lumbar musculature and trunk extensor muscle activation and strengthening as tolerated; pt to consider aquatic PT    PT Home Exercise Plan    Consulted and Agree with Plan of Care Patient             Patient will benefit from skilled therapeutic  intervention in order to improve the following deficits and impairments:  Decreased range of motion, Difficulty walking, Increased fascial restricitons, Impaired UE functional use, Pain, Decreased activity tolerance, Impaired flexibility, Decreased strength  Visit Diagnosis: Muscle weakness (generalized) - Plan: PT plan of care cert/re-cert  Acute bilateral low back pain without sciatica - Plan: PT plan of care cert/re-cert  Cramp and spasm - Plan: PT plan of care cert/re-cert     Problem List Patient Active Problem List   Diagnosis Date Noted   Vitamin D deficiency 12/03/2019   Hyperlipidemia 12/03/2019   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 [place], [place], [address] Phone: [phone]   Fax:  [phone]  Name: Elizabeth Lozano MRN: [id number] Date of Birth: 02-07-1971

## 2020-11-12 NOTE — Patient Instructions (Addendum)
  Access Code: [email or web address] URL: [email or web address] Date: 11/12/2020 Prepared by: Lavinia Sharps  Exercises Supine Quadratus Lumborum Stretch - 1 x daily - 7 x weekly - 1 sets - 2-3 reps Supine Eccentric Hip Extension with Resistance - 1 x daily - 7 x weekly - 1 sets - 5 reps Supine 90/90 Shoulder Extension with Resistance - 1 x daily - 7 x weekly - 1 sets - 5 reps  RE-ALIGNMENT ROUTINE EXERCISES BASIC FOR POSTURAL CORRECTION   RE-ALIGNMENT Tips BENEFITS: 1.It helps to re-align the curves of the back and improve standing posture. 2.It allows the back muscles to rest and strengthen in preparation for more activity. FREQUENCY: Daily, even after weeks, months and years of more advanced exercises. START: 1.All exercises start in the same position: lying on the back, arms resting on the supporting surface, palms up and slightly away from the body, backs of hands down, knees bent, feet flat. 2.The head, neck, arms, and legs are supported according to specific instructions of your therapist. Copyright  [employer]. All rights reserved.    1. Decompression Exercise: Basic.   Takes compression off the vertebral bodies; increases tolerance for lying on the back; helps relieve back pain   Lie on back on firm surface, knees bent, feet flat, arms turned up, out to sides (~35 degrees). Head neck and arms supported as necessary. Time _5-15__ minutes. Surface: floor     2. Shoulder Press  Strengthens upper back extensors and scapular retractors.   Press both shoulders down. Hold _2-3__ seconds. Repeat _3-5__ times. Surface: floor        3. Head Press With Chin Tuck  Strengthens neck extensors   Tuck chin SLIGHTLY toward chest, keep mouth closed. Feel weight on back of head. Increase weight by pressing head down. Hold _2-3__ seconds. Relax. Repeat 3-5___ times. Surface: floor     4. Leg Lengthener: stretches quadratus lumborum and hip flexors.  Strengthens quads and ankle  dorsiflexors.  Leg Lengthener: Full    Straighten one leg. Pull toes AND forefoot toward knee, extend heel. Lengthen leg by pulling pelvis away from ribs. Hold __5_ seconds. Relax. Repeat 1 time. Re-bend knee. Do other leg. Each leg __5_ times. Surface: floor    Leg Lengthener / Leg Press Combo: Single Leg    Straighten one leg down to floor. Pull toes AND forefoot toward knee; extend heel. Lengthen leg by pulling pelvis away from ribs. Press leg down. DO NOT BEND KNEE. Hold _5__ seconds. Relax leg. Repeat exercise 1 time. Relax leg. Re-bend knee. Repeat with other leg. Do 5 times   WHOLE LEG PRESS DOWN "LIKE PRESSING YOUR LEG DOWN INTO THE SAND"   5X   ARMS OVERHEAD STRETCH THINK "GROWING TALLER"  3X   Lavinia Sharps, PT 11/12/20 10:46 AM Phone: [phone] Fax: [phone]

## 2020-11-18 ENCOUNTER — Other Ambulatory Visit: Payer: Self-pay

## 2020-11-18 ENCOUNTER — Ambulatory Visit: Payer: BC Managed Care – PPO | Attending: Internal Medicine | Admitting: Physical Therapy

## 2020-11-18 ENCOUNTER — Encounter: Payer: Self-pay | Admitting: Physical Therapy

## 2020-11-18 DIAGNOSIS — M6281 Muscle weakness (generalized): Secondary | ICD-10-CM | POA: Diagnosis not present

## 2020-11-18 DIAGNOSIS — M545 Low back pain, unspecified: Secondary | ICD-10-CM | POA: Insufficient documentation

## 2020-11-18 DIAGNOSIS — R252 Cramp and spasm: Secondary | ICD-10-CM | POA: Diagnosis not present

## 2020-11-18 NOTE — Therapy (Signed)
[facility] Out[facility] & Specialty Rehab @ [facility] [address] [place], [place], [address] Phone:     Fax:     Physical Therapy Treatment  Patient Details  Name: Elizabeth Lozano MRN: [id number] Date of Birth: 07-20-1970 Referring Provider (PT): Dr Philip Aspen   Encounter Date: 11/18/2020   PT End of Session - 11/18/20 1023     Visit Number 2    Number of Visits 10    Date for PT Re-Evaluation 01/21/21    Authorization Type BCBS 30 visit limit:   10 visits remaining    PT Start Time 1022    PT Stop Time 1047    PT Time Calculation (min) 25 min    Activity Tolerance Other (comment)   limited by fear   Behavior During Therapy [facility] for tasks assessed/performed             Past Medical History:  Diagnosis Date   Allergy    she has seen Dr. Eileen Stanford    Anemia    long term issues per pt   Anxiety    due to back pain    Asthma    Depression    Heart murmur    History of stress test    ETT-echo (9/14):  ST depression noted on ECGs; no wall motion abnormalities at peak exercise on echocardiogram   Hypertension    VSD (ventricular septal defect)    Cardiac CTA 05/2008: Membranous VSD, 8-9 mm in diameter, mild RVE, EF 64%, calcium score 0, no CAD. Last echo 02/2011: EF 55-60%, mild CAD, perimembranous VSD slightly more prominent but no RVE or pulmonary hypertension;  Echo (9/14): Small perimembranous VSD-no significant change since 02/2011; EF 55-60%, moderate LAE    Past Surgical History:  Procedure Laterality Date   cyst left wrist     dental implants     WISDOM TOOTH EXTRACTION      There were no vitals filed for this visit.   Subjective Assessment - 11/18/20 1023     Subjective Feeling a little better since doing the decompression exercises. QLs and HS are a little tight. She held off on the band exercises. Just tightness, no pain today.    Patient Stated Goals I want some basic back exercises to handle QL, erector spinae    Currently in Pain?  No/denies                               [facility] Adult PT Treatment/Exercise - 11/18/20 0001       Lumbar Exercises: Supine   Other Supine Lumbar Exercises Decompression series 3-5 sec hold x 5 ea for neck, shoulders, leg lengthener, leg press      Lumbar Exercises: Prone   Other Prone Lumbar Exercises Pelvic press series: 5 sec hold x 5; with unilateral and bil knee bends x 5 ea; with hip ext x 5 bil; Upper sequence: chin tuck/shoulder raise x 5; arms at T x 5                     PT Education - 11/18/20 1045     Education Details HEP    Person(s) Educated Patient    Methods Explanation;Demonstration;Verbal cues;Handout;Tactile cues    Comprehension Verbalized understanding;Returned demonstration              PT Short Term Goals - 11/12/20 1557       PT SHORT  TERM GOAL #1   Title be independent with initial HEP    Time 5    Period Weeks    Status New    Target Date 12/17/20      PT SHORT TERM GOAL #2   Title report a 30% reduction in bil back pain with sitting and standing    Time 5    Period Weeks    Status New      PT SHORT TERM GOAL #3   Title The patient will be able to sit/drive 99-24 minutes with pain level 5/10 or less    Time 5    Period Weeks    Status New      PT SHORT TERM GOAL #4   Title Patient will demonstrate hip hinge to avoid back exacerbation while brushing her teeth    Time 5    Period Weeks    Status New      PT SHORT TERM GOAL #5   Title .Marland KitchenMarland Kitchen               PT Long Term Goals - 11/12/20 1600       PT LONG TERM GOAL #1   Title be independent in advanced HEP    Time 10    Period Weeks    Status New    Target Date 01/21/21      PT LONG TERM GOAL #2   Title Improve FOTO score from 49% to 62%    Time 10    Period Weeks    Status New      PT LONG TERM GOAL #3   Title Pt will be able to sit for driving, play computer games, watching TV up to 25 min with min pain    Time 10    Period Weeks     Status New      PT LONG TERM GOAL #4   Title The patient will have improved hip and lumbo pelvic strength to grossly 4+/5 needed for standing/walking longer periods of time    Time 10    Period Weeks    Status New      PT LONG TERM GOAL #5   Title The patient will be able to walk 20 min with min pain    Time 10    Period Weeks    Status New                   Plan - 11/18/20 1053     Clinical Impression Statement Patient returns for first f/u visit reporting improvement since eval. She has only done decompression exercises portion of her HEP for fear of return of spasm, but reports that they helped considerably. She was reluctant to do higher level exercises today, so we focused on prone pelvic press series which she tolerated well. We stopped before the [place] kick progression for lower body and T raises for upper body at pt request. Session shortened at request of pt so she can see response to todays work. She is working on increasing her tolerance to driving. She will benefit from continued PT to progress resiliance with functional activities.    PT Frequency 1x / week    PT Duration 8 weeks    PT Treatment/Interventions ADLs/Self Care Home Management;Aquatic Therapy;Cryotherapy;Electrical Stimulation;Ultrasound;Traction;Moist Heat;Iontophoresis 4mg /ml Dexamethasone;Therapeutic activities;Therapeutic exercise;Neuromuscular re-education;Manual techniques;Patient/family education;Dry needling;Spinal Manipulations;Taping    PT Next Visit Plan low intensity grade exposure to exercise with limited reps in supine initially;  high pain sensitivity;  elongation  of lumbar musculature and trunk extensor muscle activation and strengthening as tolerated; pt to consider aquatic PT    PT Home Exercise Plan [phone]    Consulted and Agree with Plan of Care Patient             Patient will benefit from skilled therapeutic intervention in order to improve the following deficits and  impairments:  Decreased range of motion, Difficulty walking, Increased fascial restricitons, Impaired UE functional use, Pain, Decreased activity tolerance, Impaired flexibility, Decreased strength  Visit Diagnosis: Muscle weakness (generalized)  Acute bilateral low back pain without sciatica  Cramp and spasm     Problem List Patient Active Problem List   Diagnosis Date Noted   Vitamin D deficiency 12/03/2019   Hyperlipidemia 12/03/2019   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

## 2020-11-18 NOTE — Patient Instructions (Signed)
Pelvic Press     Place hands under belly between navel and pubic bone, palms up. Feel pressure on hands. Increase pressure on hands by pressing pelvis down. Do not squeeze your glutes. This is NOT a pelvic tilt. Hold __5_ seconds. Relax. Repeat _10__ times. Once a day.  KNEE: Flexion - Prone   Hold pelvic press. Bend knee, then return the foot down. Repeat on opposite leg. Do not raise hips. _10__ reps per set. When this is mastered, pull both heels up at same time, x 10 reps.  Once a day   Leg Lift: One-Leg   Press pelvis down. Keep knee straight; lengthen and lift one leg (from waist). Do not twist body. Keep other leg down. Hold _1__ seconds. Relax. Repeat 10 time. Repeat with other leg.  HIP: Extension / KNEE: Flexion - Prone    Hold pelvic press. Bend knee, squeeze glutes. Raise leg up  10___ reps per set, _1__ sets per day, _1__ time a day.   Axial Extension- Upper body sequence * always start with pelvic press    Lie on stomach with forehead resting on floor and arms at sides. Tuck chin in and raise head from floor without bending it up or down. Repeat ___10_ times per set. Do __1__ sets per session. Do _1___ sessions per day.  Progression:  Arms at side Arms in T shape Arms in W shape  Arms in M shape Arms in Y shape  [facility] Outpatient Rehab at [facility] [address] [place], [place] [address]  [phone] (office) [phone] (fax)

## 2020-11-25 ENCOUNTER — Other Ambulatory Visit: Payer: Self-pay

## 2020-11-25 ENCOUNTER — Encounter: Payer: Self-pay | Admitting: Rehabilitative and Restorative Service Providers"

## 2020-11-25 ENCOUNTER — Ambulatory Visit: Payer: BC Managed Care – PPO | Admitting: Rehabilitative and Restorative Service Providers"

## 2020-11-25 DIAGNOSIS — M6281 Muscle weakness (generalized): Secondary | ICD-10-CM

## 2020-11-25 DIAGNOSIS — R252 Cramp and spasm: Secondary | ICD-10-CM | POA: Diagnosis not present

## 2020-11-25 DIAGNOSIS — M545 Low back pain, unspecified: Secondary | ICD-10-CM | POA: Diagnosis not present

## 2020-11-25 NOTE — Therapy (Signed)
[facility] Outpatient & Specialty Rehab @ [facility] [address] [place], [place], [address] Phone: [phone]   Fax:  [phone]  Physical Therapy Treatment  Patient Details  Name: Elizabeth Lozano MRN: [id number] Date of Birth: 11-10-1970 Referring Provider (PT): Dr Philip Aspen   Encounter Date: 11/25/2020   PT End of Session - 11/25/20 1023     Visit Number 3    Number of Visits 10    Date for PT Re-Evaluation 01/21/21    Authorization Type BCBS 30 visit limit:   9 visits remaining    Authorization - Visit Number 2    Authorization - Number of Visits 10    PT Start Time 1015    PT Stop Time 1040    PT Time Calculation (min) 25 min    Activity Tolerance Other (comment)   limited by fear   Behavior During Therapy Anxious             Past Medical History:  Diagnosis Date   Allergy    she has seen Dr. Eileen Stanford    Anemia    long term issues per pt   Anxiety    due to back pain    Asthma    Depression    Heart murmur    History of stress test    ETT-echo (9/14):  ST depression noted on ECGs; no wall motion abnormalities at peak exercise on echocardiogram   Hypertension    VSD (ventricular septal defect)    Cardiac CTA 05/2008: Membranous VSD, 8-9 mm in diameter, mild RVE, EF 64%, calcium score 0, no CAD. Last echo 02/2011: EF 55-60%, mild CAD, perimembranous VSD slightly more prominent but no RVE or pulmonary hypertension;  Echo (9/14): Small perimembranous VSD-no significant change since 02/2011; EF 55-60%, moderate LAE    Past Surgical History:  Procedure Laterality Date   cyst left wrist     dental implants     WISDOM TOOTH EXTRACTION      There were no vitals filed for this visit.   Subjective Assessment - 11/25/20 1020     Subjective Pt reports that HEP was going well, but then she tried to add some more advanced exercises and feels increased pain.    Patient Stated Goals I want some basic back exercises to handle QL, erector  spinae    Currently in Pain? Yes    Pain Score 4     Pain Location Back    Pain Orientation Lower    Pain Descriptors / Indicators Tightness    Pain Radiating Towards obliques, psoas, into L hip                               [facility] Adult PT Treatment/Exercise - 11/25/20 0001       Lumbar Exercises: Aerobic   Nustep L3 x4 min      Lumbar Exercises: Seated   Other Seated Lumbar Exercises Seated squeezing ball and rotating B directions, Obliques to B sides holding pillow.  2x10      Lumbar Exercises: Supine   Ab Set 20 reps;2 seconds    AB Set Limitations with red pball squeeze      Knee/Hip Exercises: Stretches   Hip Flexor Stretch Both;1 rep    Hip Flexor Stretch Limitations in standing                       PT  Short Term Goals - 11/25/20 1059       PT SHORT TERM GOAL #1   Title be independent with initial HEP    Status On-going      PT SHORT TERM GOAL #2   Title report a 30% reduction in bil back pain with sitting and standing    Status On-going      PT SHORT TERM GOAL #3   Title The patient will be able to sit/drive 93-81 minutes with pain level 5/10 or less    Status On-going      PT SHORT TERM GOAL #4   Title Patient will demonstrate hip hinge to avoid back exacerbation while brushing her teeth    Status On-going               PT Long Term Goals - 11/12/20 1600       PT LONG TERM GOAL #1   Title be independent in advanced HEP    Time 10    Period Weeks    Status New    Target Date 01/21/21      PT LONG TERM GOAL #2   Title Improve FOTO score from 49% to 62%    Time 10    Period Weeks    Status New      PT LONG TERM GOAL #3   Title Pt will be able to sit for driving, play computer games, watching TV up to 25 min with min pain    Time 10    Period Weeks    Status New      PT LONG TERM GOAL #4   Title The patient will have improved hip and lumbo pelvic strength to grossly 4+/5 needed for standing/walking  longer periods of time    Time 10    Period Weeks    Status New      PT LONG TERM GOAL #5   Title The patient will be able to walk 20 min with min pain    Time 10    Period Weeks    Status New                   Plan - 11/25/20 1053     Clinical Impression Statement Pt returns to visit stating that she was doing better initially after visit, however, she tried to progress to prone T raises and had a flare and now her obliques are too painful.  Pt self limiting during PT session and would decline exercises suggested by PT. Pt stated that she only wanted exercises today that would focus on her obliques and tight psoas.  Instructed pt in gentle seated oblique and thoracic rotation ROM and well as standing hip flexor stretch with knee supported on mat for improved stretch.  Pt verbalizes following the hip flexor stretch that she felt much better and she would add this to her HEP.  Pt was ready to end session at that time.    PT Treatment/Interventions ADLs/Self Care Home Management;Aquatic Therapy;Cryotherapy;Electrical Stimulation;Ultrasound;Traction;Moist Heat;Iontophoresis 4mg /ml Dexamethasone;Therapeutic activities;Therapeutic exercise;Neuromuscular re-education;Manual techniques;Patient/family education;Dry needling;Spinal Manipulations;Taping    PT Next Visit Plan low intensity grade exposure to exercise with limited reps in supine initially;  high pain sensitivity;  elongation of lumbar musculature and trunk extensor muscle activation and strengthening as tolerated; pt to consider aquatic PT    Consulted and Agree with Plan of Care Patient             Patient will benefit from skilled therapeutic intervention  in order to improve the following deficits and impairments:  Decreased range of motion, Difficulty walking, Increased fascial restricitons, Impaired UE functional use, Pain, Decreased activity tolerance, Impaired flexibility, Decreased strength  Visit Diagnosis: Muscle  weakness (generalized)  Acute bilateral low back pain without sciatica  Cramp and spasm     Problem List Patient Active Problem List   Diagnosis Date Noted   Vitamin D deficiency 12/03/2019   Hyperlipidemia 12/03/2019   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 Out[facility] & Specialty Rehab @ [facility] [address] [place], [place], [address] Phone: [phone]   Fax:  [phone]  Name: Florine Sprenkle MRN: [id number] Date of Birth: Apr 02, 1970

## 2020-12-02 ENCOUNTER — Ambulatory Visit: Payer: BC Managed Care – PPO | Admitting: Rehabilitative and Restorative Service Providers"

## 2020-12-02 ENCOUNTER — Other Ambulatory Visit: Payer: Self-pay

## 2020-12-02 ENCOUNTER — Encounter: Payer: Self-pay | Admitting: Rehabilitative and Restorative Service Providers"

## 2020-12-02 DIAGNOSIS — R252 Cramp and spasm: Secondary | ICD-10-CM | POA: Diagnosis not present

## 2020-12-02 DIAGNOSIS — M545 Low back pain, unspecified: Secondary | ICD-10-CM

## 2020-12-02 DIAGNOSIS — M6281 Muscle weakness (generalized): Secondary | ICD-10-CM | POA: Diagnosis not present

## 2020-12-02 NOTE — Therapy (Signed)
[facility] Outpatient & Specialty Rehab @ [facility] [address] [place], [place], [address] Phone: [phone]   Fax:  [phone]  Physical Therapy Treatment  Patient Details  Name: Elizabeth Lozano MRN: [id number] Date of Birth: 08-20-1970 Referring Provider (PT): Dr Philip Aspen   Encounter Date: 12/02/2020   PT End of Session - 12/02/20 1030     Visit Number 4    Number of Visits 10    Date for PT Re-Evaluation 01/21/21    Authorization Type BCBS 30 visit limit:   8 visits remaining    Authorization - Visit Number 3    Authorization - Number of Visits 10    PT Start Time 1018    PT Stop Time 1045    PT Time Calculation (min) 27 min             Past Medical History:  Diagnosis Date   Allergy    she has seen Dr. Eileen Stanford    Anemia    long term issues per pt   Anxiety    due to back pain    Asthma    Depression    Heart murmur    History of stress test    ETT-echo (9/14):  ST depression noted on ECGs; no wall motion abnormalities at peak exercise on echocardiogram   Hypertension    VSD (ventricular septal defect)    Cardiac CTA 05/2008: Membranous VSD, 8-9 mm in diameter, mild RVE, EF 64%, calcium score 0, no CAD. Last echo 02/2011: EF 55-60%, mild CAD, perimembranous VSD slightly more prominent but no RVE or pulmonary hypertension;  Echo (9/14): Small perimembranous VSD-no significant change since 02/2011; EF 55-60%, moderate LAE    Past Surgical History:  Procedure Laterality Date   cyst left wrist     dental implants     WISDOM TOOTH EXTRACTION      There were no vitals filed for this visit.   Subjective Assessment - 12/02/20 1028     Subjective Pt reports that she is doing HEP.  Pt asking about UBE that she used to do at the gym to see if this would be a good exercise.    Patient Stated Goals I want some basic back exercises to handle QL, erector spinae    Currently in Pain? Yes    Pain Score 1     Pain Location Back    Pain  Orientation Lower    Pain Radiating Towards obliques, psoas, into hip flexors                               [facility] Adult PT Treatment/Exercise - 12/02/20 0001       Lumbar Exercises: Aerobic   UBE (Upper Arm Bike) L1.0 x2 min each way    Nustep L3 x4 min      Lumbar Exercises: Machines for Strengthening   Other Lumbar Machine Exercise Lats 10# x10      Lumbar Exercises: Standing   Row Strengthening;Both;10 reps    Theraband Level (Row) Level 2 (Red)    Other Standing Lumbar Exercises overhead ball shoulder extension holding red wt ball, trunk rotation holding red wt ball. x10 each    Other Standing Lumbar Exercises "T's and W's" x5 each                       PT Short Term Goals - 12/02/20 1157  PT SHORT TERM GOAL #1   Title be independent with initial HEP    Status Achieved      PT SHORT TERM GOAL #2   Title report a 30% reduction in bil back pain with sitting and standing    Status On-going               PT Long Term Goals - 11/12/20 1600       PT LONG TERM GOAL #1   Title be independent in advanced HEP    Time 10    Period Weeks    Status New    Target Date 01/21/21      PT LONG TERM GOAL #2   Title Improve FOTO score from 49% to 62%    Time 10    Period Weeks    Status New      PT LONG TERM GOAL #3   Title Pt will be able to sit for driving, play computer games, watching TV up to 25 min with min pain    Time 10    Period Weeks    Status New      PT LONG TERM GOAL #4   Title The patient will have improved hip and lumbo pelvic strength to grossly 4+/5 needed for standing/walking longer periods of time    Time 10    Period Weeks    Status New      PT LONG TERM GOAL #5   Title The patient will be able to walk 20 min with min pain    Time 10    Period Weeks    Status New                   Plan - 12/02/20 1150     Clinical Impression Statement Pt able to tolerate more ther ex today and add in  strengthening exercises. Pt educated about aquatic therapy and she would benefit from gravity minimized effects that pool provides and she is agreeable to trying pool therapy. Pt required min cuing for posture and core stability during ther ex. Pt educated on performing "T's" and "W's" in standing to decrased pain felt in prone position.    PT Treatment/Interventions ADLs/Self Care Home Management;Aquatic Therapy;Cryotherapy;Electrical Stimulation;Ultrasound;Traction;Moist Heat;Iontophoresis 4mg /ml Dexamethasone;Therapeutic activities;Therapeutic exercise;Neuromuscular re-education;Manual techniques;Patient/family education;Dry needling;Spinal Manipulations;Taping    PT Next Visit Plan aquatic therapy, low intensity grade exposure to exercise with limited reps in supine initially;  high pain sensitivity;  elongation of lumbar musculature and trunk extensor muscle activation and strengthening as tolerated    Consulted and Agree with Plan of Care Patient             Patient will benefit from skilled therapeutic intervention in order to improve the following deficits and impairments:  Decreased range of motion, Difficulty walking, Increased fascial restricitons, Impaired UE functional use, Pain, Decreased activity tolerance, Impaired flexibility, Decreased strength  Visit Diagnosis: Muscle weakness (generalized)  Acute bilateral low back pain without sciatica  Cramp and spasm     Problem List Patient Active Problem List   Diagnosis Date Noted   Vitamin D deficiency 12/03/2019   Hyperlipidemia 12/03/2019   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

## 2020-12-02 NOTE — Patient Instructions (Signed)
     [facility] Welcome to [facility]! Here you will find all the information you will need regarding your pool therapy. If you have further questions at any time, please call our office at [phone]. After completing your initial evaluation in the [facility] clinic, you may be eligible to complete a portion of your therapy in the pool. A typical week of therapy will consist of 1-2 typical physical therapy visits at our [facility] location and an additional session of therapy in the pool located at the [facility] at [address]. [address], [place] [address]. The phone number at the pool site is [phone]. Please call this number if you are running late or need to cancel your appointment.  Aquatic therapy will be offered on Friday afternoons. Each session will last approximately 45 minutes. All scheduling and payments for aquatic therapy sessions, including cancelations, will be done through our [facility] location.  To be eligible for aquatic therapy, these criteria must be met: You must be able to independently change in the locker room and get to the pool deck. A caregiver can come with you to help if needed. There are bleachers for a caregiver to sit on next to the pool. No one with an open wound is permitted in the pool.  Handicap parking is available in the front and there is a drop off option for even closer accessibility. Please arrive 15 minutes prior to your appointment to prepare for your pool session. You must sign in at the front desk upon your arrival. Please be sure to attend to any toileting needs prior to entering the pool. Locker rooms for changing are located to the right of the check-in desk. There is direct access to the pool deck from the locker room. You can lock your belongings in a locker but must bring a lock. Your therapist will greet you on the pool deck. There may be other swimmers in the pool at the  same time but your session is one-on-one with the therapist.   

## 2020-12-09 ENCOUNTER — Encounter: Payer: BC Managed Care – PPO | Admitting: Rehabilitative and Restorative Service Providers"

## 2020-12-09 ENCOUNTER — Ambulatory Visit: Payer: BC Managed Care – PPO | Admitting: Physical Therapy

## 2020-12-16 ENCOUNTER — Other Ambulatory Visit: Payer: Self-pay

## 2020-12-16 ENCOUNTER — Ambulatory Visit: Payer: BC Managed Care – PPO | Attending: Internal Medicine | Admitting: Physical Therapy

## 2020-12-16 ENCOUNTER — Encounter: Payer: Self-pay | Admitting: Physical Therapy

## 2020-12-16 ENCOUNTER — Encounter: Payer: BC Managed Care – PPO | Admitting: Rehabilitative and Restorative Service Providers"

## 2020-12-16 DIAGNOSIS — M545 Low back pain, unspecified: Secondary | ICD-10-CM | POA: Insufficient documentation

## 2020-12-16 DIAGNOSIS — M6281 Muscle weakness (generalized): Secondary | ICD-10-CM | POA: Insufficient documentation

## 2020-12-16 DIAGNOSIS — R252 Cramp and spasm: Secondary | ICD-10-CM | POA: Insufficient documentation

## 2020-12-16 NOTE — Therapy (Signed)
[facility] @ [facility] [address] [place], [place], [address] Phone: [phone]   Fax:  [phone]  Physical Therapy Treatment  Patient Details  Name: Laryn Venning MRN: [id number] Date of Birth: [birth year] Referring Provider (PT): Dr Philip Aspen   Encounter Date: 12/16/2020   PT End of Session - 12/16/20 1330     Visit Number 5    Number of Visits 10    Date for PT Re-Evaluation 01/21/21    Authorization Type BCBS 30 visit limit:   8 visits remaining    Authorization - Visit Number 4    Authorization - Number of Visits 10    PT Start Time 1101    PT Stop Time 1140    PT Time Calculation (min) 39 min    Activity Tolerance Patient tolerated treatment well    Behavior During Therapy Anxious             Past Medical History:  Diagnosis Date   Allergy    she has seen Dr. Eileen Stanford    Anemia    long term issues per pt   Anxiety    due to back pain    Asthma    Depression    Heart murmur    History of stress test    ETT-echo (9/14):  ST depression noted on ECGs; no wall motion abnormalities at peak exercise on echocardiogram   Hypertension    VSD (ventricular septal defect)    Cardiac CTA 05/2008: Membranous VSD, 8-9 mm in diameter, mild RVE, EF 64%, calcium score 0, no CAD. Last echo 02/2011: EF 55-60%, mild CAD, perimembranous VSD slightly more prominent but no RVE or pulmonary hypertension;  Echo (9/14): Small perimembranous VSD-no significant change since 02/2011; EF 55-60%, moderate LAE    Past Surgical History:  Procedure Laterality Date   cyst left wrist     dental implants     WISDOM TOOTH EXTRACTION      There were no vitals filed for this visit.   Subjective Assessment - 12/16/20 1328     Subjective I am doing well over all. I am nervous today bc water is a new thing and new thigns make me nervous, but I am going to be ok...reports no current pain just a tightness that wraps around her waist.     Currently in Pain? No/denies            Treatment: Patient seen for aquatic therapy today.  Treatment took place in water 2.5-4 feet deep depending upon activity.  Pt entered the pool via stairs with mild use of hand rails. Pt requires buoyancy of water for support and to offload joints with strengthening exercises.  Water temp 94 degrees F.  Seated water bench with 75% submersion Pt performed seated LE AROM exercises 20x in all planes, pain assessment concurrent.  75% submersion for the following: water walking 4 lengths of each direction with Vc for gentle UE movements. Abdominal compressions against the wall 5 sec hold 5x. 10x 3 ways hip lifts holding onto pool wall gently. Intermittent 30 sec rest breaks throughout. Seated decompression with large noodle behind patient. Intermittent LE AROM  Single buoy UE weights for shoulder horizontal add/abd 10x 1/4 submersion.                            PT Education - 12/16/20 1329     Education Details Water principles  Person(s) Educated Patient    Methods Explanation    Comprehension Verbalized understanding              PT Short Term Goals - 12/02/20 1157       PT SHORT TERM GOAL #1   Title be independent with initial HEP    Status Achieved      PT SHORT TERM GOAL #2   Title report a 30% reduction in bil back pain with sitting and standing    Status On-going               PT Long Term Goals - 11/12/20 1600       PT LONG TERM GOAL #1   Title be independent in advanced HEP    Time 10    Period Weeks    Status New    Target Date 01/21/21      PT LONG TERM GOAL #2   Title Improve FOTO score from 49% to 62%    Time 10    Period Weeks    Status New      PT LONG TERM GOAL #3   Title Pt will be able to sit for driving, play computer games, watching TV up to 25 min with min pain    Time 10    Period Weeks    Status New      PT LONG TERM GOAL #4   Title The patient will have improved  hip and lumbo pelvic strength to grossly 4+/5 needed for standing/walking longer periods of time    Time 10    Period Weeks    Status New      PT LONG TERM GOAL #5   Title The patient will be able to walk 20 min with min pain    Time 10    Period Weeks    Status New                   Plan - 12/16/20 1639     Clinical Impression Statement Pt arrives for her first aquatic session today with no pain but a nervousness to try a new modality and a feeling of tightness wrapping around her lower trunk. Pt appears to tolerate all gentle AROM exercises in th ewater today, no pain increases. Pt was verbally eduated in water principles today and how we would use them. No outward signs of excessive fatigue.    Personal Factors and Comorbidities Comorbidity 1;Comorbidity 2;Comorbidity 3+    Comorbidities chronic history; multi regions affected;  Depression; Asthma; HTN    Examination-Activity Limitations Locomotion Level;Sit;Carry;Squat;Lift;Stand;Stairs    Examination-Participation Restrictions Meal Prep;Cleaning;Occupation;Shop;Laundry;Community Activity    Stability/Clinical Decision Making Evolving/Moderate complexity    Rehab Potential Good    PT Frequency 1x / week    PT Duration 8 weeks    PT Treatment/Interventions ADLs/Self Care Home Management;Aquatic Therapy;Cryotherapy;Electrical Stimulation;Ultrasound;Traction;Moist Heat;Iontophoresis 4mg /ml Dexamethasone;Therapeutic activities;Therapeutic exercise;Neuromuscular re-education;Manual techniques;Patient/family education;Dry needling;Spinal Manipulations;Taping    PT Next Visit Plan aquatic therapy, low intensity grade exposure to exercise with limited reps in supine initially;  high pain sensitivity;  elongation of lumbar musculature and trunk extensor muscle activation and strengthening as tolerated    PT Home Exercise Plan [phone]             Patient will benefit from skilled therapeutic intervention in order to improve the  following deficits and impairments:  Decreased range of motion, Difficulty walking, Increased fascial restricitons, Impaired UE functional use, Pain, Decreased activity tolerance, Impaired flexibility, Decreased  strength  Visit Diagnosis: Muscle weakness (generalized)  Acute bilateral low back pain without sciatica  Cramp and spasm     Problem List Patient Active Problem List   Diagnosis Date Noted   Vitamin D deficiency 12/03/2019   Hyperlipidemia 12/03/2019   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

## 2020-12-23 ENCOUNTER — Ambulatory Visit: Payer: BC Managed Care – PPO | Admitting: Physical Therapy

## 2020-12-23 ENCOUNTER — Other Ambulatory Visit: Payer: Self-pay

## 2020-12-23 ENCOUNTER — Encounter: Payer: BC Managed Care – PPO | Admitting: Rehabilitative and Restorative Service Providers"

## 2020-12-23 DIAGNOSIS — M6281 Muscle weakness (generalized): Secondary | ICD-10-CM

## 2020-12-23 DIAGNOSIS — R252 Cramp and spasm: Secondary | ICD-10-CM | POA: Diagnosis not present

## 2020-12-23 DIAGNOSIS — M545 Low back pain, unspecified: Secondary | ICD-10-CM

## 2020-12-24 ENCOUNTER — Encounter: Payer: Self-pay | Admitting: Physical Therapy

## 2020-12-24 NOTE — Therapy (Addendum)
[facility] @ [place], [place], [address] Phone: [phone]   Fax:  [phone]  Physical Therapy Treatment  Patient Details  Name: Elizabeth Lozano MRN: [id number] Date of Birth: 06/13/1970 Referring Provider (PT): Dr Isaac Bliss   Encounter Date: 12/23/2020   PT End of Session - 12/23/20 1059     Visit Number 6    Number of Visits 10    Date for PT Re-Evaluation 01/21/21    Authorization Type BCBS 30 visit limit:   8 visits remaining    Authorization - Visit Number 6    Authorization - Number of Visits 10    PT Start Time 1100    PT Stop Time 1140    PT Time Calculation (min) 40 min    Activity Tolerance Patient tolerated treatment well    Behavior During Therapy [facility] for tasks assessed/performed             Past Medical History:  Diagnosis Date   Allergy    she has seen Dr. Tiajuana Amass    Anemia    long term issues per pt   Anxiety    due to back pain    Asthma    Depression    Heart murmur    History of stress test    ETT-echo (9/14):  ST depression noted on ECGs; no wall motion abnormalities at peak exercise on echocardiogram   Hypertension    VSD (ventricular septal defect)    Cardiac CTA 05/2008: Membranous VSD, 8-9 mm in diameter, mild RVE, EF 64%, calcium score 0, no CAD. Last echo 02/2011: EF 55-60%, mild CAD, perimembranous VSD slightly more prominent but no RVE or pulmonary hypertension;  Echo (9/14): Small perimembranous VSD-no significant change since 02/2011; EF 55-60%, moderate LAE    Past Surgical History:  Procedure Laterality Date   cyst left wrist     dental implants     WISDOM TOOTH EXTRACTION      There were no vitals filed for this visit.   Subjective Assessment - 12/24/20 0631     Subjective I did well last time in the pool, I think I can do more.    Currently in Pain? No/denies    Multiple Pain Sites No            Treatment: Patient seen for aquatic therapy  today.  Treatment took place in water 2.5-4 feet deep depending upon activity.  Pt entered the pool via steps, reciprocally with mild use of rails. Pt requires buoyancy of water for support and to offload joints with strengthening exercises.  Pt utilizes viscosity of the water required for strengthening. Water temp 95 degrees F.  Standing in 75% submersion: wATER walking with large noodle postural pusj & pull 10x each direction, side stepping with mini squat and mitten hand shld abd/add alos 10x; PPT against the wall to perform core compressions with neck pillow 5 sec hold 10x, HIgh knee marching with meduim noodle push to activate lats 10x, addded marching across the pool 4 lengths; Hip abd & circumduction 15x Bil with finger support on wall; large noodle supine hip thrust VC to contract her gluteals 3x 10 sec Seated decompression with large noodle behind patient. PTA with 30 sec bicycle  30 sec rest 4 bouts.                              PT Short Term  Goals - 12/02/20 1157       PT SHORT TERM GOAL #1   Title be independent with initial HEP    Status Achieved      PT SHORT TERM GOAL #2   Title report a 30% reduction in bil back pain with sitting and standing    Status On-going               PT Long Term Goals - 11/12/20 1600       PT LONG TERM GOAL #1   Title be independent in advanced HEP    Time 10    Period Weeks    Status New    Target Date 01/21/21      PT LONG TERM GOAL #2   Title Improve FOTO score from 49% to 62%    Time 10    Period Weeks    Status New      PT LONG TERM GOAL #3   Title Pt will be able to sit for driving, play computer games, watching TV up to 25 min with min pain    Time 10    Period Weeks    Status New      PT LONG TERM GOAL #4   Title The patient will have improved hip and lumbo pelvic strength to grossly 4+/5 needed for standing/walking longer periods of time    Time 10    Period Weeks    Status New      PT  LONG TERM GOAL #5   Title The patient will be able to walk 20 min with min pain    Time 10    Period Weeks    Status New                   Plan - 12/24/20 8889     Clinical Impression Statement Pt reports doing well after her initial aquatics visit. She does report today she may be out of town for a while due to her father illness. Pt was able to increase her push & pull today to increase her resistance with all her exercises and did not reproduce any pain. Pt reports improving sense of lower abdominal strength.    Personal Factors and Comorbidities Comorbidity 1;Comorbidity 2;Comorbidity 3+    Comorbidities chronic history; multi regions affected;  Depression; Asthma; HTN    Examination-Activity Limitations Locomotion Level;Sit;Carry;Squat;Lift;Stand;Stairs    Examination-Participation Restrictions Meal Prep;Cleaning;Occupation;Shop;Laundry;Community Activity    Stability/Clinical Decision Making Evolving/Moderate complexity    Rehab Potential Good    PT Frequency 1x / week    PT Duration 8 weeks    PT Treatment/Interventions ADLs/Self Care Home Management;Aquatic Therapy;Cryotherapy;Electrical Stimulation;Ultrasound;Traction;Moist Heat;Iontophoresis 59m/ml Dexamethasone;Therapeutic activities;Therapeutic exercise;Neuromuscular re-education;Manual techniques;Patient/family education;Dry needling;Spinal Manipulations;Taping    PT Next Visit Plan aquatic therapy, low intensity grade exposure to exercise with limited reps in supine initially;  high pain sensitivity;  elongation of lumbar musculature and trunk extensor muscle activation and strengthening as tolerated: pt may be out of town for unknown period of time.    PT Home Exercise Plan [phone]   Consulted and Agree with Plan of Care Patient             Patient will benefit from skilled therapeutic intervention in order to improve the following deficits and impairments:  Decreased range of motion, Difficulty walking,  Increased fascial restricitons, Impaired UE functional use, Pain, Decreased activity tolerance, Impaired flexibility, Decreased strength  Visit Diagnosis: Muscle weakness (generalized)  Acute bilateral low back pain  without sciatica  Cramp and spasm     Problem List Patient Active Problem List   Diagnosis Date Noted   Vitamin D deficiency 12/03/2019   Hyperlipidemia 12/03/2019   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  Patient agrees to discharge. Patient goals were partially met. Patient is being discharged due to not returning since the last visit.   Sheryl Towell, PTA 12/24/2020, 6:35 AM  Juel Burrow, PT, DPT 01/11/2021, 11:11 AM  [facility] @ [place], [place], [address] Phone: [phone]   Fax:  [phone]  Name: Anamika Kueker MRN: [id number] Date of Birth: 1971/02/08

## 2021-03-03 DIAGNOSIS — Z01419 Encounter for gynecological examination (general) (routine) without abnormal findings: Secondary | ICD-10-CM | POA: Diagnosis not present

## 2021-03-03 DIAGNOSIS — Z6826 Body mass index (BMI) 26.0-26.9, adult: Secondary | ICD-10-CM | POA: Diagnosis not present

## 2021-03-03 DIAGNOSIS — R319 Hematuria, unspecified: Secondary | ICD-10-CM | POA: Diagnosis not present

## 2021-03-03 DIAGNOSIS — N920 Excessive and frequent menstruation with regular cycle: Secondary | ICD-10-CM | POA: Diagnosis not present

## 2021-03-04 LAB — HM PAP SMEAR

## 2021-03-17 DIAGNOSIS — Z1231 Encounter for screening mammogram for malignant neoplasm of breast: Secondary | ICD-10-CM | POA: Diagnosis not present

## 2021-03-17 LAB — HM MAMMOGRAPHY

## 2021-03-23 DIAGNOSIS — R319 Hematuria, unspecified: Secondary | ICD-10-CM | POA: Diagnosis not present

## 2021-04-12 ENCOUNTER — Encounter: Payer: Self-pay | Admitting: Internal Medicine

## 2021-04-14 MED ORDER — LEVALBUTEROL TARTRATE 45 MCG/ACT IN AERO: 2.0000 | INHALATION_SPRAY | Freq: Four times a day (QID) | RESPIRATORY_TRACT | 12 refills | Status: AC | PRN

## 2021-05-07 DIAGNOSIS — R8279 Other abnormal findings on microbiological examination of urine: Secondary | ICD-10-CM | POA: Diagnosis not present

## 2021-05-13 ENCOUNTER — Encounter: Payer: Self-pay | Admitting: Internal Medicine

## 2021-07-22 ENCOUNTER — Encounter: Payer: Self-pay | Admitting: Internal Medicine

## 2021-07-22 ENCOUNTER — Ambulatory Visit (INDEPENDENT_AMBULATORY_CARE_PROVIDER_SITE_OTHER): Payer: BC Managed Care – PPO | Admitting: Internal Medicine

## 2021-07-22 ENCOUNTER — Other Ambulatory Visit: Payer: Self-pay

## 2021-07-22 VITALS — BP 130/90 | HR 70 | Temp 98.2°F | Ht 65.75 in | Wt 162.4 lb

## 2021-07-22 DIAGNOSIS — E559 Vitamin D deficiency, unspecified: Secondary | ICD-10-CM

## 2021-07-22 DIAGNOSIS — L821 Other seborrheic keratosis: Secondary | ICD-10-CM | POA: Diagnosis not present

## 2021-07-22 DIAGNOSIS — Q210 Ventricular septal defect: Secondary | ICD-10-CM | POA: Diagnosis not present

## 2021-07-22 DIAGNOSIS — I10 Essential (primary) hypertension: Secondary | ICD-10-CM

## 2021-07-22 DIAGNOSIS — D485 Neoplasm of uncertain behavior of skin: Secondary | ICD-10-CM | POA: Diagnosis not present

## 2021-07-22 DIAGNOSIS — E782 Mixed hyperlipidemia: Secondary | ICD-10-CM

## 2021-07-22 DIAGNOSIS — Z0000 Encounter for general adult medical examination without abnormal findings: Secondary | ICD-10-CM | POA: Diagnosis not present

## 2021-07-22 DIAGNOSIS — L814 Other melanin hyperpigmentation: Secondary | ICD-10-CM | POA: Diagnosis not present

## 2021-07-22 DIAGNOSIS — D2339 Other benign neoplasm of skin of other parts of face: Secondary | ICD-10-CM | POA: Diagnosis not present

## 2021-07-22 DIAGNOSIS — L820 Inflamed seborrheic keratosis: Secondary | ICD-10-CM | POA: Diagnosis not present

## 2021-07-22 LAB — CBC WITH DIFFERENTIAL/PLATELET
Basophils Absolute: 0.1 K/uL (ref 0.0–0.1)
Basophils Relative: 0.9 % (ref 0.0–3.0)
Eosinophils Absolute: 0.1 K/uL (ref 0.0–0.7)
Eosinophils Relative: 2.4 % (ref 0.0–5.0)
HCT: 41.1 % (ref 36.0–46.0)
Hemoglobin: 13.6 g/dL (ref 12.0–15.0)
Lymphocytes Relative: 34.1 % (ref 12.0–46.0)
Lymphs Abs: 1.8 K/uL (ref 0.7–4.0)
MCHC: 33 g/dL (ref 30.0–36.0)
MCV: 89.9 fl (ref 78.0–100.0)
Monocytes Absolute: 0.3 K/uL (ref 0.1–1.0)
Monocytes Relative: 5.8 % (ref 3.0–12.0)
Neutro Abs: 3 K/uL (ref 1.4–7.7)
Neutrophils Relative %: 56.8 % (ref 43.0–77.0)
Platelets: 229 K/uL (ref 150.0–400.0)
RBC: 4.57 Mil/uL (ref 3.87–5.11)
RDW: 14.2 % (ref 11.5–15.5)
WBC: 5.3 K/uL (ref 4.0–10.5)

## 2021-07-22 LAB — COMPREHENSIVE METABOLIC PANEL WITH GFR
ALT: 11 U/L (ref 0–35)
AST: 17 U/L (ref 0–37)
Albumin: 4.7 g/dL (ref 3.5–5.2)
Alkaline Phosphatase: 72 U/L (ref 39–117)
BUN: 11 mg/dL (ref 6–23)
CO2: 29 meq/L (ref 19–32)
Calcium: 10.2 mg/dL (ref 8.4–10.5)
Chloride: 102 meq/L (ref 96–112)
Creatinine, Ser: 0.8 mg/dL (ref 0.40–1.20)
GFR: 84.76 mL/min (ref >60.00)
Glucose, Bld: 89 mg/dL (ref 70–99)
Potassium: 4.1 meq/L (ref 3.5–5.1)
Sodium: 139 meq/L (ref 135–145)
Total Bilirubin: 0.6 mg/dL (ref 0.2–1.2)
Total Protein: 8 g/dL (ref 6.0–8.3)

## 2021-07-22 LAB — LIPID PANEL
Cholesterol: 205 mg/dL — ABNORMAL HIGH (ref 0–200)
HDL: 60.5 mg/dL (ref >39.00)
LDL Cholesterol: 132 mg/dL — ABNORMAL HIGH (ref 0–99)
NonHDL: 144.26
Total CHOL/HDL Ratio: 3
Triglycerides: 61 mg/dL (ref 0.0–149.0)
VLDL: 12.2 mg/dL (ref 0.0–40.0)

## 2021-07-22 LAB — VITAMIN D 25 HYDROXY (VIT D DEFICIENCY, FRACTURES): VITD: 59.7 ng/mL (ref 30.00–100.00)

## 2021-07-22 LAB — HEMOGLOBIN A1C: Hgb A1c MFr Bld: 5.5 % (ref 4.6–6.5)

## 2021-07-22 LAB — TSH: TSH: 1.97 u[IU]/mL (ref 0.35–5.50)

## 2021-07-22 LAB — VITAMIN B12: Vitamin B-12: 446 pg/mL (ref 211–911)

## 2021-07-22 MED ORDER — AMLODIPINE BESYLATE 5 MG PO TABS: 5.0000 mg | ORAL_TABLET | Freq: Every day | ORAL | 1 refills | Status: DC

## 2021-07-22 NOTE — Progress Notes (Signed)
Established Patient Office Visit     CC/Reason for Visit: Annual preventive exam, discuss elevated blood pressure  HPI: Elizabeth Lozano is a 51 y.o. female who is coming in today for the above mentioned reasons. Past Medical History is significant for: Depression that is significantly improved, she has a past history of hypertension but has usually been able to manage with lifestyle changes.  She has lately noted elevated pressures.  At times it has been as high as 150/90.  Her last week of measurements show blood pressure of 132/89, 133/92, 122/84, 118/83, 136/96.  She is otherwise feeling well.  She is overdue for an eye exam, she has routine dental care.  All immunizations are up-to-date.  She had a colonoscopy in 2021.  She just visited her GYN a few months ago and had Pap smear and mammogram done.   Past Medical/Surgical History: Past Medical History:  Diagnosis Date   Allergy    she has seen Dr. Eileen Stanford    Anemia    long term issues per pt   Anxiety    due to back pain    Asthma    Depression    Heart murmur    History of stress test    ETT-echo (9/14):  ST depression noted on ECGs; no wall motion abnormalities at peak exercise on echocardiogram   Hypertension    VSD (ventricular septal defect)    Cardiac CTA 05/2008: Membranous VSD, 8-9 mm in diameter, mild RVE, EF 64%, calcium score 0, no CAD. Last echo 02/2011: EF 55-60%, mild CAD, perimembranous VSD slightly more prominent but no RVE or pulmonary hypertension;  Echo (9/14): Small perimembranous VSD-no significant change since 02/2011; EF 55-60%, moderate LAE    Past Surgical History:  Procedure Laterality Date   cyst left wrist     dental implants     WISDOM TOOTH EXTRACTION      Social History:  reports that she has never smoked. She has never used smokeless tobacco. She reports current alcohol use of about 4.0 standard drinks of alcohol per week. She reports that she does not use drugs.  Allergies: Allergies   Allergen Reactions   Erythromycin Nausea And Vomiting   Iohexol Hives     Code: HIVES, Desc: RN AMY NOTICED HIVE ON STOMACH AFTER HEART SCAN. ALSO ONE ON BACK. NOT ITCHING UNTIL AFTER SHE KNEW OF HIVES. DR.NISHAN NOTIFIED. GIVEN BENADRYL., Onset Date: [id number]    Tetracycline Hives    Family History:  Family History  Problem Relation Age of Onset   Sarcoidosis Mother    Colon polyps Mother    Heart attack Father    Hypertension Father    Hyperlipidemia Father    Melanoma Father    Cancer Father    Healthy Brother    Colon cancer Paternal Grandmother    Crohn's disease Neg Hx    Esophageal cancer Neg Hx    Rectal cancer Neg Hx    Stomach cancer Neg Hx      Current Outpatient Medications:    amLODipine (NORVASC) 5 MG tablet, Take 1 tablet (5 mg total) by mouth daily., Disp: 90 tablet, Rfl: 1   cetirizine (ZYRTEC) 10 MG tablet, Take 10 mg by mouth daily., Disp: , Rfl:    Cholecalciferol (VITAMIN D3 PO), Take by mouth., Disp: , Rfl:    levalbuterol (XOPENEX HFA) 45 MCG/ACT inhaler, Inhale 2 puffs into the lungs every 6 (six) hours as needed for wheezing., Disp: 1 each, Rfl: 12  Multiple Vitamin (MULTIVITAMIN) tablet, Take 1 tablet by mouth daily. Centrum Mini's for Women 50 Plus, Disp: , Rfl:   Review of Systems:  Constitutional: Denies fever, chills, diaphoresis, appetite change and fatigue.  HEENT: Denies photophobia, eye pain, redness, hearing loss, ear pain, congestion, sore throat, rhinorrhea, sneezing, mouth sores, trouble swallowing, neck pain, neck stiffness and tinnitus.   Respiratory: Denies SOB, DOE, cough, chest tightness,  and wheezing.   Cardiovascular: Denies chest pain, palpitations and leg swelling.  Gastrointestinal: Denies nausea, vomiting, abdominal pain, diarrhea, constipation, blood in stool and abdominal distention.  Genitourinary: Denies dysuria, urgency, frequency, hematuria, flank pain and difficulty urinating.  Endocrine: Denies: hot or cold  intolerance, sweats, changes in hair or nails, polyuria, polydipsia. Musculoskeletal: Denies myalgias, back pain, joint swelling, arthralgias and gait problem.  Skin: Denies pallor, rash and wound.  Neurological: Denies dizziness, seizures, syncope, weakness, light-headedness, numbness and headaches.  Hematological: Denies adenopathy. Easy bruising, personal or family bleeding history  Psychiatric/Behavioral: Denies suicidal ideation, mood changes, confusion, nervousness, sleep disturbance and agitation    Physical Exam: Vitals:   07/22/21 0853 07/22/21 0859  BP: 140/90 130/90  Pulse: 70   Temp: 98.2 F (36.8 C)   TempSrc: Oral   SpO2: 98%   Weight: 162 lb 6.4 oz (73.7 kg)   Height: 5' 5.75" (1.67 m)     Body mass index is 26.41 kg/m.   Constitutional: NAD, calm, comfortable Eyes: PERRL, lids and conjunctivae normal ENMT: Mucous membranes are moist. Posterior pharynx clear of any exudate or lesions. Normal dentition. Tympanic membrane is pearly white, no erythema or bulging. Neck: normal, supple, no masses, no thyromegaly Respiratory: clear to auscultation bilaterally, no wheezing, no crackles. Normal respiratory effort. No accessory muscle use.  Cardiovascular: Regular rate and rhythm, strong systolic murmur.  No extremity edema. 2+ pedal pulses. No carotid bruits.  Abdomen: no tenderness, no masses palpated. No hepatosplenomegaly. Bowel sounds positive.  Musculoskeletal: no clubbing / cyanosis. No joint deformity upper and lower extremities. Good ROM, no contractures. Normal muscle tone.  Skin: no rashes, lesions, ulcers. No induration Neurologic: CN 2-12 grossly intact. Sensation intact, DTR normal. Strength 5/5 in all 4.  Psychiatric: Normal judgment and insight. Alert and oriented x 3. Normal mood.   Flowsheet Row Office Visit from 07/22/2021 in [facility]  PHQ-9 Total Score 0         Impression and Plan:  Encounter for preventive health  examination -Recommend routine eye and dental care. -Immunizations: All immunizations are up-to-date -Healthy lifestyle discussed in detail. -Labs to be updated today. -Colon cancer screening: 08/2019, 10-year follow-up -Breast cancer screening: 05/2021 -Cervical cancer screening: 05/2021 -Lung cancer screening: Not applicable -Prostate cancer screening: Not applicable -DEXA: Not applicable  Vitamin D deficiency  - Plan: VITAMIN D 25 Hydroxy (Vit-D Deficiency, Fractures)  Mixed hyperlipidemia  - Plan: Lipid panel  Primary hypertension  - Plan: CBC with Differential/Platelet, Comprehensive metabolic panel, Hemoglobin A1c, TSH, Vitamin B12, amLODipine (NORVASC) 5 MG tablet -I think it is time to start medication, 2 measurements in office today have been 130/90 and 140/90. -Start amlodipine 5 mg daily, she will return in 6 weeks for follow-up.  VSD (ventricular septal defect) -Noted    Patient Instructions  -Nice seeing you today!!  -Lab work today; will notify you once results are available.  -Start amlodipine 5 mg daily. Keep checking BP 3-4 times a week.  -Schedule follow up in 6-8 weeks.      Chaya Jan, MD [facility]   

## 2021-07-22 NOTE — Patient Instructions (Signed)
-  Nice seeing you today!!  -Lab work today; will notify you once results are available.  -Start amlodipine 5 mg daily. Keep checking BP 3-4 times a week.  -Schedule follow up in 6-8 weeks.

## 2021-08-03 ENCOUNTER — Encounter: Payer: Self-pay | Admitting: Internal Medicine

## 2021-09-06 ENCOUNTER — Ambulatory Visit: Payer: BC Managed Care – PPO | Admitting: Internal Medicine

## 2021-09-09 ENCOUNTER — Encounter: Payer: Self-pay | Admitting: Internal Medicine

## 2021-09-09 ENCOUNTER — Ambulatory Visit (INDEPENDENT_AMBULATORY_CARE_PROVIDER_SITE_OTHER): Payer: BC Managed Care – PPO | Admitting: Internal Medicine

## 2021-09-09 VITALS — BP 102/80 | HR 65 | Temp 98.0°F | Resp 99 | Wt 162.8 lb

## 2021-09-09 DIAGNOSIS — I10 Essential (primary) hypertension: Secondary | ICD-10-CM

## 2021-09-09 NOTE — Progress Notes (Signed)
Established Patient Office Visit     CC/Reason for Visit: Blood pressure follow-up  HPI: Elizabeth Lozano is a 51 y.o. female who is coming in today for the above mentioned reasons.  She was diagnosed with hypertension during her last visit.  She was started on amlodipine 5 mg daily which she is tolerating well.  She has noticed some very mild ankle edema towards the end of the day but it is minimal and resolves quickly with leg elevation.  She has been doing ambulatory blood pressure measurements with systolics in the 100s to 110s and diastolics in the 70s.  Past Medical/Surgical History: Past Medical History:  Diagnosis Date   Allergy    she has seen Dr. Eileen Stanford    Anemia    long term issues per pt   Anxiety    due to back pain    Asthma    Depression    Heart murmur    History of stress test    ETT-echo (9/14):  ST depression noted on ECGs; no wall motion abnormalities at peak exercise on echocardiogram   Hypertension    VSD (ventricular septal defect)    Cardiac CTA 05/2008: Membranous VSD, 8-9 mm in diameter, mild RVE, EF 64%, calcium score 0, no CAD. Last echo 02/2011: EF 55-60%, mild CAD, perimembranous VSD slightly more prominent but no RVE or pulmonary hypertension;  Echo (9/14): Small perimembranous VSD-no significant change since 02/2011; EF 55-60%, moderate LAE    Past Surgical History:  Procedure Laterality Date   cyst left wrist     dental implants     WISDOM TOOTH EXTRACTION      Social History:  reports that she has never smoked. She has never used smokeless tobacco. She reports current alcohol use of about 4.0 standard drinks of alcohol per week. She reports that she does not use drugs.  Allergies: Allergies  Allergen Reactions   Erythromycin Nausea And Vomiting   Iohexol Hives     Code: HIVES, Desc: RN AMY NOTICED HIVE ON STOMACH AFTER HEART SCAN. ALSO ONE ON BACK. NOT ITCHING UNTIL AFTER SHE KNEW OF HIVES. DR.NISHAN NOTIFIED. GIVEN BENADRYL., Onset Date:  [id number]    Tetracycline Hives    Family History:  Family History  Problem Relation Age of Onset   Sarcoidosis Mother    Colon polyps Mother    Heart attack Father    Hypertension Father    Hyperlipidemia Father    Melanoma Father    Cancer Father    Healthy Brother    Colon cancer Paternal Grandmother    Crohn's disease Neg Hx    Esophageal cancer Neg Hx    Rectal cancer Neg Hx    Stomach cancer Neg Hx      Current Outpatient Medications:    amLODipine (NORVASC) 5 MG tablet, Take 1 tablet (5 mg total) by mouth daily., Disp: 90 tablet, Rfl: 1   cetirizine (ZYRTEC) 10 MG tablet, Take 10 mg by mouth daily., Disp: , Rfl:    Cholecalciferol (VITAMIN D3 PO), Take by mouth., Disp: , Rfl:    levalbuterol (XOPENEX HFA) 45 MCG/ACT inhaler, Inhale 2 puffs into the lungs every 6 (six) hours as needed for wheezing., Disp: 1 each, Rfl: 12   Multiple Vitamin (MULTIVITAMIN) tablet, Take 1 tablet by mouth daily. Centrum Mini's for Women 50 Plus, Disp: , Rfl:   Review of Systems:  Constitutional: Denies fever, chills, diaphoresis, appetite change and fatigue.  HEENT: Denies photophobia, eye pain, redness,  hearing loss, ear pain, congestion, sore throat, rhinorrhea, sneezing, mouth sores, trouble swallowing, neck pain, neck stiffness and tinnitus.   Respiratory: Denies SOB, DOE, cough, chest tightness,  and wheezing.   Cardiovascular: Denies chest pain, palpitations. Gastrointestinal: Denies nausea, vomiting, abdominal pain, diarrhea, constipation, blood in stool and abdominal distention.  Genitourinary: Denies dysuria, urgency, frequency, hematuria, flank pain and difficulty urinating.  Endocrine: Denies: hot or cold intolerance, sweats, changes in hair or nails, polyuria, polydipsia. Musculoskeletal: Denies myalgias, back pain, joint swelling, arthralgias and gait problem.  Skin: Denies pallor, rash and wound.  Neurological: Denies dizziness, seizures, syncope, weakness, light-headedness,  numbness and headaches.  Hematological: Denies adenopathy. Easy bruising, personal or family bleeding history  Psychiatric/Behavioral: Denies suicidal ideation, mood changes, confusion, nervousness, sleep disturbance and agitation    Physical Exam: Vitals:   09/09/21 0831  BP: 102/80  Pulse: 65  Resp: (!) 99  Temp: 98 F (36.7 C)  TempSrc: Oral  Weight: 162 lb 12.8 oz (73.8 kg)    Body mass index is 26.48 kg/m.   Constitutional: NAD, calm, comfortable Eyes: PERRL, lids and conjunctivae normal ENMT: Mucous membranes are moist.  Respiratory: clear to auscultation bilaterally, no wheezing, no crackles. Normal respiratory effort. No accessory muscle use.  Cardiovascular: Regular rate and rhythm, strong murmur consistent with her history of VSD.  No extremity edema. 2+ pedal pulses. No carotid bruits.  Psychiatric: Normal judgment and insight. Alert and oriented x 3. Normal mood.    Impression and Plan:  Primary hypertension -Blood pressure is currently well controlled on amlodipine 5 mg daily.   Time spent:23 minutes reviewing chart, interviewing and examining patient and formulating plan of care.    Chaya Jan, MD [facility]

## 2021-10-16 IMAGING — MG MM DIGITAL DIAGNOSTIC UNILAT*R* W/ TOMO W/ CAD
8 series · 9 of 24 positions shown · non-contrast
Comparison: Previous exam(s).

CLINICAL DATA: 49-year-old female for further evaluation of
possible RIGHT breast asymmetry on screening mammogram.

EXAM:
DIGITAL DIAGNOSTIC UNILATERAL RIGHT MAMMOGRAM WITH TOMO AND CAD

[R MLO synth-2D]
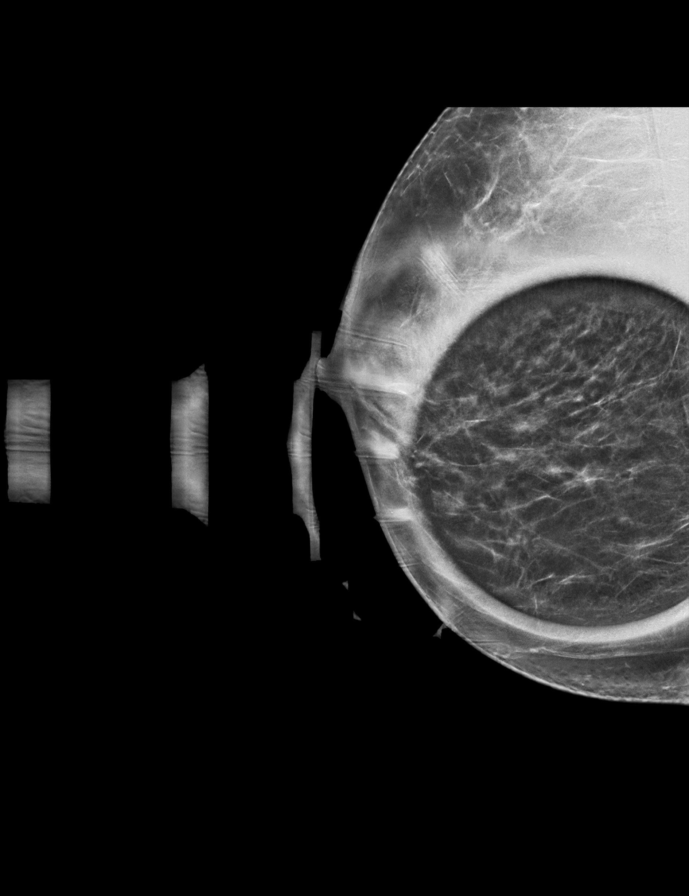

[R CC synth-2D (1 of 3)]
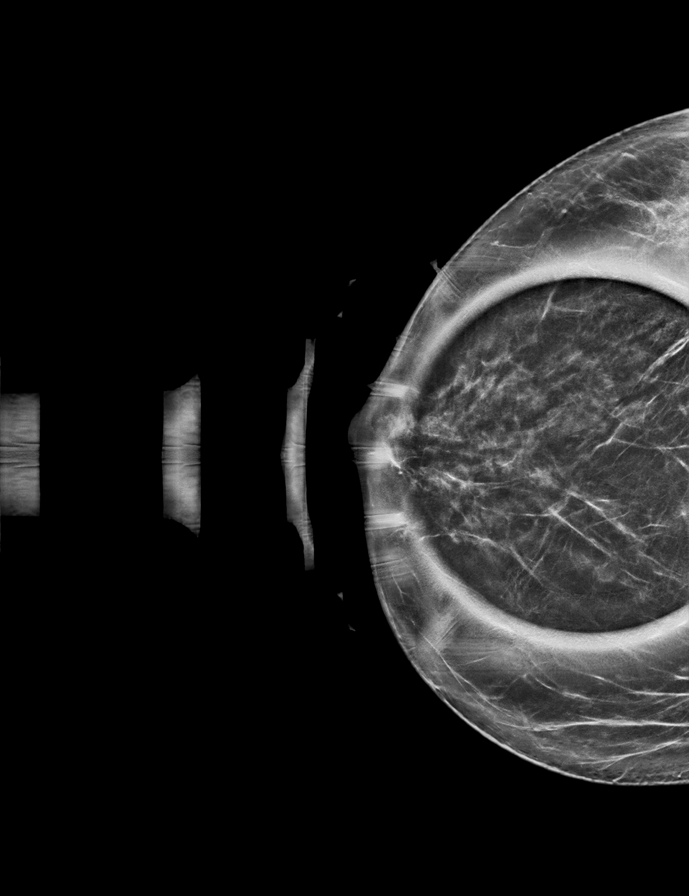

[R CC synth-2D (2 of 3)]
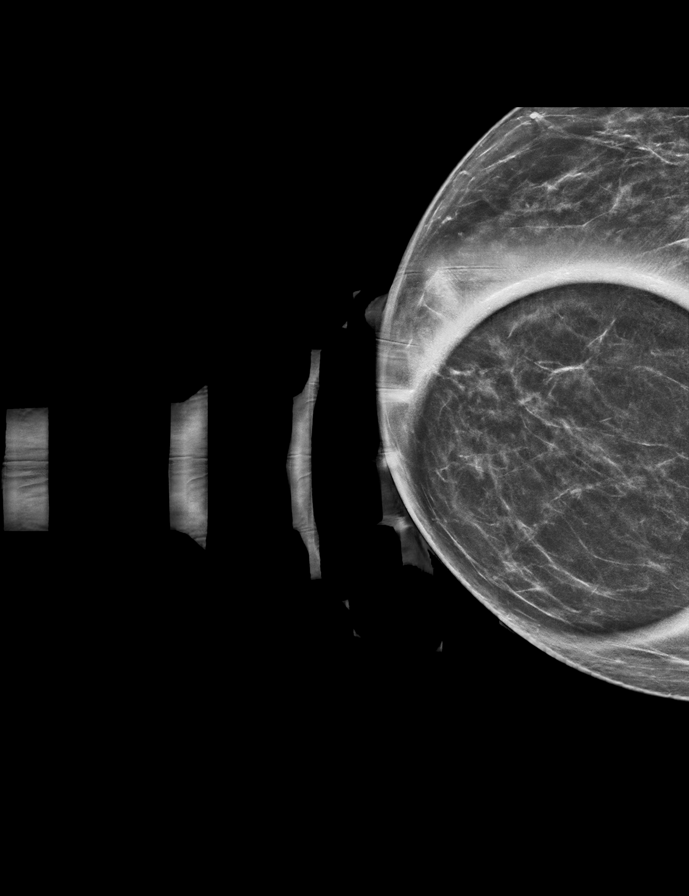

[R CC synth-2D (3 of 3)]
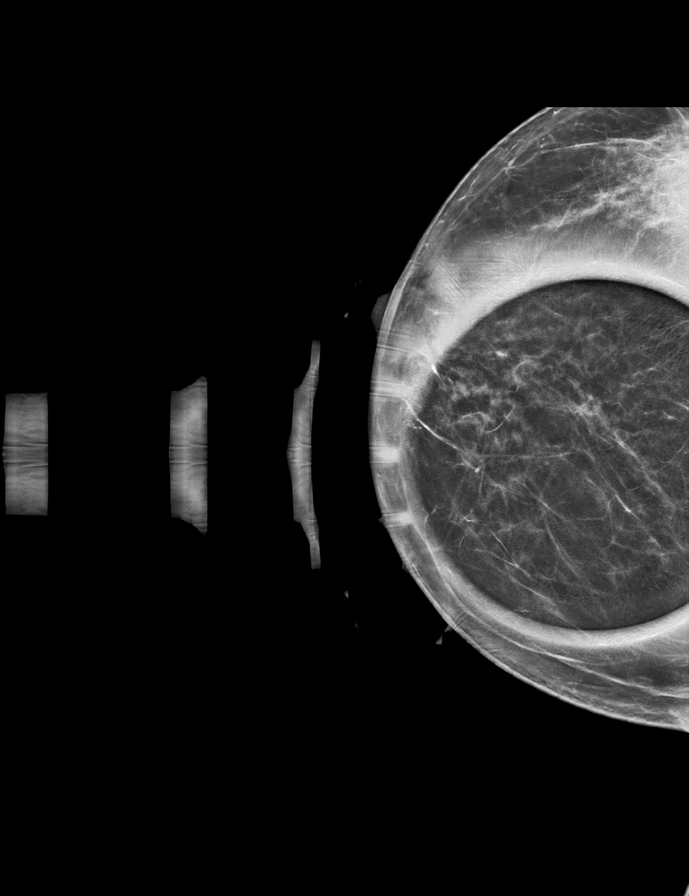

[R CC tomo · 2 of 65 frames shown (1 of 3)]
[frame 21/65]
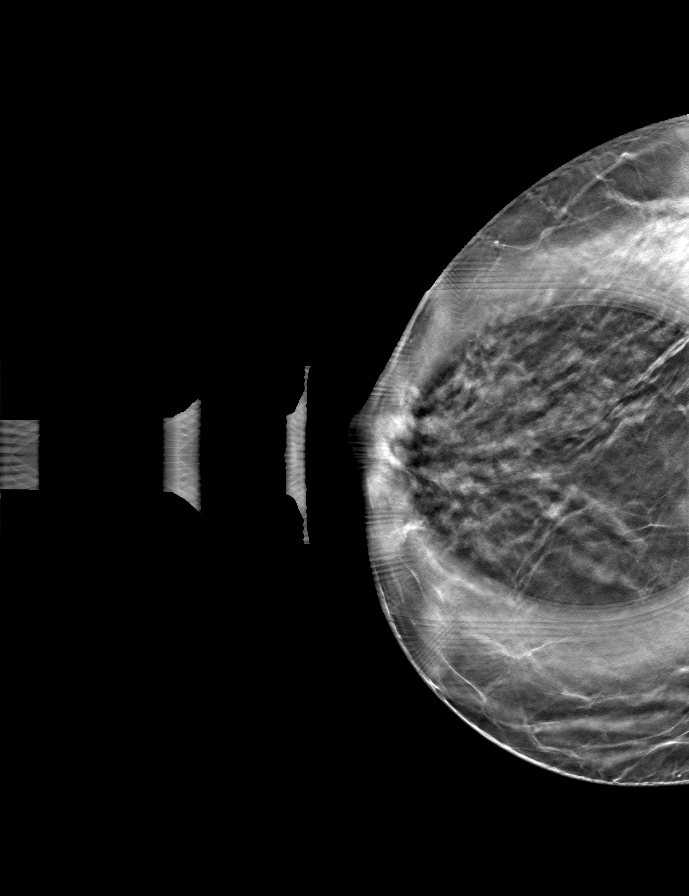
[frame 33/65]
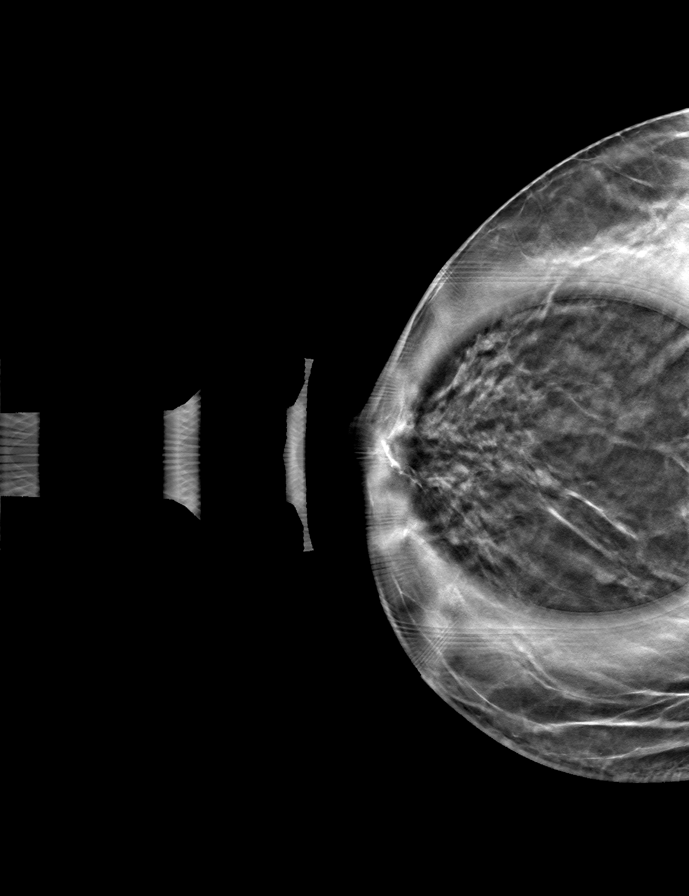

[R MLO tomo · tomo slice 29/57.0]
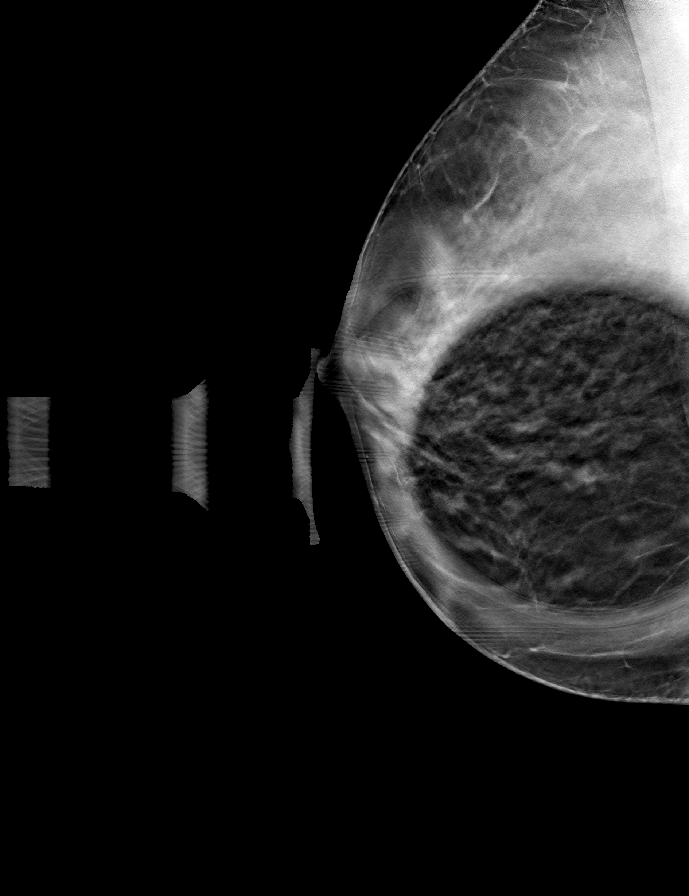

[R CC tomo (2 of 3) · tomo slice 30/59.0]
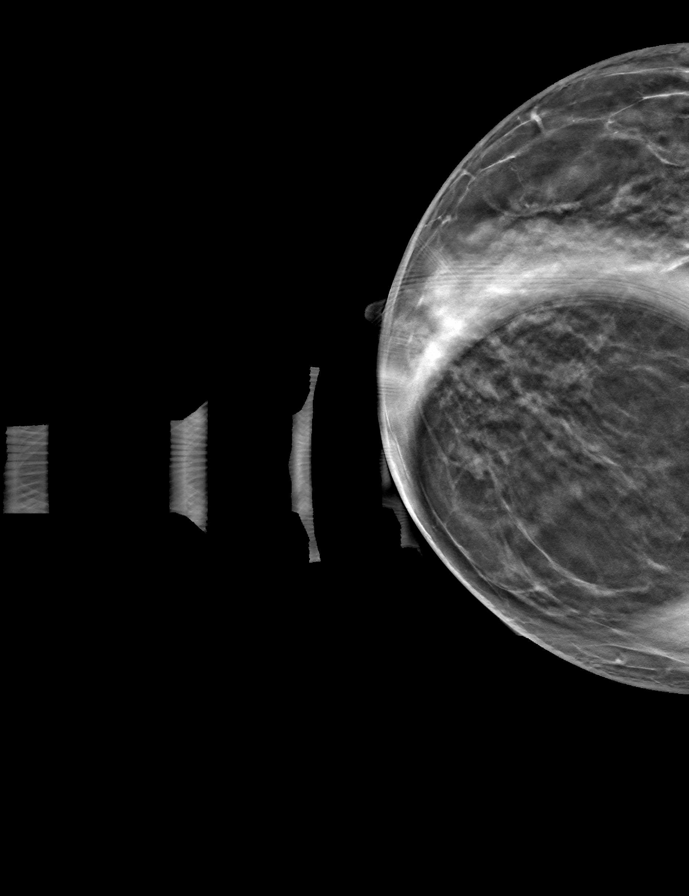

[R CC tomo (3 of 3) · tomo slice 30/59.0]
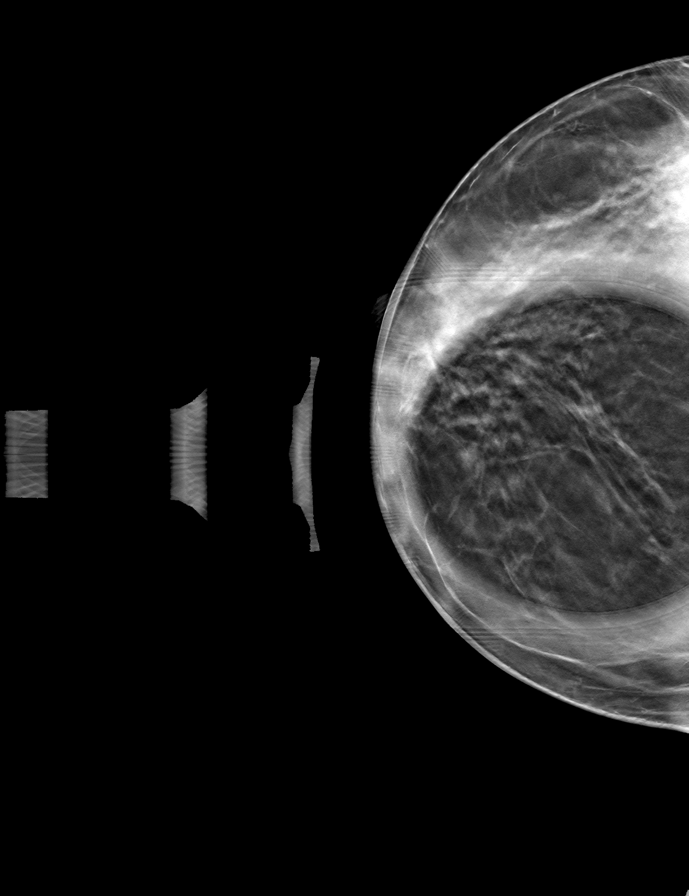

[9 of 24 positions shown; findings below may reference images not displayed]

ACR Breast Density Category c: The breast tissue is heterogeneously
dense, which may obscure small masses.
FINDINGS: 2D/3D spot compression views of the RIGHT breast demonstrate no
persistent abnormality at the site of the screening study finding.

Mammographic images were processed with CAD.
IMPRESSION: No persistent abnormality at the site of the screening study
finding.

RECOMMENDATION:
Bilateral screening mammogram in 1 year.

I have discussed the findings and recommendations with the patient.
If applicable, a reminder letter will be sent to the patient
regarding the next appointment.

BI-RADS CATEGORY  1: Negative.

## 2021-12-21 ENCOUNTER — Other Ambulatory Visit (INDEPENDENT_AMBULATORY_CARE_PROVIDER_SITE_OTHER): Payer: BC Managed Care – PPO

## 2021-12-21 ENCOUNTER — Ambulatory Visit: Payer: BC Managed Care – PPO | Admitting: Internal Medicine

## 2021-12-21 ENCOUNTER — Other Ambulatory Visit: Payer: Self-pay | Admitting: Internal Medicine

## 2021-12-21 DIAGNOSIS — E782 Mixed hyperlipidemia: Secondary | ICD-10-CM

## 2021-12-21 LAB — LIPID PANEL
Cholesterol: 241 mg/dL — ABNORMAL HIGH (ref 0–200)
HDL: 64.6 mg/dL (ref >39.00)
LDL Cholesterol: 163 mg/dL — ABNORMAL HIGH (ref 0–99)
NonHDL: 175.95
Total CHOL/HDL Ratio: 4
Triglycerides: 63 mg/dL (ref 0.0–149.0)
VLDL: 12.6 mg/dL (ref 0.0–40.0)

## 2021-12-29 ENCOUNTER — Other Ambulatory Visit: Payer: Self-pay | Admitting: *Deleted

## 2021-12-29 DIAGNOSIS — E782 Mixed hyperlipidemia: Secondary | ICD-10-CM

## 2022-01-03 ENCOUNTER — Other Ambulatory Visit: Payer: Self-pay | Admitting: Internal Medicine

## 2022-01-03 DIAGNOSIS — I10 Essential (primary) hypertension: Secondary | ICD-10-CM

## 2022-02-15 DIAGNOSIS — L821 Other seborrheic keratosis: Secondary | ICD-10-CM | POA: Diagnosis not present

## 2022-02-15 DIAGNOSIS — L814 Other melanin hyperpigmentation: Secondary | ICD-10-CM | POA: Diagnosis not present

## 2022-02-15 DIAGNOSIS — D485 Neoplasm of uncertain behavior of skin: Secondary | ICD-10-CM | POA: Diagnosis not present

## 2022-02-15 DIAGNOSIS — D2339 Other benign neoplasm of skin of other parts of face: Secondary | ICD-10-CM | POA: Diagnosis not present

## 2022-03-17 NOTE — Progress Notes (Signed)
CARDIOLOGY CONSULT NOTE       Patient ID: Elizabeth Lozano MRN: [id number] DOB/AGE: Feb 13, 1971 52 y.o.  Admit date: (Not on file) Referring Physician: Jerilee Hoh Primary Physician: Isaac Bliss, Rayford Halsted, MD Primary Cardiologist: New Reason for Consultation: Re establish care Last seen in person 2018  HPI:  52 y.o. seen at request of Dr Isaac Bliss to re establish care Last seen in person 2018. 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 2nd marriage to Inez Catalina will happen this spring Mom in [place] is sick ? Cancer and she will be traveling there some   She works out a lot at [employer] in [place] No dyspnea chest pain or palpitations Teeth in great shape  Primary stopped progesterone and LDL higher Discussed starting statin and getting calcium score  ROS All other systems reviewed and negative except as noted above  Past Medical History:  Diagnosis Date   Allergy    she has seen Dr. Tiajuana Amass    Anemia    long term issues per pt   Anxiety    due to back pain    Asthma    Depression    Heart murmur    History of stress test    ETT-echo (9/14):  ST depression noted on ECGs; no wall motion abnormalities at peak exercise on echocardiogram   Hypertension    VSD (ventricular septal defect)    Cardiac CTA 05/2008: Membranous VSD, 8-9 mm in diameter, mild RVE, EF 64%, calcium score 0, no CAD. Last echo 02/2011: EF 55-60%, mild CAD, perimembranous VSD slightly more prominent but no RVE or pulmonary hypertension;  Echo (9/14): Small  perimembranous VSD-no significant change since 02/2011; EF 55-60%, moderate LAE    Family History  Problem Relation Age of Onset   Sarcoidosis Mother    Colon polyps Mother    Heart attack Father    Hypertension Father    Hyperlipidemia Father    Melanoma Father    Cancer Father    Healthy Brother    Colon cancer Paternal Grandmother    Crohn's disease Neg Hx    Esophageal cancer Neg Hx    Rectal cancer Neg Hx    Stomach cancer Neg Hx     Social History   Socioeconomic History   Marital status: Divorced    Spouse name: Not on file   Number of children: Not on file   Years of education: Not on file   Highest education level: Bachelor's degree (e.g., BA, AB, BS)  Occupational History   Not on file  Tobacco Use   Smoking status: Never   Smokeless tobacco: Never  Vaping Use   Vaping Use: Never used  Substance and Sexual Activity   Alcohol use: Yes    Alcohol/week: 4.0 standard drinks of alcohol    Types: 4 Standard drinks or equivalent per week    Comment: one beer per day per pt   Drug use: No   Sexual activity: Never  Other Topics Concern  Not on file  Social History Narrative   Not on file   Social Determinants of Health   Financial Resource Strain: Low Risk  (09/08/2021)   Overall Financial Resource Strain (CARDIA)    Difficulty of Paying Living Expenses: Not hard at all  Food Insecurity: No Food Insecurity (09/08/2021)   Hunger Vital Sign    Worried About Running Out of Food in the Last Year: Never true    Ran Out of Food in the Last Year: Never true  Transportation Needs: No Transportation Needs (09/08/2021)   PRAPARE - Hydrologist (Medical): No    Lack of Transportation (Non-Medical): No  Physical Activity: Sufficiently Active (09/08/2021)   Exercise Vital Sign    Days of Exercise per Week: 3 days    Minutes of Exercise per Session: 60 min  Stress: No Stress Concern Present (09/08/2021)   [place]    Feeling of Stress : Not at all  Social Connections: Moderately Integrated (09/08/2021)   Social Connection and Isolation Panel [NHANES]    Frequency of Communication with Friends and Family: More than three times a week    Frequency of Social Gatherings with Friends and Family: Three times a week    Attends Religious Services: 1 to 4 times per year    Active Member of Clubs or Organizations: Yes    Attends Music therapist: More than 4 times per year    Marital Status: Divorced  Human resources officer Violence: Not on file    Past Surgical History:  Procedure Laterality Date   cyst left wrist     dental implants     WISDOM TOOTH EXTRACTION        Current Outpatient Medications:    amLODipine (NORVASC) 5 MG tablet, TAKE 1 TABLET (5 MG TOTAL) BY MOUTH DAILY., Disp: 90 tablet, Rfl: 1   cetirizine (ZYRTEC) 10 MG tablet, Take 10 mg by mouth daily., Disp: , Rfl:    Cholecalciferol (VITAMIN D3 PO), Take by mouth., Disp: , Rfl:    levalbuterol (XOPENEX HFA) 45 MCG/ACT inhaler, Inhale 2 puffs into the lungs every 6 (six) hours as needed for wheezing., Disp: 1 each, Rfl: 12   Multiple Vitamin (MULTIVITAMIN) tablet, Take 1 tablet by mouth daily. Centrum Mini's for Women 50 Plus, Disp: , Rfl:     Physical Exam: Blood pressure 134/80, pulse 62, height 5\' 6"  (1.676 m), weight 162 lb (73.5 kg).    Affect appropriate Healthy:  appears stated age 32: normal Neck supple with no adenopathy JVP normal no bruits no thyromegaly Lungs clear with no wheezing and good diaphragmatic motion Heart:  S1/S2 VSD murmur no RV lift/heave Abdomen: benighn, BS positve, no tenderness, no AAA no bruit.  No HSM or HJR Distal pulses intact with no bruits No edema Neuro non-focal Skin warm and dry No muscular weakness   Labs:   Lab Results  Component Value Date   WBC 5.3 07/22/2021   HGB 13.6 07/22/2021   HCT 41.1 07/22/2021   MCV 89.9 07/22/2021    PLT 229.0 07/22/2021   No results for input(s): "NA", "K", "CL", "CO2", "BUN", "CREATININE", "CALCIUM", "PROT", "BILITOT", "ALKPHOS", "ALT", "AST", "GLUCOSE" in the last 168 hours.  Invalid input(s): "LABALBU" No results found for: "CKTOTAL", "CKMB", "CKMBINDEX", "TROPONINI"  Lab Results  Component Value Date   CHOL 241 (H) 12/21/2021   CHOL 205 (H) 07/22/2021   CHOL 165 06/01/2020   Lab Results  Component Value Date   HDL 64.60 12/21/2021   HDL 60.50 07/22/2021   HDL 55.90 06/01/2020   Lab Results  Component Value Date   LDLCALC 163 (H) 12/21/2021   LDLCALC 132 (H) 07/22/2021   LDLCALC 97 06/01/2020   Lab Results  Component Value Date   TRIG 63.0 12/21/2021   TRIG 61.0 07/22/2021   TRIG 58.0 06/01/2020   Lab Results  Component Value Date   CHOLHDL 4 12/21/2021   CHOLHDL 3 07/22/2021   CHOLHDL 3 06/01/2020   No results found for: "LDLDIRECT"    Radiology: No results found.  EKG: SR RAD, ICRBBB 03/24/2022    ASSESSMENT AND PLAN:   VSD: restrictive peri membranous Loud murmur normal RV in past will update TTE Bicuspid AV:  no AS/AR in 2018 see above update TTE  Palpitations :  benign related to stress  Asthma/Allergies : Zyrtec and xopenex as needed  HTN:  improved on norvasc  HLD:  getting labs with primary next week Calcium score Low threshold to start statin   TTE Calcium score   F/U in a year   Signed: Jenkins Rouge 03/24/2022, 9:18 AM

## 2022-03-24 ENCOUNTER — Ambulatory Visit: Payer: BC Managed Care – PPO | Attending: Cardiovascular Disease | Admitting: Cardiovascular Disease

## 2022-03-24 ENCOUNTER — Encounter: Payer: Self-pay | Admitting: Cardiovascular Disease

## 2022-03-24 VITALS — BP 134/80 | HR 62 | Ht 66.0 in | Wt 162.0 lb

## 2022-03-24 DIAGNOSIS — Q231 Congenital insufficiency of aortic valve: Secondary | ICD-10-CM

## 2022-03-24 DIAGNOSIS — Q210 Ventricular septal defect: Secondary | ICD-10-CM | POA: Diagnosis not present

## 2022-03-24 DIAGNOSIS — E782 Mixed hyperlipidemia: Secondary | ICD-10-CM

## 2022-03-24 DIAGNOSIS — I10 Essential (primary) hypertension: Secondary | ICD-10-CM | POA: Diagnosis not present

## 2022-03-24 NOTE — Patient Instructions (Addendum)
Medication Instructions:  Your physician recommends that you continue on your current medications as directed. Please refer to the Current Medication list given to you today.  *If you need a refill on your cardiac medications before your next appointment, please call your pharmacy*  Lab Work: If you have labs (blood work) drawn today and your tests are completely normal, you will receive your results only by: [place] (if you have MyChart) OR A paper copy in the mail If you have any lab test that is abnormal or we need to change your treatment, we will call you to review the results.  Testing/Procedures: Your physician has requested that you have an echocardiogram. Echocardiography is a painless test that uses sound waves to create images of your heart. It provides your doctor with information about the size and shape of your heart and how well your heart's chambers and valves are working. This procedure takes approximately one hour. There are no restrictions for this procedure. Please do NOT wear cologne, perfume, aftershave, or lotions (deodorant is allowed). Please arrive 15 minutes prior to your appointment time.  Cardiac CT scanning for calcium score, (CAT scanning), is a noninvasive, special x-ray that produces cross-sectional images of the body using x-rays and a computer. CT scans help physicians diagnose and treat medical conditions. For some CT exams, a contrast material is used to enhance visibility in the area of the body being studied. CT scans provide greater clarity and reveal more details than regular x-ray exams.  Follow-Up: At [facility], you and your health needs are our priority.  As part of our continuing mission to provide you with exceptional heart care, we have created designated Provider Care Teams.  These Care Teams include your primary Cardiologist (physician) and Advanced Practice Providers (APPs -  Physician Assistants and Nurse Practitioners) who all  work together to provide you with the care you need, when you need it.  We recommend signing up for the patient portal called "MyChart".  Sign up information is provided on this After Visit Summary.  MyChart is used to connect with patients for Virtual Visits (Telemedicine).  Patients are able to view lab/test results, encounter notes, upcoming appointments, etc.  Non-urgent messages can be sent to your provider as well.   To learn more about what you can do with MyChart, go to [email or web address].    Your next appointment:   1 year(s)  Provider:   Jenkins Rouge, MD

## 2022-03-31 DIAGNOSIS — Z6826 Body mass index (BMI) 26.0-26.9, adult: Secondary | ICD-10-CM | POA: Diagnosis not present

## 2022-03-31 DIAGNOSIS — Z1231 Encounter for screening mammogram for malignant neoplasm of breast: Secondary | ICD-10-CM | POA: Diagnosis not present

## 2022-03-31 DIAGNOSIS — Z01419 Encounter for gynecological examination (general) (routine) without abnormal findings: Secondary | ICD-10-CM | POA: Diagnosis not present

## 2022-04-01 ENCOUNTER — Other Ambulatory Visit (INDEPENDENT_AMBULATORY_CARE_PROVIDER_SITE_OTHER): Payer: BC Managed Care – PPO

## 2022-04-01 ENCOUNTER — Other Ambulatory Visit: Payer: BC Managed Care – PPO

## 2022-04-01 DIAGNOSIS — Q210 Ventricular septal defect: Secondary | ICD-10-CM

## 2022-04-01 DIAGNOSIS — I10 Essential (primary) hypertension: Secondary | ICD-10-CM

## 2022-04-01 DIAGNOSIS — Q231 Congenital insufficiency of aortic valve: Secondary | ICD-10-CM

## 2022-04-01 DIAGNOSIS — E782 Mixed hyperlipidemia: Secondary | ICD-10-CM

## 2022-04-01 LAB — LIPID PANEL
Cholesterol: 203 mg/dL — ABNORMAL HIGH (ref 0–200)
HDL: 61.5 mg/dL (ref >39.00)
LDL Cholesterol: 128 mg/dL — ABNORMAL HIGH (ref 0–99)
NonHDL: 141.76
Total CHOL/HDL Ratio: 3
Triglycerides: 70 mg/dL (ref 0.0–149.0)
VLDL: 14 mg/dL (ref 0.0–40.0)

## 2022-04-19 ENCOUNTER — Encounter: Payer: Self-pay | Admitting: Internal Medicine

## 2022-05-03 ENCOUNTER — Ambulatory Visit (HOSPITAL_COMMUNITY): Payer: BC Managed Care – PPO | Attending: Cardiovascular Disease

## 2022-05-03 DIAGNOSIS — Q210 Ventricular septal defect: Secondary | ICD-10-CM | POA: Diagnosis not present

## 2022-05-03 DIAGNOSIS — Q231 Congenital insufficiency of aortic valve: Secondary | ICD-10-CM | POA: Diagnosis not present

## 2022-05-03 LAB — ECHOCARDIOGRAM COMPLETE
AR max vel: 2.58 cm2
AV Area VTI: 2.69 cm2
AV Area mean vel: 2.61 cm2
AV Mean grad: 6.5 mmHg
AV Peak grad: 13 mmHg
Ao pk vel: 1.81 m/s
Area-P 1/2: 4.56 cm2
S' Lateral: 3.2 cm

## 2022-05-08 NOTE — H&P (View-Only) (Signed)
CARDIOLOGY CONSULT NOTE       Patient ID: Elizabeth Lozano MRN: [id number] DOB/AGE: 07/13/1970 52 y.o.  Admit date: (Not on file) Referring Physician: Hernandez Primary Physician: Elizabeth Lozano Lozano, Elizabeth Y, MD Primary Cardiologist: Elizabeth Lozano   HPI:  52 y.o. reestablished care with me 03/24/22 Seen in person 2018. 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 2nd marriage to Elizabeth Lozano will happen this spring Mom in [place] is sick ? Cancer and she will be traveling there some   She works out a lot at [employer] in [place] No dyspnea chest pain or palpitations Teeth in great shape  Primary stopped progesterone and LDL higher Discussed starting statin and getting calcium score  TTE done 05/03/22 reviewed and compared to one done 04/29/16 She appears to have further enlargement of her membranous septum with bigger left to right shunting Bicuspid AV with no significant AR/AS mild MR and normal RV size and function   Discussed issues regarding possible right and left cath for shunt evaluation She is allergic to contrast but has been only hives. Discussed possibility of Qp/Qs with cardiac MRI   Spoke with Elizabeth Lozano and need to make sure cors ok rather than due cardiac MRI/cardiac CT feel right and left cath with shunt run best. Discussed with patient if Elizabeth Lozano needs more anatomic information than TTE provides can consider TEE  She has a contrast allergy with hives and will get a 13 hour prep  Shared Decision Making/Informed Consent The risks [stroke (1 in 1000),  death (1 in 1000), kidney failure [usually temporary] (1 in 500), bleeding (1 in 200), allergic reaction [possibly serious] (1 in 200)], benefits (diagnostic support and management of coronary artery disease) and alternatives of a cardiac catheterization were discussed in detail with Elizabeth Lozano and she is willing to proceed.   ROS All other systems reviewed and negative except as noted above  Past Medical History:  Diagnosis Date   Allergy    she has seen Elizabeth Lozano    Anemia    long term issues per pt   Anxiety    due to back pain    Asthma    Depression    Heart murmur    History of stress test    ETT-echo (9/14):  ST depression noted on ECGs; no wall motion abnormalities at peak exercise on echocardiogram   Hypertension    VSD (ventricular septal defect)    Cardiac CTA 05/2008: Membranous VSD, 8-9 mm in diameter, mild RVE, EF 64%, calcium score 0, no CAD. Last echo 02/2011: EF 55-60%, mild CAD, perimembranous VSD slightly more prominent but no RVE or pulmonary hypertension;  Echo (9/14): Small perimembranous VSD-no significant change since 02/2011; EF 55-60%, moderate LAE    Family History  Problem Relation Age of Onset   Sarcoidosis Mother    Colon polyps Mother    Heart attack Father    Hypertension Father    Hyperlipidemia Father    Melanoma Father    Cancer Father      Healthy Brother    Colon cancer Paternal Grandmother    Crohn's disease Neg Hx    Esophageal cancer Neg Hx    Rectal cancer Neg Hx    Stomach cancer Neg Hx     Social History   Socioeconomic History   Marital status: Divorced    Spouse name: Not on file   Number of children: Not on file   Years of education: Not on file   Highest education level: Bachelor's degree (e.g., BA, AB, BS)  Occupational History   Not on file  Tobacco Use   Smoking status: Never   Smokeless tobacco: Never  Vaping Use   Vaping Use: Never used  Substance and Sexual Activity   Alcohol use: Yes    Alcohol/week: 4.0  standard drinks of alcohol    Types: 4 Standard drinks or equivalent per week    Comment: one beer per day per pt   Drug use: No   Sexual activity: Never  Other Topics Concern   Not on file  Social History Narrative   Not on file   Social Determinants of Health   Financial Resource Strain: Low Risk  (09/08/2021)   Overall Financial Resource Strain (CARDIA)    Difficulty of Paying Living Expenses: Not hard at all  Food Insecurity: No Food Insecurity (09/08/2021)   Hunger Vital Sign    Worried About Running Out of Food in the Last Year: Never true    Ran Out of Food in the Last Year: Never true  Transportation Needs: No Transportation Needs (09/08/2021)   PRAPARE - Transportation    Lack of Transportation (Medical): No    Lack of Transportation (Non-Medical): No  Physical Activity: Sufficiently Active (09/08/2021)   Exercise Vital Sign    Days of Exercise per Week: 3 days    Minutes of Exercise per Session: 60 min  Stress: No Stress Concern Present (09/08/2021)   [employer] - Occupational Stress Questionnaire    Feeling of Stress : Not at all  Social Connections: Moderately Integrated (09/08/2021)   Social Connection and Isolation Panel [NHANES]    Frequency of Communication with Friends and Family: More than three times a week    Frequency of Social Gatherings with Friends and Family: Three times a week    Attends Religious Services: 1 to 4 times per year    Active Member of Clubs or Organizations: Yes    Attends Club or Organization Meetings: More than 4 times per year    Marital Status: Divorced  Intimate Partner Violence: Not on file    Past Surgical History:  Procedure Laterality Date   cyst left wrist     dental implants     WISDOM TOOTH EXTRACTION        Current Outpatient Medications:    amLODipine (NORVASC) 5 MG tablet, TAKE 1 TABLET (5 MG TOTAL) BY MOUTH DAILY., Disp: 90 tablet, Rfl: 1   cetirizine (ZYRTEC) 10 MG tablet, Take 10 mg by  mouth daily., Disp: , Rfl:    Cholecalciferol (VITAMIN D3 PO), Take by mouth., Disp: , Rfl:    levalbuterol (XOPENEX HFA) 45 MCG/ACT inhaler, Inhale 2 puffs into the lungs every 6 (six) hours as needed for wheezing., Disp: 1 each, Rfl: 12   Multiple Vitamin (MULTIVITAMIN) tablet, Take 1 tablet by mouth daily. Centrum Mini's for Women 50 Plus, Disp: , Rfl:     Physical Exam: Blood pressure 122/62, pulse 70, height 5' 6" (1.676 m), weight 164 lb 12.8   oz (74.8 kg), SpO2 99 %.    Affect appropriate Healthy:  appears stated age HEENT: normal Neck supple with no adenopathy JVP normal no bruits no thyromegaly Lungs clear with no wheezing and good diaphragmatic motion Heart:  S1/S2 VSD murmur no RV lift/heave Abdomen: benighn, BS positve, no tenderness, no AAA no bruit.  No HSM or HJR Distal pulses intact with no bruits No edema Neuro non-focal Skin warm and dry No muscular weakness   Labs:   Lab Results  Component Value Date   WBC 5.3 07/22/2021   HGB 13.6 07/22/2021   HCT 41.1 07/22/2021   MCV 89.9 07/22/2021   PLT 229.0 07/22/2021   No results for input(s): "NA", "K", "CL", "CO2", "BUN", "CREATININE", "CALCIUM", "PROT", "BILITOT", "ALKPHOS", "ALT", "AST", "GLUCOSE" in the last 168 hours.  Invalid input(s): "LABALBU" No results found for: "CKTOTAL", "CKMB", "CKMBINDEX", "TROPONINI"  Lab Results  Component Value Date   CHOL 203 (H) 04/01/2022   CHOL 241 (H) 12/21/2021   CHOL 205 (H) 07/22/2021   Lab Results  Component Value Date   HDL 61.50 04/01/2022   HDL 64.60 12/21/2021   HDL 60.50 07/22/2021   Lab Results  Component Value Date   LDLCALC 128 (H) 04/01/2022   LDLCALC 163 (H) 12/21/2021   LDLCALC 132 (H) 07/22/2021   Lab Results  Component Value Date   TRIG 70.0 04/01/2022   TRIG 63.0 12/21/2021   TRIG 61.0 07/22/2021   Lab Results  Component Value Date   CHOLHDL 3 04/01/2022   CHOLHDL 4 12/21/2021   CHOLHDL 3 07/22/2021   No results found for:  "LDLDIRECT"    Radiology: ECHOCARDIOGRAM COMPLETE  Result Date: 05/03/2022    ECHOCARDIOGRAM REPORT   Patient Name:   Cayden Dauphinais     Date of Exam: 05/03/2022 Medical Rec #:  [id number]     Height:       66.0 in Accession #:    [id number]    Weight:       162.0 lb Date of Birth:  05/15/1970      BSA:          1.828 m Patient Age:    52 years      BP:           134/80 mmHg Patient Gender: F             HR:           63 bpm. Exam Location:  [address] Procedure: 2D Echo, Cardiac Doppler, Color Doppler and Strain Analysis Indications:    Q23.1 Bicuspid Aortic Valve  History:        Patient has prior history of Echocardiogram examinations, most                 recent 04/29/2016. Aortic Valve Disease, Signs/Symptoms:Murmur;                 Risk Factors:Family History of Coronary Artery Disease and                 Hypertension. Perimembranous VSD, Bicuspid Aortic Valve.  Sonographer:    Bethany Mcmahill RDCS Referring Phys: Eddi Hymes C Evanthia Maund IMPRESSIONS  1. Left ventricular ejection fraction, by estimation, is 60 to 65%. The left ventricle has normal function. The left ventricle has no regional wall motion abnormalities. Left ventricular diastolic parameters are consistent with Grade II diastolic dysfunction (pseudonormalization).  2. Right ventricular systolic function is normal. The right ventricular size is normal.  3. Left atrial size   was severely dilated.  4. Large perimembranous VSD. Significant peak gradient 202 mmHg. cannot exclude a small PFO.  5. Mild mitral valve regurgitation.  6. The aortic valve is bicuspid. Aortic valve regurgitation is not visualized.  7. The inferior vena cava is normal in size with greater than 50% respiratory variability, suggesting right atrial pressure of 3 mmHg. Conclusion(s)/Recommendation(s): Large perimembranous VSD, recommend a cardiac MRI. FINDINGS  Left Ventricle: Left ventricular ejection fraction, by estimation, is 60 to 65%. The left ventricle has normal function. The left  ventricle has no regional wall motion abnormalities. The left ventricular internal cavity size was normal in size. There is  no left ventricular hypertrophy. Left ventricular diastolic parameters are consistent with Grade II diastolic dysfunction (pseudonormalization). Right Ventricle: The right ventricular size is normal. Right ventricular systolic function is normal. Left Atrium: Left atrial size was severely dilated. Right Atrium: Right atrial size was normal in size. Pericardium: There is no evidence of pericardial effusion. Mitral Valve: Mild mitral valve regurgitation. Tricuspid Valve: Tricuspid valve regurgitation is trivial. Aortic Valve: The aortic valve is bicuspid. Aortic valve regurgitation is not visualized. Aortic valve mean gradient measures 6.5 mmHg. Aortic valve peak gradient measures 13.0 mmHg. Aortic valve area, by VTI measures 2.69 cm. Pulmonic Valve: Pulmonic valve regurgitation is mild. Aorta: The aortic root and ascending aorta are structurally normal, with no evidence of dilitation. Venous: The inferior vena cava is normal in size with greater than 50% respiratory variability, suggesting right atrial pressure of 3 mmHg. IAS/Shunts: Cannot exclude a small PFO.  LEFT VENTRICLE PLAX 2D LVIDd:         5.60 cm   Diastology LVIDs:         3.20 cm   LV e' medial:    5.87 cm/s LV PW:         0.80 cm   LV E/e' medial:  20.4 LV IVS:        1.00 cm   LV e' lateral:   7.84 cm/s LVOT diam:     2.40 cm   LV E/e' lateral: 15.3 LV SV:         105 LV SV Index:   57        2D Longitudinal Strain LVOT Area:     4.52 cm  2D Strain GLS (A2C):   -27.9 %                          2D Strain GLS (A3C):   -19.7 %                          2D Strain GLS (A4C):   -22.1 %                          2D Strain GLS Avg:     -23.2 %                           3D Volume EF:                          3D EF:        72 %                          LV EDV:         161 ml                          LV ESV:       45 ml                           LV SV:        117 ml RIGHT VENTRICLE RV Basal diam:  3.20 cm RV S prime:     10.80 cm/s TAPSE (M-mode): 1.6 cm RVSP:           20.3 mmHg LEFT ATRIUM             Index        RIGHT ATRIUM           Index LA diam:        4.40 cm 2.41 cm/m   RA Pressure: 3.00 mmHg LA Vol (A2C):   77.5 ml 42.38 ml/m  RA Area:     13.60 cm LA Vol (A4C):   96.4 ml 52.72 ml/m  RA Volume:   33.90 ml  18.54 ml/m LA Biplane Vol: 89.1 ml 48.73 ml/m  AORTIC VALVE AV Area (Vmax):    2.58 cm AV Area (Vmean):   2.61 cm AV Area (VTI):     2.69 cm AV Vmax:           180.50 cm/s AV Vmean:          115.500 cm/s AV VTI:            0.390 m AV Peak Grad:      13.0 mmHg AV Mean Grad:      6.5 mmHg LVOT Vmax:         103.00 cm/s LVOT Vmean:        66.750 cm/s LVOT VTI:          0.232 m LVOT/AV VTI ratio: 0.60  AORTA Ao Root diam: 2.90 cm Ao Asc diam:  2.80 cm MITRAL VALVE                TRICUSPID VALVE MV Area (PHT): cm          TR Peak grad:   17.3 mmHg MV Decel Time: 167 msec     TR Vmax:        208.00 cm/s MV E velocity: 120.00 cm/s  Estimated RAP:  3.00 mmHg MV A velocity: 76.40 cm/s   RVSP:           20.3 mmHg MV E/A ratio:  1.57                             SHUNTS                             Systemic VTI:  0.23 m                             Systemic Diam: 2.40 cm Mary Branch Electronically signed by Mary Branch Signature Date/Time: 05/03/2022/12:26:04 PM    Final     EKG: SR RAD, [id number] 05/12/2022    ASSESSMENT AND PLAN:   VSD: lost to f/u since 2018 when VSD seemed restrictive with normal RV TTE 05/03/22 concerning for further degeneration of membranous septum with large shunt She is allergic to contrast and   is concerned about iv angio contrast Discussed with Elizabeth Lozano and will arrange right and left cath with shunt run Then refer to CVTS  Bicuspid AV:  no AS/AR TTE 05/03/22  Suspect her valve will not need to be touched during any VSD patch repair closure  Palpitations :  benign related to stress  Asthma/Allergies : Zyrtec and  xopenex as needed  HTN:  improved on norvasc  HLD:  getting labs with primary next week Calcium score Low threshold to start statin   Right and left cath Labs today Refer to Elizabeth Lozano CVTS post cath Lab called orders written   F/U post cath  Signed: Kalden Wanke 05/12/2022, 3:21 PM   

## 2022-05-08 NOTE — Progress Notes (Unsigned)
CARDIOLOGY CONSULT NOTE       Patient ID: Elizabeth Lozano MRN: [id number] DOB/AGE: January 12, 1971 52 y.o.  Admit date: (Not on file) Referring Physician: Jerilee Lozano Primary Physician: Elizabeth Lozano, Elizabeth Halsted, MD Primary Cardiologist: Elizabeth Lozano   HPI:  52 y.o. reestablished care with me 03/24/22 Seen in person 2018. 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 2nd marriage to Elizabeth Lozano will happen this spring Mom in [place] is sick ? Cancer and she will be traveling there some   She works out a lot at [employer] in [place] No dyspnea chest pain or palpitations Teeth in great shape  Primary stopped progesterone and LDL higher Discussed starting statin and getting calcium score  TTE done 05/03/22 reviewed and compared to one done 04/29/16 She appears to have further enlargement of her membranous septum with bigger left to right shunting Bicuspid AV with no significant AR/AS mild MR and normal RV size and function   Discussed issues regarding possible right and left cath for shunt evaluation She is allergic to contrast but has been only hives. Discussed possibility of Qp/Qs with cardiac MRI   Spoke with Elizabeth Lozano and need to make sure cors ok rather than due cardiac MRI/cardiac CT feel right and left cath with shunt run best. Discussed with patient if Elizabeth Lozano needs more anatomic information than TTE provides can consider TEE  She has a contrast allergy with hives and will get a 13 hour prep  Shared Decision Making/Informed Consent The risks [stroke (1 in 1000),  death (1 in 1000), kidney failure [usually temporary] (1 in 500), bleeding (1 in 200), allergic reaction [possibly serious] (1 in 200)], benefits (diagnostic support and management of coronary artery disease) and alternatives of a cardiac catheterization were discussed in detail with Elizabeth Lozano and she is willing to proceed.   ROS All other systems reviewed and negative except as noted above  Past Medical History:  Diagnosis Date   Allergy    she has seen Elizabeth. Tiajuana Lozano    Anemia    long term issues per pt   Anxiety    due to back pain    Asthma    Depression    Heart murmur    History of stress test    ETT-echo (9/14):  ST depression noted on ECGs; no wall motion abnormalities at peak exercise on echocardiogram   Hypertension    VSD (ventricular septal defect)    Cardiac CTA 05/2008: Membranous VSD, 8-9 mm in diameter, mild RVE, EF 64%, calcium score 0, no CAD. Last echo 02/2011: EF 55-60%, mild CAD, perimembranous VSD slightly more prominent but no RVE or pulmonary hypertension;  Echo (9/14): Small perimembranous VSD-no significant change since 02/2011; EF 55-60%, moderate LAE    Family History  Problem Relation Age of Onset   Sarcoidosis Mother    Colon polyps Mother    Heart attack Father    Hypertension Father    Hyperlipidemia Father    Melanoma Father    Cancer Father  Healthy Brother    Colon cancer Paternal Grandmother    Crohn's disease Neg Hx    Esophageal cancer Neg Hx    Rectal cancer Neg Hx    Stomach cancer Neg Hx     Social History   Socioeconomic History   Marital status: Divorced    Spouse name: Not on file   Number of children: Not on file   Years of education: Not on file   Highest education level: Bachelor's degree (e.g., BA, AB, BS)  Occupational History   Not on file  Tobacco Use   Smoking status: Never   Smokeless tobacco: Never  Vaping Use   Vaping Use: Never used  Substance and Sexual Activity   Alcohol use: Yes    Alcohol/week: 4.0  standard drinks of alcohol    Types: 4 Standard drinks or equivalent per week    Comment: one beer per day per pt   Drug use: No   Sexual activity: Never  Other Topics Concern   Not on file  Social History Narrative   Not on file   Social Determinants of Health   Financial Resource Strain: Low Risk  (09/08/2021)   Overall Financial Resource Strain (CARDIA)    Difficulty of Paying Living Expenses: Not hard at all  Food Insecurity: No Food Insecurity (09/08/2021)   Hunger Vital Sign    Worried About Running Out of Food in the Last Year: Never true    Ran Out of Food in the Last Year: Never true  Transportation Needs: No Transportation Needs (09/08/2021)   PRAPARE - Hydrologist (Medical): No    Lack of Transportation (Non-Medical): No  Physical Activity: Sufficiently Active (09/08/2021)   Exercise Vital Sign    Days of Exercise per Week: 3 days    Minutes of Exercise per Session: 60 min  Stress: No Stress Concern Present (09/08/2021)   [place]    Feeling of Stress : Not at all  Social Connections: Moderately Integrated (09/08/2021)   Social Connection and Isolation Panel [NHANES]    Frequency of Communication with Friends and Family: More than three times a week    Frequency of Social Gatherings with Friends and Family: Three times a week    Attends Religious Services: 1 to 4 times per year    Active Member of Clubs or Organizations: Yes    Attends Music therapist: More than 4 times per year    Marital Status: Divorced  Human resources officer Violence: Not on file    Past Surgical History:  Procedure Laterality Date   cyst left wrist     dental implants     WISDOM TOOTH EXTRACTION        Current Outpatient Medications:    amLODipine (NORVASC) 5 MG tablet, TAKE 1 TABLET (5 MG TOTAL) BY MOUTH DAILY., Disp: 90 tablet, Rfl: 1   cetirizine (ZYRTEC) 10 MG tablet, Take 10 mg by  mouth daily., Disp: , Rfl:    Cholecalciferol (VITAMIN D3 PO), Take by mouth., Disp: , Rfl:    levalbuterol (XOPENEX HFA) 45 MCG/ACT inhaler, Inhale 2 puffs into the lungs every 6 (six) hours as needed for wheezing., Disp: 1 each, Rfl: 12   Multiple Vitamin (MULTIVITAMIN) tablet, Take 1 tablet by mouth daily. Centrum Mini's for Women 50 Plus, Disp: , Rfl:     Physical Exam: Blood pressure 122/62, pulse 70, height 5\' 6"  (1.676 m), weight 164 lb 12.8  oz (74.8 kg), SpO2 99 %.    Affect appropriate Healthy:  appears stated age 65: normal Neck supple with no adenopathy JVP normal no bruits no thyromegaly Lungs clear with no wheezing and good diaphragmatic motion Heart:  S1/S2 VSD murmur no RV lift/heave Abdomen: benighn, BS positve, no tenderness, no AAA no bruit.  No HSM or HJR Distal pulses intact with no bruits No edema Neuro non-focal Skin warm and dry No muscular weakness   Labs:   Lab Results  Component Value Date   WBC 5.3 07/22/2021   HGB 13.6 07/22/2021   HCT 41.1 07/22/2021   MCV 89.9 07/22/2021   PLT 229.0 07/22/2021   No results for input(s): "NA", "K", "CL", "CO2", "BUN", "CREATININE", "CALCIUM", "PROT", "BILITOT", "ALKPHOS", "ALT", "AST", "GLUCOSE" in the last 168 hours.  Invalid input(s): "LABALBU" No results found for: "CKTOTAL", "CKMB", "CKMBINDEX", "TROPONINI"  Lab Results  Component Value Date   CHOL 203 (H) 04/01/2022   CHOL 241 (H) 12/21/2021   CHOL 205 (H) 07/22/2021   Lab Results  Component Value Date   HDL 61.50 04/01/2022   HDL 64.60 12/21/2021   HDL 60.50 07/22/2021   Lab Results  Component Value Date   LDLCALC 128 (H) 04/01/2022   LDLCALC 163 (H) 12/21/2021   LDLCALC 132 (H) 07/22/2021   Lab Results  Component Value Date   TRIG 70.0 04/01/2022   TRIG 63.0 12/21/2021   TRIG 61.0 07/22/2021   Lab Results  Component Value Date   CHOLHDL 3 04/01/2022   CHOLHDL 4 12/21/2021   CHOLHDL 3 07/22/2021   No results found for:  "LDLDIRECT"    Radiology: ECHOCARDIOGRAM COMPLETE  Result Date: 05/03/2022    ECHOCARDIOGRAM REPORT   Patient Name:   Elizabeth Lozano     Date of Exam: 05/03/2022 Medical Rec #:  [id number]     Height:       66.0 in Accession #:    [id number]    Weight:       162.0 lb Date of Birth:  Jan 23, 1971      BSA:          1.828 m Patient Age:    42 years      BP:           134/80 mmHg Patient Gender: F             HR:           63 bpm. Exam Location:  [facility] Procedure: 2D Echo, Cardiac Doppler, Color Doppler and Strain Analysis Indications:    Q23.1 Bicuspid Aortic Valve  History:        Patient has prior history of Echocardiogram examinations, most                 recent 04/29/2016. Aortic Valve Disease, Signs/Symptoms:Murmur;                 Risk Factors:Family History of Coronary Artery Disease and                 Hypertension. Perimembranous VSD, Bicuspid Aortic Valve.  Sonographer:    Deliah Boston RDCS Referring Phys: Josue Hector IMPRESSIONS  1. Left ventricular ejection fraction, by estimation, is 60 to 65%. The left ventricle has normal function. The left ventricle has no regional wall motion abnormalities. Left ventricular diastolic parameters are consistent with Grade II diastolic dysfunction (pseudonormalization).  2. Right ventricular systolic function is normal. The right ventricular size is normal.  3. Left atrial size  was severely dilated.  4. Large perimembranous VSD. Significant peak gradient 202 mmHg. cannot exclude a small PFO.  5. Mild mitral valve regurgitation.  6. The aortic valve is bicuspid. Aortic valve regurgitation is not visualized.  7. The inferior vena cava is normal in size with greater than 50% respiratory variability, suggesting right atrial pressure of 3 mmHg. Conclusion(s)/Recommendation(s): Large perimembranous VSD, recommend a cardiac MRI. FINDINGS  Left Ventricle: Left ventricular ejection fraction, by estimation, is 60 to 65%. The left ventricle has normal function. The left  ventricle has no regional wall motion abnormalities. The left ventricular internal cavity size was normal in size. There is  no left ventricular hypertrophy. Left ventricular diastolic parameters are consistent with Grade II diastolic dysfunction (pseudonormalization). Right Ventricle: The right ventricular size is normal. Right ventricular systolic function is normal. Left Atrium: Left atrial size was severely dilated. Right Atrium: Right atrial size was normal in size. Pericardium: There is no evidence of pericardial effusion. Mitral Valve: Mild mitral valve regurgitation. Tricuspid Valve: Tricuspid valve regurgitation is trivial. Aortic Valve: The aortic valve is bicuspid. Aortic valve regurgitation is not visualized. Aortic valve mean gradient measures 6.5 mmHg. Aortic valve peak gradient measures 13.0 mmHg. Aortic valve area, by VTI measures 2.69 cm. Pulmonic Valve: Pulmonic valve regurgitation is mild. Aorta: The aortic root and ascending aorta are structurally normal, with no evidence of dilitation. Venous: The inferior vena cava is normal in size with greater than 50% respiratory variability, suggesting right atrial pressure of 3 mmHg. IAS/Shunts: Cannot exclude a small PFO.  LEFT VENTRICLE PLAX 2D LVIDd:         5.60 cm   Diastology LVIDs:         3.20 cm   LV e' medial:    5.87 cm/s LV PW:         0.80 cm   LV E/e' medial:  20.4 LV IVS:        1.00 cm   LV e' lateral:   7.84 cm/s LVOT diam:     2.40 cm   LV E/e' lateral: 15.3 LV SV:         105 LV SV Index:   57        2D Longitudinal Strain LVOT Area:     4.52 cm  2D Strain GLS (A2C):   -27.9 %                          2D Strain GLS (A3C):   -19.7 %                          2D Strain GLS (A4C):   -22.1 %                          2D Strain GLS Avg:     -23.2 %                           3D Volume EF:                          3D EF:        72 %                          LV EDV:  161 ml                          LV ESV:       45 ml                           LV SV:        117 ml RIGHT VENTRICLE RV Basal diam:  3.20 cm RV S prime:     10.80 cm/s TAPSE (M-mode): 1.6 cm RVSP:           20.3 mmHg LEFT ATRIUM             Index        RIGHT ATRIUM           Index LA diam:        4.40 cm 2.41 cm/m   RA Pressure: 3.00 mmHg LA Vol (A2C):   77.5 ml 42.38 ml/m  RA Area:     13.60 cm LA Vol (A4C):   96.4 ml 52.72 ml/m  RA Volume:   33.90 ml  18.54 ml/m LA Biplane Vol: 89.1 ml 48.73 ml/m  AORTIC VALVE AV Area (Vmax):    2.58 cm AV Area (Vmean):   2.61 cm AV Area (VTI):     2.69 cm AV Vmax:           180.50 cm/s AV Vmean:          115.500 cm/s AV VTI:            0.390 m AV Peak Grad:      13.0 mmHg AV Mean Grad:      6.5 mmHg LVOT Vmax:         103.00 cm/s LVOT Vmean:        66.750 cm/s LVOT VTI:          0.232 m LVOT/AV VTI ratio: 0.60  AORTA Ao Root diam: 2.90 cm Ao Asc diam:  2.80 cm MITRAL VALVE                TRICUSPID VALVE MV Area (PHT): cm          TR Peak grad:   17.3 mmHg MV Decel Time: 167 msec     TR Vmax:        208.00 cm/s MV E velocity: 120.00 cm/s  Estimated RAP:  3.00 mmHg MV A velocity: 76.40 cm/s   RVSP:           20.3 mmHg MV E/A ratio:  1.57                             SHUNTS                             Systemic VTI:  0.23 m                             Systemic Diam: 2.40 cm Phineas Inches Electronically signed by Phineas Inches Signature Date/Time: 05/03/2022/12:26:04 PM    Final     EKG: SR RAD, [id number] 05/12/2022    ASSESSMENT AND PLAN:   VSD: lost to f/u since 2018 when VSD seemed restrictive with normal RV TTE 05/03/22 concerning for further degeneration of membranous septum with large shunt She is allergic to contrast and  is concerned about iv angio contrast Discussed with Elizabeth Lozano and will arrange right and left cath with shunt run Then refer to CVTS  Bicuspid AV:  no AS/AR TTE 05/03/22  Suspect her valve will not need to be touched during any VSD patch repair closure  Palpitations :  benign related to stress  Asthma/Allergies : Zyrtec and  xopenex as needed  HTN:  improved on norvasc  HLD:  getting labs with primary next week Calcium score Low threshold to start statin   Right and left cath Labs today Refer to Elizabeth Lozano CVTS post cath Lab called orders written   F/U post cath  Signed: Jenkins Rouge 05/12/2022, 3:21 PM

## 2022-05-09 ENCOUNTER — Ambulatory Visit (HOSPITAL_BASED_OUTPATIENT_CLINIC_OR_DEPARTMENT_OTHER): Payer: BC Managed Care – PPO

## 2022-05-12 ENCOUNTER — Encounter: Payer: Self-pay | Admitting: Cardiovascular Disease

## 2022-05-12 ENCOUNTER — Ambulatory Visit: Payer: BC Managed Care – PPO | Attending: Cardiovascular Disease | Admitting: Cardiovascular Disease

## 2022-05-12 VITALS — BP 122/62 | HR 70 | Ht 66.0 in | Wt 164.8 lb

## 2022-05-12 DIAGNOSIS — E782 Mixed hyperlipidemia: Secondary | ICD-10-CM

## 2022-05-12 DIAGNOSIS — Q231 Congenital insufficiency of aortic valve: Secondary | ICD-10-CM | POA: Diagnosis not present

## 2022-05-12 DIAGNOSIS — Q210 Ventricular septal defect: Secondary | ICD-10-CM

## 2022-05-12 DIAGNOSIS — I10 Essential (primary) hypertension: Secondary | ICD-10-CM | POA: Diagnosis not present

## 2022-05-12 MED ORDER — DIPHENHYDRAMINE HCL 50 MG PO TABS: ORAL_TABLET | ORAL | 0 refills | Status: AC

## 2022-05-12 MED ORDER — PREDNISONE 50 MG PO TABS: ORAL_TABLET | ORAL | 1 refills | Status: AC

## 2022-05-12 NOTE — Patient Instructions (Addendum)
Medication Instructions:  Your physician recommends that you continue on your current medications as directed. Please refer to the Current Medication list given to you today.  *If you need a refill on your cardiac medications before your next appointment, please call your pharmacy*  Lab Work: If you have labs (blood work) drawn today and your tests are completely normal, you will receive your results only by: [place] (if you have MyChart) OR A paper copy in the mail If you have any lab test that is abnormal or we need to change your treatment, we will call you to review the results.  Testing/Procedures: Your physician has requested that you have a cardiac catheterization. Cardiac catheterization is used to diagnose and/or treat various heart conditions. Doctors may recommend this procedure for a number of different reasons. The most common reason is to evaluate chest pain. Chest pain can be a symptom of coronary artery disease (CAD), and cardiac catheterization can show whether plaque is narrowing or blocking your heart's arteries. This procedure is also used to evaluate the valves, as well as measure the blood flow and oxygen levels in different parts of your heart. For further information please visit [email or web address]. Please follow instruction sheet, as given.  Follow-Up: At [facility], you and your health needs are our priority.  As part of our continuing mission to provide you with exceptional heart care, we have created designated Provider Care Teams.  These Care Teams include your primary Cardiologist (physician) and Advanced Practice Providers (APPs -  Physician Assistants and Nurse Practitioners) who all work together to provide you with the care you need, when you need it.  We recommend signing up for the patient portal called "MyChart".  Sign up information is provided on this After Visit Summary.  MyChart is used to connect with patients for Virtual Visits  (Telemedicine).  Patients are able to view lab/test results, encounter notes, upcoming appointments, etc.  Non-urgent messages can be sent to your provider as well.   To learn more about what you can do with MyChart, go to [email or web address].    Your next appointment:   3 week(s)  Provider:   Jenkins Rouge, MD     You have been referred to Dr. Cyndia Bent for VSD  Other Instructions  [facility] DEPT [place], [place] [id number] [place] [address] Dept: [phone] Loc: [phone]  Chikamso Almasy  05/12/2022  You are scheduled for a Cardiac Catheterization on Thursday, April 4 with Dr. Sherren Mocha.  1. Please arrive at the [facility] (Main Entrance A) at [facility]: [address] [place], [place] [address] at 5:30 AM (This time is two hours before your procedure to ensure your preparation). Free valet parking service is available.   Special note: Every effort is made to have your procedure done on time. Please understand that emergencies sometimes delay scheduled procedures.  2. Diet: Do not eat solid foods after midnight.  The patient may have clear liquids until 5am upon the day of the procedure.  3. Labs: You will need to have blood drawn on Thursday, March 28 at [facility] at [facility]. [address]  Open: 7:30am - 5pm    Phone: [phone]. You do not need to be fasting.  4. Medication instructions in preparation for your procedure:   Contrast Allergy: Yes, Please take Prednisone 50mg  by mouth at: [place]  hours prior to cath 6:30 pm on Wednesday Seven hours prior to cath 12:30 am on Thursday And prior to leaving home please take last dose of Prednisone 50mg  and Benadryl 50mg  by mouth.  On the morning of your procedure, take your Aspirin 81 mg and any morning medicines NOT listed above.  You may use sips of  water.  5. Plan for one night stay--bring personal belongings. 6. Bring a current list of your medications and current insurance cards. 7. You MUST have a responsible person to drive you home. 8. Someone MUST be with you the first 24 hours after you arrive home or your discharge will be delayed. 9. Please wear clothes that are easy to get on and off and wear slip-on shoes.  Thank you for allowing [place] to care for you!   -- [facility]

## 2022-05-13 LAB — CBC WITH DIFFERENTIAL/PLATELET
Basophils Absolute: 0.1 10*3/uL (ref 0.0–0.2)
Basos: 1 %
EOS (ABSOLUTE): 0.1 10*3/uL (ref 0.0–0.4)
Eos: 1 %
Hematocrit: 40.7 % (ref 34.0–46.6)
Hemoglobin: 13.4 g/dL (ref 11.1–15.9)
Immature Grans (Abs): 0 10*3/uL (ref 0.0–0.1)
Immature Granulocytes: 0 %
Lymphocytes Absolute: 2.9 10*3/uL (ref 0.7–3.1)
Lymphs: 38 %
MCH: 29.7 pg (ref 26.6–33.0)
MCHC: 32.9 g/dL (ref 31.5–35.7)
MCV: 90 fL (ref 79–97)
Monocytes Absolute: 0.5 10*3/uL (ref 0.1–0.9)
Monocytes: 6 %
Neutrophils Absolute: 4.2 10*3/uL (ref 1.4–7.0)
Neutrophils: 54 %
Platelets: 240 10*3/uL (ref 150–450)
RBC: 4.51 x10E6/uL (ref 3.77–5.28)
RDW: 13.4 % (ref 11.7–15.4)
WBC: 7.7 10*3/uL (ref 3.4–10.8)

## 2022-05-13 LAB — BASIC METABOLIC PANEL
BUN/Creatinine Ratio: 32 — ABNORMAL HIGH (ref 9–23)
BUN: 23 mg/dL (ref 6–24)
CO2: 21 mmol/L (ref 20–29)
Calcium: 9.6 mg/dL (ref 8.7–10.2)
Chloride: 102 mmol/L (ref 96–106)
Creatinine, Ser: 0.73 mg/dL (ref 0.57–1.00)
Glucose: 82 mg/dL (ref 70–99)
Potassium: 4.1 mmol/L (ref 3.5–5.2)
Sodium: 141 mmol/L (ref 134–144)
eGFR: 98 mL/min/{1.73_m2} (ref >59)

## 2022-05-18 NOTE — Progress Notes (Signed)
Cardiology Office Note:    Date:  06/01/2022   ID:  Elizabeth Lozano, DOB 02-Aug-1970, MRN [id number]  PCP:  Philip Aspen, Limmie Patricia, MD  [facility] Providers Cardiologist:  Charlton Haws, MD     Referring MD: Philip Aspen, Estel*   Chief Complaint:  Follow-up     History of Present Illness:   Elizabeth Lozano is a 52 y.o. female with  History of HTN,  small restrictive peri membranous VSD and palpitations  Monitor 2018with only atrial ectopy /PVCls nothing sustained or long No beta blocker due to asthma Allergies to cats/mold    TTE 2018 with mild LVH EF 65-70% restrictive VSD Bicuspid AV with no AS/AR Moderate LAE Normal RV size and function     Patient saw Dr. Eden Emms 3/204 TTE done 05/03/22 reviewed and compared to one done 04/29/16 She appears to have further enlargement of her membranous septum with bigger left to right shunting Bicuspid AV with no significant AR/AS mild MR and normal RV size and function    He spoke  with Dr Laneta Simmers and need to make sure cors ok rather than do cardiac MRI/cardiac CT feel right and left cath with shunt run best.  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 Dr. Eden Emms said she doesn't need referral to Dr. Laneta Simmers and can f/u cardiac MRI in 1 yr.  Patient comes in for f/u. Still has some swelling at cath site. LDL 128, Father MI 43. Overall she feels good, works out at gym regularly.     Past Medical History:  Diagnosis Date   Allergy    she has seen Dr. Eileen Stanford    Anemia    long term issues per pt   Anxiety    due to back pain    Asthma    Depression    Heart murmur    History of stress test    ETT-echo (9/14):  ST depression noted on ECGs; no wall motion abnormalities at peak exercise on  echocardiogram   Hypertension    VSD (ventricular septal defect)    Cardiac CTA 05/2008: Membranous VSD, 8-9 mm in diameter, mild RVE, EF 64%, calcium score 0, no CAD. Last echo 02/2011: EF 55-60%, mild CAD, perimembranous VSD slightly more prominent but no RVE or pulmonary hypertension;  Echo (9/14): Small perimembranous VSD-no significant change since 02/2011; EF 55-60%, moderate LAE   Current Medications: Current Meds  Medication Sig   amLODipine (NORVASC) 5 MG tablet TAKE 1 TABLET (5 MG TOTAL) BY MOUTH DAILY.   cetirizine (ZYRTEC) 10 MG tablet Take 10 mg by mouth daily.   Cholecalciferol (VITAMIN D) 50 MCG (2000 UT) tablet Take 2,000 Units by mouth daily.   diphenhydrAMINE (BENADRYL) 50 MG tablet Take one tablet 2 hours (when you are on your way to procedure) prior to heart cath schedule.   levalbuterol (XOPENEX HFA) 45 MCG/ACT inhaler Inhale 2 puffs into the lungs every 6 (six) hours as needed for wheezing.   Pediatric Multivitamins-Iron (FLINTSTONES PLUS IRON PO) Take 1 tablet by mouth 3 (three) times a week.   predniSONE (DELTASONE) 50 MG tablet Take one tablet by mouth 13 hours(6:30 pm Wednesday 05/18/22), 7 hours (12:30 am Wednesday 05/19/22) and 2 hours (when you are on your way to  procedure) prior to heart cath schedule.   Current Facility-Administered Medications for the 06/01/22 encounter (Office Visit) with Elizabeth Kief, PA-C  Medication   sodium chloride flush (NS) 0.9 % injection 3 mL    Allergies:   Codeine, Erythromycin, Iohexol, and Tetracycline   Social History   Tobacco Use   Smoking status: Never   Smokeless tobacco: Never  Vaping Use   Vaping Use: Never used  Substance Use Topics   Alcohol use: Yes    Alcohol/week: 4.0 standard drinks of alcohol    Types: 4 Standard drinks or equivalent per week    Comment: one beer per day per pt   Drug use: No    Family Hx: The patient's family history includes Cancer in her father; Colon cancer in her paternal grandmother;  Colon polyps in her mother; Healthy in her brother; Heart attack in her father; Hyperlipidemia in her father; Hypertension in her father; Melanoma in her father; Sarcoidosis in her mother. There is no history of Crohn's disease, Esophageal cancer, Rectal cancer, or Stomach cancer.  ROS     Physical Exam:    VS:  BP 126/87 (BP Location: Left Arm, Patient Position: Sitting, Cuff Size: Normal)   Pulse 84   Ht 5\' 6"  (1.676 m)   Wt 169 lb (76.7 kg)   LMP 11/28/2017 (LMP Unknown)   BMI 27.28 kg/m     Wt Readings from Last 3 Encounters:  06/01/22 169 lb (76.7 kg)  05/19/22 160 lb (72.6 kg)  05/12/22 164 lb 12.8 oz (74.8 kg)    Physical Exam  GEN: Well nourished, well developed, in no acute distress  Neck: no JVD, carotid bruits, or masses Cardiac:RRR; no murmurs, rubs, or gallops  Respiratory:  clear to auscultation bilaterally, normal work of breathing GI: soft, nontender, nondistended, + BS Ext: right wrist at cath site stable, right groin small knot at cath site, some bruising otherwise LE without cyanosis, clubbing, or edema, Good distal pulses bilaterally Neuro:  Alert and Oriented x 3,  Psych: euthymic mood, full affect        EKGs/Labs/Other Test Reviewed:    EKG:  EKG is not ordered today.    Recent Labs: 07/22/2021: ALT 11; TSH 1.97 05/12/2022: BUN 23; Creatinine, Ser 0.73; Platelets 240 05/19/2022: Hemoglobin 12.9; Hemoglobin 12.9; Potassium 3.7; Potassium 3.7; Sodium 140; Sodium 140   Recent Lipid Panel Recent Labs    04/01/22 0817  CHOL 203*  TRIG 70.0  HDL 61.50  VLDL 14.0  LDLCALC 128*     Prior CV Studies:    [facility] 05/19/22 Angiographically normal coronary arteries Normal right heart pressures with: RA mean 6 RV 37/15 PA 33/12 mean 23  PCWP mean 14 LVEDP 13 Normal oxygen saturation run with QP:QS 1.1:1 Ao sat 96 PA sat 81 SVC sat 81 IVC sat 83 Known restrictive VSD by noninvasive imaging 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   VSD: lost to f/u since 2018 when VSD seemed restrictive with normal RV TTE 05/03/22 concerning for further degeneration of membranous septum with large shunt S Discussed with Dr Laneta Simmers and will arrange right and left cath with shunt run. Cath normal cors, normal RH pressures, normal Oxygen sat run, plan to do cardiac MRI in 1 yr. She's worried about the cost of this so will schedule  f/u with Dr. Eden Emms to discuss in 1 yr.  Palpitations :  benign related to stress -no complaints today  HTN:  controlled on norvasc   HLD:  LDL 128, she does have family history of CAD but would like to try to get it down on her own rather than start a statin. With normal coronary arteries on cath this is reasonable approach.              Dispo:  No follow-ups on file.   Medication Adjustments/Labs and Tests Ordered: Current medicines are reviewed at length with the patient today.  Concerns regarding medicines are outlined above.  Tests Ordered: No orders of the defined types were placed in this encounter.  Medication Changes: No orders of the defined types were placed in this encounter.  Elson Clan, PA-C  06/01/2022 11:16 AM    [facility] [address] [place], [place], [place]  [address] Phone: [phone]; Fax: [phone]

## 2022-05-19 ENCOUNTER — Encounter (HOSPITAL_COMMUNITY)
Admission: RE | Disposition: A | Discharge status: Home / Self Care | Payer: Self-pay | Source: Home / Self Care | Attending: Cardiovascular Disease

## 2022-05-19 ENCOUNTER — Encounter (HOSPITAL_COMMUNITY): Payer: Self-pay | Admitting: Cardiovascular Disease

## 2022-05-19 ENCOUNTER — Other Ambulatory Visit: Payer: Self-pay | Admitting: Cardiovascular Disease

## 2022-05-19 ENCOUNTER — Ambulatory Visit (HOSPITAL_COMMUNITY)
Admission: RE | Admit: 2022-05-19 | Discharge: 2022-05-19 | Disposition: A | Discharge status: Home / Self Care | Payer: BC Managed Care – PPO | Attending: Cardiovascular Disease | Admitting: Cardiovascular Disease

## 2022-05-19 ENCOUNTER — Telehealth: Payer: Self-pay

## 2022-05-19 DIAGNOSIS — E785 Hyperlipidemia, unspecified: Secondary | ICD-10-CM | POA: Diagnosis not present

## 2022-05-19 DIAGNOSIS — Z79899 Other long term (current) drug therapy: Secondary | ICD-10-CM | POA: Diagnosis not present

## 2022-05-19 DIAGNOSIS — Q231 Congenital insufficiency of aortic valve: Secondary | ICD-10-CM | POA: Insufficient documentation

## 2022-05-19 DIAGNOSIS — I10 Essential (primary) hypertension: Secondary | ICD-10-CM | POA: Insufficient documentation

## 2022-05-19 DIAGNOSIS — Q210 Ventricular septal defect: Secondary | ICD-10-CM

## 2022-05-19 DIAGNOSIS — J45909 Unspecified asthma, uncomplicated: Secondary | ICD-10-CM | POA: Insufficient documentation

## 2022-05-19 DIAGNOSIS — R002 Palpitations: Secondary | ICD-10-CM | POA: Diagnosis not present

## 2022-05-19 HISTORY — PX: RIGHT/LEFT HEART CATH AND CORONARY ANGIOGRAPHY: CATH118266

## 2022-05-19 LAB — POCT I-STAT EG7
Acid-Base Excess: 0 mmol/L (ref 0.0–2.0)
Acid-base deficit: 1 mmol/L (ref 0.0–2.0)
Acid-base deficit: 2 mmol/L (ref 0.0–2.0)
Acid-base deficit: 1 mmol/L (ref 0.0–2.0)
Acid-base deficit: 2 mmol/L (ref 0.0–2.0)
Bicarbonate: 23.6 mmol/L (ref 20.0–28.0)
Bicarbonate: 24.5 mmol/L (ref 20.0–28.0)
Bicarbonate: 25.8 mmol/L (ref 20.0–28.0)
Bicarbonate: 24 mmol/L (ref 20.0–28.0)
Bicarbonate: 24.9 mmol/L (ref 20.0–28.0)
Calcium, Ion: 1.21 mmol/L (ref 1.15–1.40)
Calcium, Ion: 1.25 mmol/L (ref 1.15–1.40)
Calcium, Ion: 1.31 mmol/L (ref 1.15–1.40)
Calcium, Ion: 1.29 mmol/L (ref 1.15–1.40)
Calcium, Ion: 1.3 mmol/L (ref 1.15–1.40)
HCT: 37 % (ref 36.0–46.0)
HCT: 37 % (ref 36.0–46.0)
HCT: 38 % (ref 36.0–46.0)
HCT: 38 % (ref 36.0–46.0)
HCT: 38 % (ref 36.0–46.0)
Hemoglobin: 12.6 g/dL (ref 12.0–15.0)
Hemoglobin: 12.6 g/dL (ref 12.0–15.0)
Hemoglobin: 12.9 g/dL (ref 12.0–15.0)
Hemoglobin: 12.9 g/dL (ref 12.0–15.0)
Hemoglobin: 12.9 g/dL (ref 12.0–15.0)
O2 Saturation: 71 %
O2 Saturation: 81 %
O2 Saturation: 83 %
O2 Saturation: 79 %
O2 Saturation: 82 %
Potassium: 3.5 mmol/L (ref 3.5–5.1)
Potassium: 3.6 mmol/L (ref 3.5–5.1)
Potassium: 3.6 mmol/L (ref 3.5–5.1)
Potassium: 3.7 mmol/L (ref 3.5–5.1)
Potassium: 3.7 mmol/L (ref 3.5–5.1)
Sodium: 140 mmol/L (ref 135–145)
Sodium: 141 mmol/L (ref 135–145)
Sodium: 143 mmol/L (ref 135–145)
Sodium: 140 mmol/L (ref 135–145)
Sodium: 140 mmol/L (ref 135–145)
TCO2: 25 mmol/L (ref 22–32)
TCO2: 26 mmol/L (ref 22–32)
TCO2: 27 mmol/L (ref 22–32)
TCO2: 25 mmol/L (ref 22–32)
TCO2: 26 mmol/L (ref 22–32)
pCO2, Ven: 42 mmHg — ABNORMAL LOW (ref 44–60)
pCO2, Ven: 43.4 mmHg — ABNORMAL LOW (ref 44–60)
pCO2, Ven: 44.7 mmHg (ref 44–60)
pCO2, Ven: 42.5 mmHg — ABNORMAL LOW (ref 44–60)
pCO2, Ven: 44.1 mmHg (ref 44–60)
pH, Ven: 7.346 (ref 7.25–7.43)
pH, Ven: 7.357 (ref 7.25–7.43)
pH, Ven: 7.381 (ref 7.25–7.43)
pH, Ven: 7.359 (ref 7.25–7.43)
pH, Ven: 7.359 (ref 7.25–7.43)
pO2, Ven: 39 mmHg (ref 32–45)
pO2, Ven: 47 mmHg — ABNORMAL HIGH (ref 32–45)
pO2, Ven: 49 mmHg — ABNORMAL HIGH (ref 32–45)
pO2, Ven: 46 mmHg — ABNORMAL HIGH (ref 32–45)
pO2, Ven: 49 mmHg — ABNORMAL HIGH (ref 32–45)

## 2022-05-19 LAB — POCT I-STAT 7, (LYTES, BLD GAS, ICA,H+H)
Acid-base deficit: 2 mmol/L (ref 0.0–2.0)
Bicarbonate: 22.3 mmol/L (ref 20.0–28.0)
Calcium, Ion: 1.25 mmol/L (ref 1.15–1.40)
HCT: 37 % (ref 36.0–46.0)
Hemoglobin: 12.6 g/dL (ref 12.0–15.0)
O2 Saturation: 96 %
Potassium: 3.7 mmol/L (ref 3.5–5.1)
Sodium: 141 mmol/L (ref 135–145)
TCO2: 23 mmol/L (ref 22–32)
pCO2 arterial: 36.3 mmHg (ref 32–48)
pH, Arterial: 7.396 (ref 7.35–7.45)
pO2, Arterial: 83 mmHg (ref 83–108)

## 2022-05-19 LAB — POCT ACTIVATED CLOTTING TIME: Activated Clotting Time: 212 seconds

## 2022-05-19 SURGERY — RIGHT/LEFT HEART CATH AND CORONARY ANGIOGRAPHY
Anesthesia: LOCAL

## 2022-05-19 MED ORDER — SODIUM CHLORIDE 0.9% FLUSH: 3.0000 mL | INTRAVENOUS | Status: DC | PRN

## 2022-05-19 MED ORDER — SODIUM CHLORIDE 0.9 % WEIGHT BASED INFUSION
3.0000 mL/kg/h | INTRAVENOUS | Status: AC
  Administered 2022-05-19: 3 mL/kg/h via INTRAVENOUS

## 2022-05-19 MED ORDER — SODIUM CHLORIDE 0.9% FLUSH: 3.0000 mL | Freq: Two times a day (BID) | INTRAVENOUS | Status: AC

## 2022-05-19 MED ORDER — IOHEXOL 350 MG/ML SOLN
INTRAVENOUS | Status: DC | PRN
  Administered 2022-05-19: 65 mL

## 2022-05-19 MED ORDER — HEPARIN SODIUM (PORCINE) 1000 UNIT/ML IJ SOLN
INTRAMUSCULAR | Status: AC
  Filled 2022-05-19: qty 10

## 2022-05-19 MED ORDER — VERAPAMIL HCL 2.5 MG/ML IV SOLN
INTRAVENOUS | Status: AC
  Filled 2022-05-19: qty 2

## 2022-05-19 MED ORDER — SODIUM CHLORIDE 0.9 % IV SOLN: 250.0000 mL | INTRAVENOUS | Status: DC | PRN

## 2022-05-19 MED ORDER — HYDRALAZINE HCL 20 MG/ML IJ SOLN: 10.0000 mg | INTRAMUSCULAR | Status: DC | PRN

## 2022-05-19 MED ORDER — ASPIRIN 81 MG PO CHEW: 81.0000 mg | CHEWABLE_TABLET | ORAL | Status: AC

## 2022-05-19 MED ORDER — MIDAZOLAM HCL 2 MG/2ML IJ SOLN
INTRAMUSCULAR | Status: AC
  Filled 2022-05-19: qty 2

## 2022-05-19 MED ORDER — SODIUM CHLORIDE 0.9% FLUSH: 3.0000 mL | Freq: Two times a day (BID) | INTRAVENOUS | Status: DC

## 2022-05-19 MED ORDER — SODIUM CHLORIDE 0.9 % WEIGHT BASED INFUSION: 1.0000 mL/kg/h | INTRAVENOUS | Status: DC

## 2022-05-19 MED ORDER — FENTANYL CITRATE (PF) 100 MCG/2ML IJ SOLN
INTRAMUSCULAR | Status: AC
  Filled 2022-05-19: qty 2

## 2022-05-19 MED ORDER — MIDAZOLAM HCL 2 MG/2ML IJ SOLN
INTRAMUSCULAR | Status: DC | PRN
  Administered 2022-05-19 (×2): 2 mg via INTRAVENOUS

## 2022-05-19 MED ORDER — LIDOCAINE HCL (PF) 1 % IJ SOLN
INTRAMUSCULAR | Status: DC | PRN
  Administered 2022-05-19: 5 mL via INTRADERMAL
  Administered 2022-05-19 (×3): 2 mL via INTRADERMAL

## 2022-05-19 MED ORDER — HEPARIN SODIUM (PORCINE) 1000 UNIT/ML IJ SOLN
INTRAMUSCULAR | Status: DC | PRN
  Administered 2022-05-19: 4000 [IU] via INTRAVENOUS

## 2022-05-19 MED ORDER — ONDANSETRON HCL 4 MG/2ML IJ SOLN: 4.0000 mg | Freq: Four times a day (QID) | INTRAMUSCULAR | Status: DC | PRN

## 2022-05-19 MED ORDER — LIDOCAINE HCL (PF) 1 % IJ SOLN
INTRAMUSCULAR | Status: AC
  Filled 2022-05-19: qty 30

## 2022-05-19 MED ORDER — LABETALOL HCL 5 MG/ML IV SOLN: 10.0000 mg | INTRAVENOUS | Status: DC | PRN

## 2022-05-19 MED ORDER — HEPARIN (PORCINE) IN NACL 1000-0.9 UT/500ML-% IV SOLN
INTRAVENOUS | Status: DC | PRN
  Administered 2022-05-19 (×2): 500 mL

## 2022-05-19 MED ORDER — ACETAMINOPHEN 325 MG PO TABS: 650.0000 mg | ORAL_TABLET | ORAL | Status: DC | PRN

## 2022-05-19 MED ORDER — VERAPAMIL HCL 2.5 MG/ML IV SOLN
INTRAVENOUS | Status: DC | PRN
  Administered 2022-05-19: 10 mL via INTRA_ARTERIAL

## 2022-05-19 MED ORDER — FENTANYL CITRATE (PF) 100 MCG/2ML IJ SOLN
INTRAMUSCULAR | Status: DC | PRN
  Administered 2022-05-19 (×2): 25 ug via INTRAVENOUS

## 2022-05-19 SURGICAL SUPPLY — 20 items
CATH 5FR JL3.5 JR4 ANG PIG MP (CATHETERS) IMPLANT
CATH BALLN WEDGE 5F 110CM (CATHETERS) IMPLANT
CATH INFINITI 5FR JL4 (CATHETERS) IMPLANT
CLOSURE MYNX CONTROL 5F (Vascular Products) IMPLANT
DEVICE RAD COMP TR BAND LRG (VASCULAR PRODUCTS) IMPLANT
GLIDESHEATH SLEND SS 6F .021 (SHEATH) IMPLANT
GUIDEWIRE .025 260CM (WIRE) IMPLANT
GUIDEWIRE INQWIRE 1.5J.035X260 (WIRE) IMPLANT
INQWIRE 1.5J .035X260CM (WIRE) ×1
KIT HEART LEFT (KITS) ×1 IMPLANT
KIT MICROPUNCTURE NIT STIFF (SHEATH) IMPLANT
PACK CARDIAC CATHETERIZATION (CUSTOM PROCEDURE TRAY) ×1 IMPLANT
SHEATH GLIDE SLENDER 4/5FR (SHEATH) IMPLANT
SHEATH PINNACLE 5F 10CM (SHEATH) IMPLANT
SHEATH PROBE COVER 6X72 (BAG) IMPLANT
SYR MEDRAD MARK 7 150ML (SYRINGE) ×1 IMPLANT
TRANSDUCER W/STOPCOCK (MISCELLANEOUS) ×1 IMPLANT
TUBING CIL FLEX 10 FLL-RA (TUBING) ×1 IMPLANT
WIRE EMERALD 3MM-J .025X260CM (WIRE) IMPLANT
WIRE EMERALD 3MM-J .035X150CM (WIRE) IMPLANT

## 2022-05-19 NOTE — Telephone Encounter (Signed)
-----   Message from Josue Hector, MD sent at 05/19/2022  2:39 PM EDT ----- Given heart cath results does not need referral to Dr Cyndia Bent will do f/u cardiac MRI in a year

## 2022-05-19 NOTE — Telephone Encounter (Unsigned)
Tried to call patient. Will try again later.

## 2022-05-19 NOTE — Interval H&P Note (Signed)
History and Physical Interval Note:  05/19/2022 7:46 AM  Elizabeth Lozano  has presented today for surgery, with the diagnosis of vsd.  The various methods of treatment have been discussed with the patient and family. After consideration of risks, benefits and other options for treatment, the patient has consented to  Procedure(s): RIGHT/LEFT HEART CATH AND CORONARY ANGIOGRAPHY (N/A) as a surgical intervention.  The patient's history has been reviewed, patient examined, no change in status, stable for surgery.  I have reviewed the patient's chart and labs.  Questions were answered to the patient's satisfaction.     Sherren Mocha

## 2022-05-19 NOTE — Discharge Instructions (Signed)
Femoral Site Care This sheet gives you information about how to care for yourself after your procedure. Your health care provider may also give you more specific instructions. If you have problems or questions, contact your health care provider. What can I expect after the procedure?  After the procedure, it is common to have: Bruising that usually fades within 1-2 weeks. Tenderness at the site. Follow these instructions at home: Wound care Follow instructions from your health care provider about how to take care of your insertion site. Make sure you: Wash your hands with soap and water before you change your bandage (dressing). If soap and water are not available, use hand sanitizer. Remove your dressing as told by your health care provider. In 24 hours Do not take baths, swim, or use a hot tub until your health care provider approves. You may shower 24-48 hours after the procedure or as told by your health care provider. Gently wash the site with plain soap and water. Pat the area dry with a clean towel. Do not rub the site. This may cause bleeding. Do not apply powder or lotion to the site. Keep the site clean and dry. Check your femoral site every day for signs of infection. Check for: Redness, swelling, or pain. Fluid or blood. Warmth. Pus or a bad smell. Activity For the first 2-3 days after your procedure, or as long as directed: Avoid climbing stairs as much as possible. Do not squat. Do not lift anything that is heavier than 10 lb (4.5 kg), or the limit that you are told, until your health care provider says that it is safe. For 5 days Rest as directed. Avoid sitting for a long time without moving. Get up to take short walks every 1-2 hours. Do not drive for 24 hours if you were given a medicine to help you relax (sedative). General instructions Take over-the-counter and prescription medicines only as told by your health care provider. Keep all follow-up visits as told by  your health care provider. This is important. Contact a health care provider if you have: A fever or chills. You have redness, swelling, or pain around your insertion site. Get help right away if: The catheter insertion area swells very fast. You pass out. You suddenly start to sweat or your skin gets clammy. The catheter insertion area is bleeding, and the bleeding does not stop when you hold steady pressure on the area. The area near or just beyond the catheter insertion site becomes pale, cool, tingly, or numb. These symptoms may represent a serious problem that is an emergency. Do not wait to see if the symptoms will go away. Get medical help right away. Call your local emergency services (911 in the [place] Do not drive yourself to the hospital. Summary After the procedure, it is common to have bruising that usually fades within 1-2 weeks. Check your femoral site every day for signs of infection. Do not lift anything that is heavier than 10 lb (4.5 kg), or the limit that you are told, until your health care provider says that it is safe. This information is not intended to replace advice given to you by your health care provider. Make sure you discuss any questions you have with your health care provider. Document Revised: 02/13/2017 Document Reviewed: 02/13/2017 Elsevier Patient Education  2020 Elsevier Inc[employer] 

## 2022-05-24 ENCOUNTER — Encounter: Payer: Self-pay | Admitting: Cardiovascular Disease

## 2022-06-01 ENCOUNTER — Ambulatory Visit: Payer: BC Managed Care – PPO | Attending: Physician Assistant | Admitting: Physician Assistant

## 2022-06-01 ENCOUNTER — Encounter: Payer: Self-pay | Admitting: Physician Assistant

## 2022-06-01 VITALS — BP 126/87 | HR 84 | Ht 66.0 in | Wt 169.0 lb

## 2022-06-01 DIAGNOSIS — Q2542 Hypoplasia of aorta: Secondary | ICD-10-CM

## 2022-06-01 DIAGNOSIS — R002 Palpitations: Secondary | ICD-10-CM

## 2022-06-01 DIAGNOSIS — I10 Essential (primary) hypertension: Secondary | ICD-10-CM

## 2022-06-01 DIAGNOSIS — Q210 Ventricular septal defect: Secondary | ICD-10-CM | POA: Diagnosis not present

## 2022-06-01 DIAGNOSIS — E782 Mixed hyperlipidemia: Secondary | ICD-10-CM

## 2022-06-01 NOTE — Patient Instructions (Addendum)
Medication Instructions:  Your physician recommends that you continue on your current medications as directed. Please refer to the Current Medication list given to you today.  *If you need a refill on your cardiac medications before your next appointment, please call your pharmacy*   Lab Work: None ordered   If you have labs (blood work) drawn today and your tests are completely normal, you will receive your results only by: MyChart Message (if you have MyChart) OR A paper copy in the mail If you have any lab test that is abnormal or we need to change your treatment, we will call you to review the results.   Testing/Procedures: None ordered   Follow-Up: At [facility], you and your health needs are our priority.  As part of our continuing mission to provide you with exceptional heart care, we have created designated Provider Care Teams.  These Care Teams include your primary Cardiologist (physician) and Advanced Practice Providers (APPs -  Physician Assistants and Nurse Practitioners) who all work together to provide you with the care you need, when you need it.  We recommend signing up for the patient portal called "MyChart".  Sign up information is provided on this After Visit Summary.  MyChart is used to connect with patients for Virtual Visits (Telemedicine).  Patients are able to view lab/test results, encounter notes, upcoming appointments, etc.  Non-urgent messages can be sent to your provider as well.   To learn more about what you can do with MyChart, go to [email or web address].    Your next appointment:   12 month(s)  Provider:   Charlton Haws, MD     Other Instructions Call [phone], option 2 for a estimation on the cardiac MRI

## 2022-06-08 ENCOUNTER — Encounter: Payer: BC Managed Care – PPO | Admitting: Surgery

## 2022-07-10 ENCOUNTER — Other Ambulatory Visit: Payer: Self-pay | Admitting: Internal Medicine

## 2022-07-10 DIAGNOSIS — I10 Essential (primary) hypertension: Secondary | ICD-10-CM

## 2022-10-09 ENCOUNTER — Other Ambulatory Visit: Payer: Self-pay | Admitting: Internal Medicine

## 2022-10-09 DIAGNOSIS — I10 Essential (primary) hypertension: Secondary | ICD-10-CM

## 2023-01-07 ENCOUNTER — Other Ambulatory Visit: Payer: Self-pay | Admitting: Internal Medicine

## 2023-01-07 DIAGNOSIS — I10 Essential (primary) hypertension: Secondary | ICD-10-CM

## 2023-02-01 ENCOUNTER — Other Ambulatory Visit: Payer: Self-pay | Admitting: Internal Medicine

## 2023-02-01 DIAGNOSIS — I10 Essential (primary) hypertension: Secondary | ICD-10-CM

## 2023-03-27 ENCOUNTER — Ambulatory Visit (INDEPENDENT_AMBULATORY_CARE_PROVIDER_SITE_OTHER): Payer: Self-pay | Admitting: Internal Medicine

## 2023-03-27 ENCOUNTER — Encounter: Payer: Self-pay | Admitting: Internal Medicine

## 2023-03-27 VITALS — BP 130/84 | HR 76 | Temp 98.3°F | Ht 67.0 in | Wt 171.5 lb

## 2023-03-27 DIAGNOSIS — Z1159 Encounter for screening for other viral diseases: Secondary | ICD-10-CM | POA: Diagnosis not present

## 2023-03-27 DIAGNOSIS — E782 Mixed hyperlipidemia: Secondary | ICD-10-CM | POA: Diagnosis not present

## 2023-03-27 DIAGNOSIS — E559 Vitamin D deficiency, unspecified: Secondary | ICD-10-CM | POA: Diagnosis not present

## 2023-03-27 DIAGNOSIS — Z114 Encounter for screening for human immunodeficiency virus [HIV]: Secondary | ICD-10-CM

## 2023-03-27 DIAGNOSIS — I10 Essential (primary) hypertension: Secondary | ICD-10-CM

## 2023-03-27 DIAGNOSIS — Z23 Encounter for immunization: Secondary | ICD-10-CM | POA: Diagnosis not present

## 2023-03-27 DIAGNOSIS — Z0000 Encounter for general adult medical examination without abnormal findings: Secondary | ICD-10-CM | POA: Diagnosis not present

## 2023-03-27 LAB — LIPID PANEL
Cholesterol: 226 mg/dL — ABNORMAL HIGH (ref 0–200)
HDL: 67.2 mg/dL (ref >39.00)
LDL Cholesterol: 143 mg/dL — ABNORMAL HIGH (ref 0–99)
NonHDL: 159.07
Total CHOL/HDL Ratio: 3
Triglycerides: 79 mg/dL (ref 0.0–149.0)
VLDL: 15.8 mg/dL (ref 0.0–40.0)

## 2023-03-27 LAB — CBC WITH DIFFERENTIAL/PLATELET
Basophils Absolute: 0.1 10*3/uL (ref 0.0–0.1)
Basophils Relative: 1 % (ref 0.0–3.0)
Eosinophils Absolute: 0.1 10*3/uL (ref 0.0–0.7)
Eosinophils Relative: 2.1 % (ref 0.0–5.0)
HCT: 38.2 % (ref 36.0–46.0)
Hemoglobin: 12.7 g/dL (ref 12.0–15.0)
Lymphocytes Relative: 47.3 % — ABNORMAL HIGH (ref 12.0–46.0)
Lymphs Abs: 2.6 10*3/uL (ref 0.7–4.0)
MCHC: 33.4 g/dL (ref 30.0–36.0)
MCV: 89.9 fL (ref 78.0–100.0)
Monocytes Absolute: 0.4 10*3/uL (ref 0.1–1.0)
Monocytes Relative: 7.7 % (ref 3.0–12.0)
Neutro Abs: 2.3 10*3/uL (ref 1.4–7.7)
Neutrophils Relative %: 41.9 % — ABNORMAL LOW (ref 43.0–77.0)
Platelets: 230 10*3/uL (ref 150.0–400.0)
RBC: 4.25 Mil/uL (ref 3.87–5.11)
RDW: 14.4 % (ref 11.5–15.5)
WBC: 5.5 10*3/uL (ref 4.0–10.5)

## 2023-03-27 LAB — COMPREHENSIVE METABOLIC PANEL
ALT: 16 U/L (ref 0–35)
AST: 20 U/L (ref 0–37)
Albumin: 4.3 g/dL (ref 3.5–5.2)
Alkaline Phosphatase: 74 U/L (ref 39–117)
BUN: 15 mg/dL (ref 6–23)
CO2: 30 meq/L (ref 19–32)
Calcium: 9.2 mg/dL (ref 8.4–10.5)
Chloride: 102 meq/L (ref 96–112)
Creatinine, Ser: 0.76 mg/dL (ref 0.40–1.20)
GFR: 89.09 mL/min (ref >60.00)
Glucose, Bld: 86 mg/dL (ref 70–99)
Potassium: 3.8 meq/L (ref 3.5–5.1)
Sodium: 139 meq/L (ref 135–145)
Total Bilirubin: 0.6 mg/dL (ref 0.2–1.2)
Total Protein: 7.4 g/dL (ref 6.0–8.3)

## 2023-03-27 LAB — TSH: TSH: 2.55 u[IU]/mL (ref 0.35–5.50)

## 2023-03-27 LAB — VITAMIN D 25 HYDROXY (VIT D DEFICIENCY, FRACTURES): VITD: 30.94 ng/mL (ref 30.00–100.00)

## 2023-03-27 LAB — VITAMIN B12: Vitamin B-12: 609 pg/mL (ref 211–911)

## 2023-03-27 NOTE — Addendum Note (Signed)
 Addended by: Nicolina Barrios B on: 03/27/2023 08:34 AM   Modules accepted: Orders

## 2023-03-27 NOTE — Progress Notes (Signed)
 Established Patient Office Visit     CC/Reason for Visit: Annual preventive exam  HPI: Elizabeth Lozano is a 53 y.o. female who is coming in today for the above mentioned reasons. Past Medical History is significant for: Hypertension, well-controlled depression, a known restrictive ventral septal defect.  She follows with cardiology annually with cardiac MRIs.  She has been doing well and has no acute concerns or complaints.  She has routine dental care but is overdue for an eye exam.  She is due for Tdap.  She has routine GYN care, will obtain records.  Had a colonoscopy in 2021 and is a 10-year callback.   Past Medical/Surgical History: Past Medical History:  Diagnosis Date   Allergy    she has seen Dr. Anselmo Kings    Anemia    long term issues per pt   Anxiety    due to back pain    Asthma    Depression    Heart murmur    History of stress test    ETT-echo (9/14):  ST depression noted on ECGs; no wall motion abnormalities at peak exercise on echocardiogram   Hypertension    VSD (ventricular septal defect)    Cardiac CTA 05/2008: Membranous VSD, 8-9 mm in diameter, mild RVE, EF 64%, calcium score 0, no CAD. Last echo 02/2011: EF 55-60%, mild CAD, perimembranous VSD slightly more prominent but no RVE or pulmonary hypertension;  Echo (9/14): Small perimembranous VSD-no significant change since 02/2011; EF 55-60%, moderate LAE    Past Surgical History:  Procedure Laterality Date   cyst left wrist     dental implants     RIGHT/LEFT HEART CATH AND CORONARY ANGIOGRAPHY N/A 05/19/2022   Procedure: RIGHT/LEFT HEART CATH AND CORONARY ANGIOGRAPHY;  Surgeon: Arnoldo Lapping, MD;  Location: [facility] INVASIVE CV LAB;  Service: Cardiovascular;  Laterality: N/A;   WISDOM TOOTH EXTRACTION      Social History:  reports that she has never smoked. She has never used smokeless tobacco. She reports current alcohol use of about 4.0 standard drinks of alcohol per week. She reports that she does not use  drugs.  Allergies: Allergies  Allergen Reactions   Codeine Nausea And Vomiting    Tolerates in small amounts    Erythromycin Nausea And Vomiting   Iohexol  Hives     Code: HIVES, Desc: RN AMY NOTICED HIVE ON STOMACH AFTER HEART SCAN. ALSO ONE ON BACK. NOT ITCHING UNTIL AFTER SHE KNEW OF HIVES. DR.NISHAN NOTIFIED. GIVEN BENADRYL ., Onset Date: [id number]    Tetracycline Hives    Family History:  Family History  Problem Relation Age of Onset   Sarcoidosis Mother    Colon polyps Mother    Heart attack Father    Hypertension Father    Hyperlipidemia Father    Melanoma Father    Cancer Father    Healthy Brother    Colon cancer Paternal Grandmother    Crohn's disease Neg Hx    Esophageal cancer Neg Hx    Rectal cancer Neg Hx    Stomach cancer Neg Hx      Current Outpatient Medications:    amLODipine  (NORVASC ) 5 MG tablet, TAKE 1 TABLET (5 MG TOTAL) BY MOUTH DAILY., Disp: 90 tablet, Rfl: 0   cetirizine (ZYRTEC) 10 MG tablet, Take 10 mg by mouth daily., Disp: , Rfl:    Cholecalciferol (VITAMIN D ) 50 MCG (2000 UT) tablet, Take 2,000 Units by mouth daily., Disp: , Rfl:    diphenhydrAMINE  (BENADRYL ) 50 MG  tablet, Take one tablet 2 hours (when you are on your way to procedure) prior to heart cath schedule., Disp: 1 tablet, Rfl: 0   levalbuterol  (XOPENEX  HFA) 45 MCG/ACT inhaler, Inhale 2 puffs into the lungs every 6 (six) hours as needed for wheezing., Disp: 1 each, Rfl: 12   Pediatric Multivitamins-Iron (FLINTSTONES PLUS IRON PO), Take 1 tablet by mouth 3 (three) times a week., Disp: , Rfl:    predniSONE  (DELTASONE ) 50 MG tablet, Take one tablet by mouth 13 hours(6:30 pm Wednesday 05/18/22), 7 hours (12:30 am Wednesday 05/19/22) and 2 hours (when you are on your way to procedure) prior to heart cath schedule., Disp: 3 tablet, Rfl: 1  Current Facility-Administered Medications:    sodium chloride  flush (NS) 0.9 % injection 3 mL, 3 mL, Intravenous, Q12H, Nishan, Peter C, MD  Review of Systems:   Negative unless indicated in HPI.   Physical Exam: Vitals:   03/27/23 0753  BP: 130/84  Pulse: 76  Temp: 98.3 F (36.8 C)  TempSrc: Oral  SpO2: 99%  Weight: 171 lb 8 oz (77.8 kg)  Height: 5\' 7"  (1.702 m)    Body mass index is 26.86 kg/m.   Physical Exam Vitals reviewed.  Constitutional:      General: She is not in acute distress.    Appearance: Normal appearance. She is not ill-appearing, toxic-appearing or diaphoretic.  HENT:     Head: Normocephalic.     Right Ear: Tympanic membrane, ear canal and external ear normal. There is no impacted cerumen.     Left Ear: Tympanic membrane, ear canal and external ear normal. There is no impacted cerumen.     Nose: Nose normal.     Mouth/Throat:     Mouth: Mucous membranes are moist.     Pharynx: Oropharynx is clear. No oropharyngeal exudate or posterior oropharyngeal erythema.  Eyes:     General: No scleral icterus.       Right eye: No discharge.        Left eye: No discharge.     Conjunctiva/sclera: Conjunctivae normal.     Pupils: Pupils are equal, round, and reactive to light.  Neck:     Vascular: No carotid bruit.  Cardiovascular:     Rate and Rhythm: Normal rate and regular rhythm.     Pulses: Normal pulses.     Heart sounds: Murmur heard.  Pulmonary:     Effort: Pulmonary effort is normal. No respiratory distress.     Breath sounds: Normal breath sounds.  Abdominal:     General: Abdomen is flat. Bowel sounds are normal.     Palpations: Abdomen is soft.  Musculoskeletal:        General: Normal range of motion.     Cervical back: Normal range of motion.  Skin:    General: Skin is warm and dry.  Neurological:     General: No focal deficit present.     Mental Status: She is alert and oriented to person, place, and time. Mental status is at baseline.  Psychiatric:        Mood and Affect: Mood normal.        Behavior: Behavior normal.        Thought Content: Thought content normal.        Judgment: Judgment  normal.     Flowsheet Row Office Visit from 03/27/2023 in [facility] [place]  PHQ-9 Total Score 0        Impression and Plan:  Encounter  for preventive health examination  Mixed hyperlipidemia -     Lipid panel; Future  Primary hypertension -     CBC with Differential/Platelet; Future -     Comprehensive metabolic panel; Future -     TSH; Future -     Vitamin B12; Future  Vitamin D  deficiency -     VITAMIN D  25 Hydroxy (Vit-D Deficiency, Fractures); Future  Immunization due  Encounter for hepatitis C screening test for low risk patient -     Hepatitis C antibody; Future  Encounter for screening for HIV -     HIV Antibody (routine testing w rflx); Future   -Recommend routine eye and dental care. -Healthy lifestyle discussed in detail. -Labs to be updated today. -Prostate cancer screening: N/A Health Maintenance  Topic Date Due   HIV Screening  Never done   Hepatitis C Screening  Never done   DTaP/Tdap/Td vaccine (3 - Td or Tdap) 10/10/2022   Pneumococcal Vaccination (2 of 2 - PCV) 03/26/2024*   Mammogram  05/23/2023   Pap with HPV screening  03/04/2024   Colon Cancer Screening  09/03/2029   Flu Shot  Completed   COVID-19 Vaccine  Completed   Zoster (Shingles) Vaccine  Completed   HPV Vaccine  Aged Out  *Topic was postponed. The date shown is not the original due date.     -Tdap in office today. -Obtain records from GYN in regards to recent cervical and breast cancer screening. -She will schedule her eye exam as she is overdue.     Marguerita Shih, MD [facility]

## 2023-03-28 LAB — HEPATITIS C ANTIBODY: Hepatitis C Ab: NONREACTIVE

## 2023-03-28 LAB — HIV ANTIBODY (ROUTINE TESTING W REFLEX): HIV 1&2 Ab, 4th Generation: NONREACTIVE

## 2023-03-29 ENCOUNTER — Encounter: Payer: Self-pay | Admitting: Internal Medicine

## 2023-03-29 DIAGNOSIS — L820 Inflamed seborrheic keratosis: Secondary | ICD-10-CM | POA: Diagnosis not present

## 2023-03-29 DIAGNOSIS — L821 Other seborrheic keratosis: Secondary | ICD-10-CM | POA: Diagnosis not present

## 2023-03-29 DIAGNOSIS — D2339 Other benign neoplasm of skin of other parts of face: Secondary | ICD-10-CM | POA: Diagnosis not present

## 2023-03-29 DIAGNOSIS — L718 Other rosacea: Secondary | ICD-10-CM | POA: Diagnosis not present

## 2023-03-29 DIAGNOSIS — L814 Other melanin hyperpigmentation: Secondary | ICD-10-CM | POA: Diagnosis not present

## 2023-03-29 DIAGNOSIS — Z0279 Encounter for issue of other medical certificate: Secondary | ICD-10-CM

## 2023-03-29 DIAGNOSIS — L538 Other specified erythematous conditions: Secondary | ICD-10-CM | POA: Diagnosis not present

## 2023-04-30 ENCOUNTER — Other Ambulatory Visit: Payer: Self-pay | Admitting: Internal Medicine

## 2023-04-30 DIAGNOSIS — I10 Essential (primary) hypertension: Secondary | ICD-10-CM

## 2023-05-19 DIAGNOSIS — Z1231 Encounter for screening mammogram for malignant neoplasm of breast: Secondary | ICD-10-CM | POA: Diagnosis not present

## 2023-05-19 DIAGNOSIS — Z01419 Encounter for gynecological examination (general) (routine) without abnormal findings: Secondary | ICD-10-CM | POA: Diagnosis not present

## 2023-05-19 DIAGNOSIS — Z6827 Body mass index (BMI) 27.0-27.9, adult: Secondary | ICD-10-CM | POA: Diagnosis not present

## 2023-07-25 NOTE — Progress Notes (Signed)
 Cardiology Office Note:  .   Date:  08/07/2023  ID:  Elizabeth Lozano, DOB 1970-09-10, MRN [id number] PCP: Theophilus Andrews, Tully GRADE, MD  [facility] Providers Cardiologist:  Maude Emmer, MD    History of Present Illness: Elizabeth Lozano is a 53 y.o. female  with  History of HTN,  small restrictive peri membranous VSD and palpitations  Monitor 2018 with only atrial ectopy /PVCls nothing sustained or long No beta blocker due to asthma Allergies to cats/mold    TTE 2018 with mild LVH EF 65-70% restrictive VSD Bicuspid AV with no AS/AR Moderate LAE Normal RV size and function     Patient saw Dr. 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 Dr. Emmer said she doesn't need referral to Dr. Lucas and can f/u cardiac MRI in 1 yr.  Patient comes in for f/u. She got married in January. 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 No chest pain, shortness of breath, dizziness. Only occasional palpitations. Patient has a very high deductible and is concerned about costs of another MRI.   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 artery  Risk Assessment/Calculations:             Physical Exam:   VS:  BP 122/80   Pulse 81   Ht 5' 6 (1.676 m)   Wt 173 lb (78.5 kg)    LMP 11/28/2017 (LMP Unknown)   SpO2 98%   BMI 27.92 kg/m    Wt Readings from Last 3 Encounters:  08/07/23 173 lb (78.5 kg)  03/27/23 171 lb 8 oz (77.8 kg)  06/01/22 169 lb (76.7 kg)    GEN: Well nourished, well developed in no acute distress NECK: No JVD; No carotid bruits CARDIAC:  RRR, 4-5/6 systolic murmur LSB RESPIRATORY:  Clear to auscultation without rales, wheezing or rhonchi  ABDOMEN: Soft, non-tender, non-distended EXTREMITIES:  No edema; No deformity   ASSESSMENT AND PLAN: .    VSD: 2018 when VSD seemed restrictive with normal RV TTE 05/03/22 concerning for further degeneration of membranous septum with large shunt Dr. Emmer discussed with Dr Lucas and  right and left cath with shunt run done 05/2022. Cath normal cors, normal RH pressures, normal Oxygen sat run, plan to do cardiac MRI in 1 yr. She's worried about the cost of this so will discuss timeline of MRI's with  Dr. Emmer.   Palpitations :  benign and improve with Magnesium supplement   HTN: controlled on norvasc     HLD:  LDL 143 03/2023, she does have family history of CAD but would like to try to get it down on her own rather than start a statin. With normal coronary arteries on cath this  is reasonable approach.          Dispo: f/u Dr. Nishan in 1 yr.  Signed, Olivia Pavy, PA-C

## 2023-08-07 ENCOUNTER — Ambulatory Visit: Attending: Physician Assistant | Admitting: Physician Assistant

## 2023-08-07 ENCOUNTER — Encounter: Payer: Self-pay | Admitting: Physician Assistant

## 2023-08-07 VITALS — BP 122/80 | HR 81 | Ht 66.0 in | Wt 173.0 lb

## 2023-08-07 DIAGNOSIS — Q2542 Hypoplasia of aorta: Secondary | ICD-10-CM

## 2023-08-07 DIAGNOSIS — Q210 Ventricular septal defect: Secondary | ICD-10-CM | POA: Diagnosis not present

## 2023-08-07 DIAGNOSIS — R002 Palpitations: Secondary | ICD-10-CM | POA: Diagnosis not present

## 2023-08-07 DIAGNOSIS — E782 Mixed hyperlipidemia: Secondary | ICD-10-CM

## 2023-08-07 DIAGNOSIS — I10 Essential (primary) hypertension: Secondary | ICD-10-CM

## 2023-08-07 NOTE — Patient Instructions (Signed)
 Medication Instructions:   Your physician recommends that you continue on your current medications as directed. Please refer to the Current Medication list given to you today.  *If you need a refill on your cardiac medications before your next appointment, please call your pharmacy*    Lab Work: NONE ORDERED  TODAY     If you have labs (blood work) drawn today and your tests are completely normal, you will receive your results only by: MyChart Message (if you have MyChart) OR A paper copy in the mail If you have any lab test that is abnormal or we need to change your treatment, we will call you to review the results.    Testing/Procedures: NONE ORDERED  TODAY     Follow-Up: At [facility], you and your health needs are our priority.  As part of our continuing mission to provide you with exceptional heart care, our providers are all part of one team.  This team includes your primary Cardiologist (physician) and Advanced Practice Providers or APPs (Physician Assistants and Nurse Practitioners) who all work together to provide you with the care you need, when you need it.   Your next appointment:    1 year(s)   Provider:    Maude Emmer, MD     We recommend signing up for the patient portal called MyChart.  Sign up information is provided on this After Visit Summary.  MyChart is used to connect with patients for Virtual Visits (Telemedicine).  Patients are able to view lab/test results, encounter notes, upcoming appointments, etc.  Non-urgent messages can be sent to your provider as well.   To learn more about what you can do with MyChart, go to [email or web address].   Other Instructions

## 2023-08-10 ENCOUNTER — Telehealth: Payer: Self-pay

## 2023-08-10 NOTE — Telephone Encounter (Signed)
-----   Message from Olivia Pavy sent at 08/10/2023  7:41 AM EDT ----- Regarding: FW: Can you let patient know Dr. Delford says she doesn't need an MRI this year and he'll see her back in 1 year.  Thanks, Olivia ----- Message ----- From: Delford Maude BROCKS, MD Sent: 08/09/2023   5:08 PM EDT To: Olivia CHRISTELLA Pavy, PA-C Subject: RE:                                            I can see her in a year ----- Message ----- From: Pavy Olivia CHRISTELLA DEVONNA Sent: 08/07/2023  11:35 AM EDT To: Maude BROCKS Delford, MD  Dr. Delford,  Can you review this patient? You wanted yearly MRI's on her for her VSD. She has a $10,000 deductible and doesn't want to have it done yearly unless you say it's absolutely necessary. She's asymptomatic but very loud murmur.  Thanks for your input. Olivia

## 2023-08-10 NOTE — Telephone Encounter (Signed)
 Spoke with patient and made her aware that she doesn't need a MRI and will need to follow-up in one year. Patient verbalized understanding and thanked me for the call.

## 2023-08-11 DIAGNOSIS — H5213 Myopia, bilateral: Secondary | ICD-10-CM | POA: Diagnosis not present

## 2023-08-11 DIAGNOSIS — H2513 Age-related nuclear cataract, bilateral: Secondary | ICD-10-CM | POA: Diagnosis not present

## 2023-08-11 DIAGNOSIS — H52223 Regular astigmatism, bilateral: Secondary | ICD-10-CM | POA: Diagnosis not present

## 2023-08-11 DIAGNOSIS — H524 Presbyopia: Secondary | ICD-10-CM | POA: Diagnosis not present

## 2023-08-28 ENCOUNTER — Ambulatory Visit (INDEPENDENT_AMBULATORY_CARE_PROVIDER_SITE_OTHER): Admitting: Podiatry

## 2023-08-28 VITALS — Ht 66.0 in | Wt 173.0 lb

## 2023-08-28 DIAGNOSIS — B351 Tinea unguium: Secondary | ICD-10-CM

## 2023-08-28 MED ORDER — EFINACONAZOLE 10 % EX SOLN: 1.0000 [drp] | Freq: Every day | CUTANEOUS | 11 refills | Status: AC

## 2023-08-28 NOTE — Patient Instructions (Signed)
 Efinaconazole Topical solution What is this medication? EFINACONAZOLE (e FEE na KON a zole) treats fungal infections of the nails. It belongs to a group of medications called antifungals. It will not treat infections caused by bacteria or viruses. This medicine may be used for other purposes; ask your health care provider or pharmacist if you have questions. COMMON BRAND NAME(S): JUBLIA What should I tell my care team before I take this medication? They need to know if you have any of these conditions: An unusual or allergic reaction to efinaconazole, other medications, foods, dyes or preservatives Pregnant or trying to get pregnant Breast-feeding How should I use this medication? This medication is for external use only. Do not take by mouth. Wash your hands before and after use. Do not get it in your eyes. If you do, rinse your eyes with plenty of cool tap water. Use it as directed on the prescription label. Do not use it more often than directed. Use the medication for the full course as directed by your care team, even if you think you are better. Do not stop using it unless your care team tells you to stop it early. This medication comes with INSTRUCTIONS FOR USE. Ask your pharmacist for directions on how to use this medication. Read the information carefully. Talk to your pharmacist or care team if you have questions. Talk to your care team about the use of this medication in children. While it may be prescribed for children as young as 6 years for selected conditions, precautions do apply. Overdosage: If you think you have taken too much of this medicine contact a poison control center or emergency room at once. NOTE: This medicine is only for you. Do not share this medicine with others. What if I miss a dose? If you miss a dose, use it as soon as you can. If it is almost time for your next dose, use only that dose. Do not use double or extra doses. What may interact with this  medication? Interactions are not expected. Do not use any other skin products on the same area of skin without talking to your care team. This list may not describe all possible interactions. Give your health care provider a list of all the medicines, herbs, non-prescription drugs, or dietary supplements you use. Also tell them if you smoke, drink alcohol, or use illegal drugs. Some items may interact with your medicine. What should I watch for while using this medication? Visit your care team for regular checks on your progress. It may be some time before you see the benefit from this medication. Do not use nail polish or other nail cosmetic products on the treated nails. What side effects may I notice from receiving this medication? Side effects that you should report to your care team as soon as possible: Allergic reactions--skin rash, itching, hives, swelling of the face, lips, tongue, or throat Side effects that usually do not require medical attention (report to your care team if they continue or are bothersome): Ingrown nails Mild skin irritation, redness, or dryness This list may not describe all possible side effects. Call your doctor for medical advice about side effects. You may report side effects to FDA at 1-800-FDA-1088. Where should I keep my medication? Keep out of the reach of children and pets. Store at room temperature between 20 and 25 degrees C (68 and 77 degrees F). Do not freeze. Keep the container tightly closed. Get rid of any unused medication after the expiration date.  This medication is flammable. Avoid exposure to heat, flame, and smoking. To get rid of medications that are no longer needed or have expired: Take the medication to a medication take-back program. Check with your pharmacy or law enforcement to find a location. If you cannot return the medication, ask your pharmacist or care team how to get rid of this medication safely. NOTE: This sheet is a summary. It  may not cover all possible information. If you have questions about this medicine, talk to your doctor, pharmacist, or health care provider.  2024 Elsevier/Gold Standard (2021-04-12 00:00:00)

## 2023-08-29 NOTE — Progress Notes (Signed)
  Subjective:  Patient ID: Elizabeth Lozano, female    DOB: 1970/08/14,  MRN: [id number]  Chief Complaint  Patient presents with   Nail Problem    RM 13 Patient is here for bilateral onychomycosis. Patient states fungus has been present since childhood.Patient has tried Fungi Nail for over a 12 month period with no success. Patient would like to discuss to the use of medicated nail polish for treatment.    Discussed the use of AI scribe software for clinical note transcription with the patient, who gave verbal consent to proceed.  History of Present Illness Elizabeth Lozano is a 53 year old female who presents with chronic nail fungus.  The nail fungus began on her right foot following a cut at age [age] and remained stable until it spread to other toes about fifteen years ago. She has avoided oral medications due to liver health concerns and has tried home remedies and Bungie Nail, which provided slight improvement. She is interested in non-oral treatments like medicated toenail polish.  There is no discomfort, infections, drainage, or itching associated with the nail fungus. Occasionally, she experiences foot fungus during the summer, but it does not affect the original foot with the nail fungus. Her father has had a similar condition throughout his life.      Objective:    Physical Exam General: AAO x3, NAD  Dermatological: Nails appear be hypertrophic, dystrophic with yellow discoloration.  There is no edema, erythema or any signs of infection noted.  No open lesions.  Vascular: Dorsalis Pedis artery and Posterior Tibial artery pedal pulses are 2/4 bilateral with immedate capillary fill time. There is no pain with calf compression, swelling, warmth, erythema.   Neruologic: Grossly intact via light touch bilateral.   Musculoskeletal: No pain on exam.  Gait: Unassisted, Nonantalgic.      No images are attached to the encounter.    Results     Assessment:   1. Dermatophytosis of nail       Plan:  Patient was evaluated and treated and all questions answered.  Assessment and Plan Assessment & Plan Onychomycosis Chronic onychomycosis managed conservatively due to liver side effect concerns with oral antifungals. Interested in non-oral treatments. - Prescribed Jublia  (efinaconazole ) topical solution.  - Coordinate with [facility] for insurance coverage verification for Jublia . Advised her to contact provider for coverage or cost issues. - Discussed potential side effects of Jublia , including skin irritation and allergic reactions. Advised discontinuation and contact provider if redness, irritation, or blisters occur. - Scheduled monthly laser treatment sessions for 3-5 months. Explained laser treatment costs $100 per session and is not covered by insurance. - Advised combining laser treatment with topical therapy for enhanced efficacy.  Return for nail laser.   [name] DPM

## 2023-09-19 ENCOUNTER — Other Ambulatory Visit: Payer: Self-pay

## 2023-10-06 ENCOUNTER — Ambulatory Visit (INDEPENDENT_AMBULATORY_CARE_PROVIDER_SITE_OTHER): Payer: Self-pay

## 2023-10-06 DIAGNOSIS — B351 Tinea unguium: Secondary | ICD-10-CM

## 2023-10-06 NOTE — Progress Notes (Signed)
 Patient presents today for the 1st laser treatment. Diagnosed with mycotic nail infection by Dr. Gershon.  Toenail most affected 1-5 bilateral, infection has been present since childhood.  All other systems are negative.  Nail were filed thin. Laser therapy was administered to 1-5 toenails bilateral and patient tolerated the treatment well. All safety precautions were in place.  Follow-up in 4 weeks for laser treatment # 2.

## 2023-10-17 ENCOUNTER — Other Ambulatory Visit

## 2023-10-29 ENCOUNTER — Other Ambulatory Visit: Payer: Self-pay | Admitting: Internal Medicine

## 2023-10-29 DIAGNOSIS — I10 Essential (primary) hypertension: Secondary | ICD-10-CM

## 2023-11-03 ENCOUNTER — Ambulatory Visit (INDEPENDENT_AMBULATORY_CARE_PROVIDER_SITE_OTHER): Payer: Self-pay

## 2023-11-03 DIAGNOSIS — B351 Tinea unguium: Secondary | ICD-10-CM

## 2023-11-03 NOTE — Progress Notes (Signed)
 Patient presents today for the 2nd laser treatment. Diagnosed with mycotic nail infection by Dr. Gershon.   Toenail most affected 1-5 bilaterally.  All other systems are negative.  Nails were filed thin. Laser therapy was administered to 1-5 toenails bilaterally and patient tolerated the treatment well. All safety precautions were in place.   Post treatment instructions reviewed and provided to patient. Patient had no questions regarding plan of care.   Follow up in 4 weeks for laser # 3.

## 2023-11-14 ENCOUNTER — Other Ambulatory Visit

## 2023-11-17 ENCOUNTER — Telehealth: Payer: Self-pay | Admitting: Internal Medicine

## 2023-11-17 NOTE — Telephone Encounter (Signed)
 LVM to let pt know that Elizabeth Lozano does not work on Fridays and that she will be back in the office on Monday.   Copied from CRM [id number]. Topic: Clinical - Medication Question >> Nov 17, 2023  3:50 PM Elizabeth Lozano wrote: Reason for CRM: Patient called and stated that she just had dental surgery and is requesting for Dr. Theophilus to prescribed an antibiotic due to her heart condition because the dentist who did the surgery only prescribed medication for nausea. She only wants enough to hold her til Monday when she can get back in contact with her dentist.  Please send medication to [facility]

## 2023-11-28 ENCOUNTER — Ambulatory Visit (INDEPENDENT_AMBULATORY_CARE_PROVIDER_SITE_OTHER): Payer: Self-pay

## 2023-11-28 DIAGNOSIS — B351 Tinea unguium: Secondary | ICD-10-CM

## 2023-11-28 NOTE — Progress Notes (Signed)
 Patient presents today for the 3rd laser treatment. Diagnosed with mycotic nail infection by Dr. Gershon.   Toenail most affected 1-5 bilaterally.  All other systems are negative.  Nails were filed thin. Laser therapy was administered to 1-5 toenails bilaterally and patient tolerated the treatment well. All safety precautions were in place.   Post treatment instructions reviewed and provided to patient. Patient had no questions regarding plan of care.   Follow up in 6 weeks for laser # 4.

## 2024-01-26 ENCOUNTER — Ambulatory Visit: Payer: Self-pay

## 2024-01-26 DIAGNOSIS — B351 Tinea unguium: Secondary | ICD-10-CM

## 2024-01-26 NOTE — Progress Notes (Signed)
 Patient presents today for the 4th laser treatment. Diagnosed with mycotic nail infection by Dr. Gershon.   Toenail most affected 1-5 bilaterally. Patient reports improvement with nail appearance. The nails are not as thick and they are clearing at the base.   All other systems are negative.  Nails were filed thin. Laser therapy was administered to 1-5 toenails bilaterally and patient tolerated the treatment well. All safety precautions were in place.   Post treatment instructions reviewed and provided to patient. Patient had no questions regarding plan of care.   Follow up in 6 weeks for laser # 5.

## 2024-03-19 ENCOUNTER — Ambulatory Visit (INDEPENDENT_AMBULATORY_CARE_PROVIDER_SITE_OTHER)

## 2024-03-19 DIAGNOSIS — B351 Tinea unguium: Secondary | ICD-10-CM

## 2024-03-19 NOTE — Progress Notes (Unsigned)
 Patient presents today for the 5th laser treatment. Diagnosed with mycotic nail infection by Dr. Gershon.   Toenail most affected 1-5 bilaterally.  All other systems are negative.  Nails were filed thin. Laser therapy was administered to 1-5 toenails bilaterally and patient tolerated the treatment well. All safety precautions were in place.   Post treatment instructions reviewed and provided to patient. Patient had no questions regarding plan of care.   Follow up in 8 weeks for laser # 6.  -  Patient was seen by CMA for the procedure.  I did not see the patient during this appointment.

## 2024-03-19 NOTE — Progress Notes (Deleted)
 Patient presents today for the *** laser treatment. Diagnosed with mycotic nail infection by Dr. FERNAND   Toenail most affected ***.  All other systems are negative.  Nails were filed thin. Laser therapy was administered to *** toenails *** and patient tolerated the treatment well. All safety precautions were in place.   Post treatment instructions reviewed and provided to patient. Patient had no questions regarding plan of care.   Follow up in *** weeks for laser # ***.

## 2024-04-17 ENCOUNTER — Encounter: Admitting: Internal Medicine

## 2024-05-14 ENCOUNTER — Ambulatory Visit
